# Patient Record
Sex: Male | Born: 1950 | Race: White | Hispanic: No | Smoking: Former smoker
Health system: Southern US, Community
[De-identification: ages and names within clinical notes are randomized; demographics above are authoritative.]

## PROBLEM LIST (undated history)

## (undated) DIAGNOSIS — E785 Hyperlipidemia, unspecified: Secondary | ICD-10-CM

## (undated) DIAGNOSIS — I10 Essential (primary) hypertension: Secondary | ICD-10-CM

## (undated) DIAGNOSIS — E669 Obesity, unspecified: Secondary | ICD-10-CM

## (undated) DIAGNOSIS — J9610 Chronic respiratory failure, unspecified whether with hypoxia or hypercapnia: Secondary | ICD-10-CM

## (undated) DIAGNOSIS — N183 Chronic kidney disease, stage 3 unspecified: Secondary | ICD-10-CM

## (undated) DIAGNOSIS — I272 Pulmonary hypertension, unspecified: Secondary | ICD-10-CM

## (undated) DIAGNOSIS — N289 Disorder of kidney and ureter, unspecified: Secondary | ICD-10-CM

## (undated) DIAGNOSIS — J984 Other disorders of lung: Secondary | ICD-10-CM

## (undated) DIAGNOSIS — M1990 Unspecified osteoarthritis, unspecified site: Secondary | ICD-10-CM

## (undated) DIAGNOSIS — I2510 Atherosclerotic heart disease of native coronary artery without angina pectoris: Secondary | ICD-10-CM

## (undated) DIAGNOSIS — F419 Anxiety disorder, unspecified: Secondary | ICD-10-CM

## (undated) DIAGNOSIS — I5022 Chronic systolic (congestive) heart failure: Secondary | ICD-10-CM

## (undated) DIAGNOSIS — Z86718 Personal history of other venous thrombosis and embolism: Secondary | ICD-10-CM

## (undated) DIAGNOSIS — E039 Hypothyroidism, unspecified: Secondary | ICD-10-CM

## (undated) DIAGNOSIS — I255 Ischemic cardiomyopathy: Secondary | ICD-10-CM

## (undated) DIAGNOSIS — E1140 Type 2 diabetes mellitus with diabetic neuropathy, unspecified: Secondary | ICD-10-CM

## (undated) DIAGNOSIS — D649 Anemia, unspecified: Secondary | ICD-10-CM

## (undated) DIAGNOSIS — J449 Chronic obstructive pulmonary disease, unspecified: Secondary | ICD-10-CM

## (undated) DIAGNOSIS — G4733 Obstructive sleep apnea (adult) (pediatric): Secondary | ICD-10-CM

## (undated) DIAGNOSIS — Z720 Tobacco use: Secondary | ICD-10-CM

## (undated) DIAGNOSIS — I4891 Unspecified atrial fibrillation: Secondary | ICD-10-CM

## (undated) HISTORY — DX: Personal history of other venous thrombosis and embolism: Z86.718

## (undated) HISTORY — DX: Unspecified atrial fibrillation: I48.91

## (undated) HISTORY — DX: Tobacco use: Z72.0

## (undated) HISTORY — DX: Anemia, unspecified: D64.9

## (undated) HISTORY — DX: Chronic obstructive pulmonary disease, unspecified: J44.9

## (undated) HISTORY — DX: Obesity, unspecified: E66.9

## (undated) HISTORY — DX: Ischemic cardiomyopathy: I25.5

## (undated) HISTORY — PX: SPINAL FUSION: SHX223

## (undated) HISTORY — DX: Anxiety disorder, unspecified: F41.9

## (undated) HISTORY — DX: Chronic kidney disease, stage 3 unspecified: N18.30

## (undated) HISTORY — DX: Hypothyroidism, unspecified: E03.9

## (undated) HISTORY — DX: Type 2 diabetes mellitus with diabetic neuropathy, unspecified: E11.40

## (undated) HISTORY — DX: Essential (primary) hypertension: I10

## (undated) HISTORY — DX: Pulmonary hypertension, unspecified: I27.20

## (undated) HISTORY — PX: CORONARY ARTERY BYPASS GRAFT: SHX141

## (undated) HISTORY — DX: Chronic systolic (congestive) heart failure: I50.22

## (undated) HISTORY — DX: Other disorders of lung: J98.4

## (undated) HISTORY — DX: Unspecified osteoarthritis, unspecified site: M19.90

## (undated) HISTORY — DX: Obstructive sleep apnea (adult) (pediatric): G47.33

## (undated) HISTORY — DX: Chronic kidney disease, stage 3 (moderate): N18.3

## (undated) HISTORY — DX: Chronic respiratory failure, unspecified whether with hypoxia or hypercapnia: J96.10

## (undated) HISTORY — DX: Hyperlipidemia, unspecified: E78.5

## (undated) HISTORY — DX: Atherosclerotic heart disease of native coronary artery without angina pectoris: I25.10

---
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
## 1999-03-25 ENCOUNTER — Inpatient Hospital Stay (HOSPITAL_COMMUNITY): Admission: AD | Admit: 1999-03-25 | Discharge: 1999-03-28 | Payer: Self-pay | Admitting: *Deleted

## 2000-03-11 ENCOUNTER — Encounter: Payer: Self-pay | Admitting: Internal Medicine

## 2000-03-11 ENCOUNTER — Ambulatory Visit (HOSPITAL_COMMUNITY): Admission: RE | Admit: 2000-03-11 | Discharge: 2000-03-11 | Payer: Self-pay | Admitting: Internal Medicine

## 2004-01-21 ENCOUNTER — Inpatient Hospital Stay (HOSPITAL_COMMUNITY): Admission: AD | Admit: 2004-01-21 | Discharge: 2004-01-28 | Payer: Self-pay | Admitting: *Deleted

## 2004-01-23 ENCOUNTER — Encounter: Payer: Self-pay | Admitting: Internal Medicine

## 2004-06-19 ENCOUNTER — Ambulatory Visit: Payer: Self-pay | Admitting: Cardiology

## 2004-09-27 HISTORY — PX: OTHER SURGICAL HISTORY: SHX169

## 2004-10-02 ENCOUNTER — Ambulatory Visit: Payer: Self-pay | Admitting: Internal Medicine

## 2004-10-02 ENCOUNTER — Inpatient Hospital Stay (HOSPITAL_COMMUNITY): Admission: AD | Admit: 2004-10-02 | Discharge: 2004-10-08 | Payer: Self-pay | Admitting: Surgery

## 2004-10-02 ENCOUNTER — Encounter (INDEPENDENT_AMBULATORY_CARE_PROVIDER_SITE_OTHER): Payer: Self-pay | Admitting: *Deleted

## 2005-07-01 ENCOUNTER — Ambulatory Visit: Payer: Self-pay | Admitting: Internal Medicine

## 2005-07-06 ENCOUNTER — Inpatient Hospital Stay (HOSPITAL_COMMUNITY): Admission: EM | Admit: 2005-07-06 | Discharge: 2005-07-20 | Payer: Self-pay | Admitting: Emergency Medicine

## 2005-07-06 ENCOUNTER — Ambulatory Visit: Payer: Self-pay | Admitting: Endocrinology

## 2005-07-07 ENCOUNTER — Ambulatory Visit: Payer: Self-pay | Admitting: Internal Medicine

## 2005-07-07 ENCOUNTER — Ambulatory Visit: Payer: Self-pay | Admitting: Cardiology

## 2005-07-07 ENCOUNTER — Encounter: Payer: Self-pay | Admitting: Cardiology

## 2005-07-27 ENCOUNTER — Ambulatory Visit: Payer: Self-pay | Admitting: Endocrinology

## 2005-08-10 ENCOUNTER — Ambulatory Visit: Payer: Self-pay | Admitting: Family Medicine

## 2005-08-12 ENCOUNTER — Ambulatory Visit: Payer: Self-pay | Admitting: Endocrinology

## 2005-08-12 ENCOUNTER — Ambulatory Visit: Payer: Self-pay | Admitting: Internal Medicine

## 2005-08-17 ENCOUNTER — Ambulatory Visit: Payer: Self-pay | Admitting: Endocrinology

## 2005-09-07 ENCOUNTER — Ambulatory Visit (HOSPITAL_COMMUNITY): Admission: RE | Admit: 2005-09-07 | Discharge: 2005-09-07 | Payer: Self-pay | Admitting: Family Medicine

## 2005-09-07 ENCOUNTER — Inpatient Hospital Stay (HOSPITAL_COMMUNITY): Admission: EM | Admit: 2005-09-07 | Discharge: 2005-09-08 | Payer: Self-pay | Admitting: Emergency Medicine

## 2005-09-07 ENCOUNTER — Ambulatory Visit: Payer: Self-pay | Admitting: Family Medicine

## 2005-09-07 ENCOUNTER — Ambulatory Visit: Payer: Self-pay | Admitting: Sports Medicine

## 2005-09-09 ENCOUNTER — Ambulatory Visit: Payer: Self-pay | Admitting: Cardiology

## 2005-09-16 ENCOUNTER — Ambulatory Visit: Payer: Self-pay | Admitting: Family Medicine

## 2005-09-22 ENCOUNTER — Ambulatory Visit: Payer: Self-pay | Admitting: Endocrinology

## 2005-09-27 DIAGNOSIS — I2510 Atherosclerotic heart disease of native coronary artery without angina pectoris: Secondary | ICD-10-CM

## 2005-09-27 HISTORY — DX: Atherosclerotic heart disease of native coronary artery without angina pectoris: I25.10

## 2005-12-23 ENCOUNTER — Ambulatory Visit: Payer: Self-pay | Admitting: Pulmonary Disease

## 2006-01-24 ENCOUNTER — Inpatient Hospital Stay (HOSPITAL_COMMUNITY): Admission: EM | Admit: 2006-01-24 | Discharge: 2006-02-02 | Payer: Self-pay | Admitting: *Deleted

## 2006-01-24 ENCOUNTER — Ambulatory Visit: Payer: Self-pay | Admitting: Family Medicine

## 2006-01-24 ENCOUNTER — Ambulatory Visit: Payer: Self-pay | Admitting: Cardiology

## 2006-01-28 ENCOUNTER — Encounter: Payer: Self-pay | Admitting: Cardiology

## 2006-02-08 ENCOUNTER — Ambulatory Visit: Payer: Self-pay | Admitting: Sports Medicine

## 2006-02-15 ENCOUNTER — Ambulatory Visit: Payer: Self-pay | Admitting: Cardiology

## 2006-02-22 ENCOUNTER — Ambulatory Visit: Payer: Self-pay | Admitting: Sports Medicine

## 2006-03-23 ENCOUNTER — Ambulatory Visit: Payer: Self-pay | Admitting: Family Medicine

## 2006-04-22 ENCOUNTER — Ambulatory Visit: Payer: Self-pay | Admitting: Pulmonary Disease

## 2006-05-02 ENCOUNTER — Ambulatory Visit: Payer: Self-pay | Admitting: Cardiology

## 2006-06-02 ENCOUNTER — Ambulatory Visit: Payer: Self-pay | Admitting: Cardiology

## 2006-09-07 ENCOUNTER — Ambulatory Visit: Payer: Self-pay | Admitting: Internal Medicine

## 2006-09-30 ENCOUNTER — Ambulatory Visit: Payer: Self-pay | Admitting: Internal Medicine

## 2006-10-08 ENCOUNTER — Ambulatory Visit: Payer: Self-pay | Admitting: Internal Medicine

## 2006-10-08 ENCOUNTER — Inpatient Hospital Stay (HOSPITAL_COMMUNITY): Admission: EM | Admit: 2006-10-08 | Discharge: 2006-10-13 | Payer: Self-pay | Admitting: Emergency Medicine

## 2006-10-10 ENCOUNTER — Encounter: Payer: Self-pay | Admitting: Internal Medicine

## 2006-11-24 DIAGNOSIS — I10 Essential (primary) hypertension: Secondary | ICD-10-CM

## 2006-11-24 DIAGNOSIS — E118 Type 2 diabetes mellitus with unspecified complications: Secondary | ICD-10-CM

## 2006-11-24 DIAGNOSIS — E1165 Type 2 diabetes mellitus with hyperglycemia: Secondary | ICD-10-CM

## 2006-11-24 DIAGNOSIS — E039 Hypothyroidism, unspecified: Secondary | ICD-10-CM | POA: Insufficient documentation

## 2007-02-10 ENCOUNTER — Ambulatory Visit: Payer: Self-pay | Admitting: Cardiology

## 2007-02-24 ENCOUNTER — Ambulatory Visit: Payer: Self-pay

## 2007-02-28 ENCOUNTER — Ambulatory Visit: Payer: Self-pay

## 2007-03-09 ENCOUNTER — Ambulatory Visit: Payer: Self-pay | Admitting: Cardiology

## 2007-04-15 ENCOUNTER — Emergency Department (HOSPITAL_COMMUNITY): Admission: EM | Admit: 2007-04-15 | Discharge: 2007-04-15 | Payer: Self-pay | Admitting: Emergency Medicine

## 2007-05-09 ENCOUNTER — Ambulatory Visit: Payer: Self-pay | Admitting: Cardiology

## 2007-05-09 LAB — CONVERTED CEMR LAB
BUN: 13 mg/dL
CO2: 31 meq/L
Calcium: 9 mg/dL
Chloride: 108 meq/L
Creatinine, Ser: 0.9 mg/dL
GFR calc Af Amer: 112 mL/min
GFR calc non Af Amer: 92 mL/min
Glucose, Bld: 169 mg/dL — ABNORMAL HIGH
Potassium: 4 meq/L
Sodium: 144 meq/L

## 2007-08-03 ENCOUNTER — Ambulatory Visit: Payer: Self-pay | Admitting: Cardiology

## 2007-08-10 ENCOUNTER — Ambulatory Visit: Payer: Self-pay | Admitting: Internal Medicine

## 2007-08-10 LAB — CONVERTED CEMR LAB
Basophils Relative: 2.2 % — ABNORMAL HIGH (ref 0.0–1.0)
CO2: 32 meq/L (ref 19–32)
Creatinine, Ser: 1 mg/dL (ref 0.4–1.5)
Glucose, Bld: 193 mg/dL — ABNORMAL HIGH (ref 70–99)
HCT: 38.7 % — ABNORMAL LOW (ref 39.0–52.0)
Hemoglobin: 13.6 g/dL (ref 13.0–17.0)
Monocytes Absolute: 0.4 10*3/uL (ref 0.2–0.7)
Neutrophils Relative %: 77.1 % — ABNORMAL HIGH (ref 43.0–77.0)
Potassium: 4.5 meq/L (ref 3.5–5.1)
RDW: 12.9 % (ref 11.5–14.6)
Sodium: 142 meq/L (ref 135–145)

## 2008-05-07 ENCOUNTER — Encounter: Payer: Self-pay | Admitting: *Deleted

## 2008-05-12 ENCOUNTER — Ambulatory Visit: Payer: Self-pay | Admitting: Cardiovascular Disease

## 2008-05-12 ENCOUNTER — Ambulatory Visit: Payer: Self-pay | Admitting: Family Medicine

## 2008-05-13 ENCOUNTER — Inpatient Hospital Stay (HOSPITAL_COMMUNITY): Admission: EM | Admit: 2008-05-13 | Discharge: 2008-05-15 | Payer: Self-pay | Admitting: Emergency Medicine

## 2008-05-14 ENCOUNTER — Telehealth (INDEPENDENT_AMBULATORY_CARE_PROVIDER_SITE_OTHER): Payer: Self-pay | Admitting: Family Medicine

## 2008-05-15 ENCOUNTER — Encounter: Payer: Self-pay | Admitting: Family Medicine

## 2008-05-16 ENCOUNTER — Telehealth: Payer: Self-pay | Admitting: *Deleted

## 2008-05-22 ENCOUNTER — Ambulatory Visit: Payer: Self-pay | Admitting: Family Medicine

## 2008-05-30 ENCOUNTER — Ambulatory Visit: Payer: Self-pay | Admitting: Cardiology

## 2008-06-07 ENCOUNTER — Encounter: Payer: Self-pay | Admitting: Internal Medicine

## 2008-06-19 ENCOUNTER — Encounter (INDEPENDENT_AMBULATORY_CARE_PROVIDER_SITE_OTHER): Payer: Self-pay | Admitting: Family Medicine

## 2008-06-19 ENCOUNTER — Ambulatory Visit: Payer: Self-pay | Admitting: Family Medicine

## 2008-06-19 DIAGNOSIS — E785 Hyperlipidemia, unspecified: Secondary | ICD-10-CM

## 2008-06-20 ENCOUNTER — Encounter (INDEPENDENT_AMBULATORY_CARE_PROVIDER_SITE_OTHER): Payer: Self-pay | Admitting: Family Medicine

## 2008-06-21 LAB — CONVERTED CEMR LAB
HDL: 26 mg/dL — ABNORMAL LOW (ref >39)
LDL Cholesterol: 61 mg/dL (ref 0–99)
Triglycerides: 108 mg/dL (ref <150)
VLDL: 22 mg/dL (ref 0–40)

## 2008-06-27 ENCOUNTER — Encounter (INDEPENDENT_AMBULATORY_CARE_PROVIDER_SITE_OTHER): Payer: Self-pay | Admitting: Family Medicine

## 2008-09-17 ENCOUNTER — Encounter (INDEPENDENT_AMBULATORY_CARE_PROVIDER_SITE_OTHER): Payer: Self-pay | Admitting: Family Medicine

## 2008-09-23 ENCOUNTER — Encounter (INDEPENDENT_AMBULATORY_CARE_PROVIDER_SITE_OTHER): Payer: Self-pay | Admitting: Family Medicine

## 2008-09-23 ENCOUNTER — Ambulatory Visit: Payer: Self-pay | Admitting: Family Medicine

## 2008-09-23 LAB — CONVERTED CEMR LAB
Albumin: 3.9 g/dL (ref 3.5–5.2)
Alkaline Phosphatase: 103 units/L (ref 39–117)
BUN: 29 mg/dL — ABNORMAL HIGH (ref 6–23)
Glucose, Bld: 502 mg/dL (ref 70–99)
Potassium: 5.2 meq/L (ref 3.5–5.3)
Total Bilirubin: 0.4 mg/dL (ref 0.3–1.2)

## 2008-10-01 ENCOUNTER — Encounter (INDEPENDENT_AMBULATORY_CARE_PROVIDER_SITE_OTHER): Payer: Self-pay | Admitting: Family Medicine

## 2008-10-17 ENCOUNTER — Ambulatory Visit: Payer: Self-pay | Admitting: Cardiology

## 2008-12-25 ENCOUNTER — Ambulatory Visit: Payer: Self-pay | Admitting: Internal Medicine

## 2008-12-25 ENCOUNTER — Inpatient Hospital Stay (HOSPITAL_COMMUNITY): Admission: EM | Admit: 2008-12-25 | Discharge: 2009-01-09 | Payer: Self-pay | Admitting: Emergency Medicine

## 2009-01-03 ENCOUNTER — Ambulatory Visit: Payer: Self-pay | Admitting: *Deleted

## 2009-01-03 ENCOUNTER — Encounter: Payer: Self-pay | Admitting: Critical Care Medicine

## 2009-01-10 ENCOUNTER — Ambulatory Visit: Payer: Self-pay | Admitting: Internal Medicine

## 2009-01-14 ENCOUNTER — Ambulatory Visit: Payer: Self-pay | Admitting: Cardiology

## 2009-01-14 LAB — CONVERTED CEMR LAB
GFR calc non Af Amer: 104.92 mL/min (ref >60)
Glucose, Bld: 141 mg/dL — ABNORMAL HIGH (ref 70–99)
Potassium: 4.3 meq/L (ref 3.5–5.1)
Sodium: 144 meq/L (ref 135–145)

## 2009-01-21 DIAGNOSIS — I2510 Atherosclerotic heart disease of native coronary artery without angina pectoris: Secondary | ICD-10-CM | POA: Insufficient documentation

## 2009-01-21 DIAGNOSIS — I2589 Other forms of chronic ischemic heart disease: Secondary | ICD-10-CM

## 2009-01-22 ENCOUNTER — Ambulatory Visit: Payer: Self-pay | Admitting: Cardiology

## 2009-01-22 ENCOUNTER — Encounter: Payer: Self-pay | Admitting: Physician Assistant

## 2009-01-22 DIAGNOSIS — R079 Chest pain, unspecified: Secondary | ICD-10-CM | POA: Insufficient documentation

## 2009-01-22 DIAGNOSIS — I82409 Acute embolism and thrombosis of unspecified deep veins of unspecified lower extremity: Secondary | ICD-10-CM | POA: Insufficient documentation

## 2009-01-22 LAB — CONVERTED CEMR LAB
CO2: 29 meq/L (ref 19–32)
Chloride: 110 meq/L (ref 96–112)
Creatinine, Ser: 0.9 mg/dL (ref 0.4–1.5)
Nitrite: NEGATIVE
Sodium: 146 meq/L — ABNORMAL HIGH (ref 135–145)
Specific Gravity, Urine: 1.02 (ref 1.000–1.030)
Total Protein, Urine: 100 mg/dL
pH: 5 (ref 5.0–8.0)

## 2009-01-23 ENCOUNTER — Telehealth: Payer: Self-pay | Admitting: Cardiology

## 2009-01-23 ENCOUNTER — Encounter: Payer: Self-pay | Admitting: Cardiology

## 2009-01-24 ENCOUNTER — Ambulatory Visit: Payer: Self-pay | Admitting: Cardiology

## 2009-01-27 ENCOUNTER — Ambulatory Visit: Payer: Self-pay | Admitting: Cardiology

## 2009-01-27 DIAGNOSIS — I5022 Chronic systolic (congestive) heart failure: Secondary | ICD-10-CM

## 2009-01-31 ENCOUNTER — Ambulatory Visit: Payer: Self-pay | Admitting: *Deleted

## 2009-01-31 ENCOUNTER — Ambulatory Visit (HOSPITAL_COMMUNITY): Admission: RE | Admit: 2009-01-31 | Discharge: 2009-01-31 | Payer: Self-pay | Admitting: Critical Care Medicine

## 2009-01-31 ENCOUNTER — Ambulatory Visit: Payer: Self-pay | Admitting: Cardiovascular Disease

## 2009-01-31 ENCOUNTER — Encounter: Payer: Self-pay | Admitting: Critical Care Medicine

## 2009-02-04 ENCOUNTER — Ambulatory Visit: Payer: Self-pay | Admitting: Cardiology

## 2009-02-05 LAB — CONVERTED CEMR LAB
CO2: 34 meq/L — ABNORMAL HIGH (ref 19–32)
Chloride: 101 meq/L (ref 96–112)
Sodium: 144 meq/L (ref 135–145)

## 2009-02-06 ENCOUNTER — Encounter (INDEPENDENT_AMBULATORY_CARE_PROVIDER_SITE_OTHER): Payer: Self-pay | Admitting: Family Medicine

## 2009-02-07 LAB — CONVERTED CEMR LAB
BUN: 17 mg/dL
CO2: 30 meq/L
Calcium: 9.2 mg/dL
Chloride: 103 meq/L
Creatinine, Ser: 0.9 mg/dL
GFR calc non Af Amer: 91.58 mL/min
Glucose, Bld: 221 mg/dL — ABNORMAL HIGH
Potassium: 4.3 meq/L
Sodium: 139 meq/L

## 2009-02-14 ENCOUNTER — Ambulatory Visit: Payer: Self-pay | Admitting: Internal Medicine

## 2009-02-25 ENCOUNTER — Encounter: Payer: Self-pay | Admitting: *Deleted

## 2009-03-04 ENCOUNTER — Ambulatory Visit: Payer: Self-pay | Admitting: Cardiology

## 2009-03-04 LAB — CONVERTED CEMR LAB: POC INR: 3

## 2009-03-25 ENCOUNTER — Ambulatory Visit: Payer: Self-pay | Admitting: Cardiology

## 2009-03-25 ENCOUNTER — Encounter: Payer: Self-pay | Admitting: Family Medicine

## 2009-03-25 ENCOUNTER — Encounter (INDEPENDENT_AMBULATORY_CARE_PROVIDER_SITE_OTHER): Payer: Self-pay | Admitting: Pharmacist

## 2009-03-25 LAB — CONVERTED CEMR LAB
Chloride: 103 meq/L (ref 96–112)
Creatinine, Ser: 1.1 mg/dL (ref 0.4–1.5)
GFR calc non Af Amer: 72.61 mL/min (ref >60)
POC INR: 1.3
Potassium: 4.6 meq/L (ref 3.5–5.1)
Prothrombin Time: 14.2 s

## 2009-04-02 ENCOUNTER — Encounter: Payer: Self-pay | Admitting: *Deleted

## 2009-05-07 ENCOUNTER — Ambulatory Visit: Payer: Self-pay | Admitting: Internal Medicine

## 2009-05-07 LAB — CONVERTED CEMR LAB: POC INR: 2.3

## 2009-06-04 ENCOUNTER — Ambulatory Visit: Payer: Self-pay | Admitting: Internal Medicine

## 2009-06-04 LAB — CONVERTED CEMR LAB: POC INR: 1.4

## 2009-06-25 ENCOUNTER — Encounter (INDEPENDENT_AMBULATORY_CARE_PROVIDER_SITE_OTHER): Payer: Self-pay | Admitting: *Deleted

## 2009-06-26 ENCOUNTER — Encounter (INDEPENDENT_AMBULATORY_CARE_PROVIDER_SITE_OTHER): Payer: Self-pay | Admitting: Pharmacist

## 2009-07-05 ENCOUNTER — Encounter (INDEPENDENT_AMBULATORY_CARE_PROVIDER_SITE_OTHER): Payer: Self-pay | Admitting: *Deleted

## 2009-07-16 ENCOUNTER — Encounter (INDEPENDENT_AMBULATORY_CARE_PROVIDER_SITE_OTHER): Payer: Self-pay | Admitting: Cardiology

## 2009-08-05 ENCOUNTER — Encounter: Payer: Self-pay | Admitting: Family Medicine

## 2009-08-05 LAB — CONVERTED CEMR LAB
Calcium: 9.2 mg/dL
Creatinine, Ser: 1.2 mg/dL
Glucose, Bld: 586 mg/dL
INR: 1.99
Sodium: 135 meq/L

## 2009-08-07 ENCOUNTER — Encounter: Payer: Self-pay | Admitting: *Deleted

## 2009-08-12 ENCOUNTER — Encounter (INDEPENDENT_AMBULATORY_CARE_PROVIDER_SITE_OTHER): Payer: Self-pay | Admitting: Pharmacist

## 2009-08-12 ENCOUNTER — Encounter: Payer: Self-pay | Admitting: Family Medicine

## 2009-08-25 ENCOUNTER — Ambulatory Visit: Payer: Self-pay | Admitting: Cardiology

## 2009-08-25 LAB — CONVERTED CEMR LAB: POC INR: 2.5

## 2009-09-10 ENCOUNTER — Telehealth (INDEPENDENT_AMBULATORY_CARE_PROVIDER_SITE_OTHER): Payer: Self-pay | Admitting: *Deleted

## 2009-09-15 ENCOUNTER — Ambulatory Visit: Payer: Self-pay | Admitting: Cardiology

## 2009-09-15 ENCOUNTER — Encounter (INDEPENDENT_AMBULATORY_CARE_PROVIDER_SITE_OTHER): Payer: Self-pay | Admitting: Cardiology

## 2009-09-15 ENCOUNTER — Encounter (HOSPITAL_COMMUNITY): Admission: RE | Admit: 2009-09-15 | Discharge: 2009-09-24 | Payer: Self-pay | Admitting: Cardiology

## 2009-09-15 ENCOUNTER — Ambulatory Visit: Payer: Self-pay

## 2009-09-16 ENCOUNTER — Encounter: Payer: Self-pay | Admitting: Cardiology

## 2009-09-18 ENCOUNTER — Encounter: Payer: Self-pay | Admitting: Cardiology

## 2009-09-23 ENCOUNTER — Ambulatory Visit: Payer: Self-pay | Admitting: Cardiology

## 2009-10-14 ENCOUNTER — Ambulatory Visit: Payer: Self-pay | Admitting: Cardiology

## 2009-10-14 LAB — CONVERTED CEMR LAB: POC INR: 1.2

## 2009-10-28 ENCOUNTER — Ambulatory Visit: Payer: Self-pay | Admitting: Internal Medicine

## 2009-11-12 ENCOUNTER — Ambulatory Visit: Payer: Self-pay | Admitting: Cardiology

## 2009-11-21 ENCOUNTER — Encounter: Payer: Self-pay | Admitting: *Deleted

## 2009-12-01 ENCOUNTER — Ambulatory Visit: Payer: Self-pay | Admitting: Internal Medicine

## 2009-12-01 LAB — CONVERTED CEMR LAB: POC INR: 1.6

## 2009-12-12 ENCOUNTER — Encounter: Payer: Self-pay | Admitting: *Deleted

## 2009-12-12 ENCOUNTER — Ambulatory Visit: Payer: Self-pay | Admitting: Family Medicine

## 2009-12-12 LAB — CONVERTED CEMR LAB: Hgb A1c MFr Bld: >14 %

## 2009-12-22 ENCOUNTER — Ambulatory Visit: Payer: Self-pay | Admitting: Internal Medicine

## 2010-01-12 ENCOUNTER — Ambulatory Visit: Payer: Self-pay | Admitting: Cardiology

## 2010-01-12 LAB — CONVERTED CEMR LAB: POC INR: 1.3

## 2010-01-14 ENCOUNTER — Ambulatory Visit: Payer: Self-pay | Admitting: Family Medicine

## 2010-02-10 ENCOUNTER — Ambulatory Visit: Payer: Self-pay | Admitting: Family Medicine

## 2010-03-13 ENCOUNTER — Ambulatory Visit: Payer: Self-pay | Admitting: Cardiology

## 2010-03-13 ENCOUNTER — Ambulatory Visit: Payer: Self-pay | Admitting: Family Medicine

## 2010-03-13 LAB — CONVERTED CEMR LAB
Hgb A1c MFr Bld: >14 %
POC INR: 1.1

## 2010-04-07 ENCOUNTER — Encounter: Payer: Self-pay | Admitting: Family Medicine

## 2010-04-07 ENCOUNTER — Ambulatory Visit: Payer: Self-pay | Admitting: Family Medicine

## 2010-04-08 ENCOUNTER — Ambulatory Visit: Payer: Self-pay | Admitting: Family Medicine

## 2010-04-10 ENCOUNTER — Encounter: Payer: Self-pay | Admitting: Cardiology

## 2010-04-14 ENCOUNTER — Ambulatory Visit: Payer: Self-pay | Admitting: Internal Medicine

## 2010-04-14 ENCOUNTER — Ambulatory Visit: Payer: Self-pay | Admitting: Cardiology

## 2010-04-24 ENCOUNTER — Ambulatory Visit: Payer: Self-pay | Admitting: Cardiology

## 2010-04-24 ENCOUNTER — Ambulatory Visit: Payer: Self-pay | Admitting: Family Medicine

## 2010-04-24 LAB — CONVERTED CEMR LAB: POC INR: 1.1

## 2010-04-27 ENCOUNTER — Encounter: Payer: Self-pay | Admitting: *Deleted

## 2010-04-28 ENCOUNTER — Encounter: Payer: Self-pay | Admitting: Cardiology

## 2010-04-28 LAB — CONVERTED CEMR LAB
ALT: 17 units/L (ref 0–53)
AST: 18 units/L (ref 0–37)
Albumin: 3.4 g/dL — ABNORMAL LOW (ref 3.5–5.2)
Alkaline Phosphatase: 86 units/L (ref 39–117)
Basophils Relative: 0.6 % (ref 0.0–3.0)
Calcium: 8.7 mg/dL (ref 8.4–10.5)
Cholesterol: 101 mg/dL (ref 0–200)
Creatinine, Ser: 0.9 mg/dL (ref 0.4–1.5)
Eosinophils Absolute: 0.2 10*3/uL (ref 0.0–0.7)
Eosinophils Relative: 3.1 % (ref 0.0–5.0)
LDL Cholesterol: 53 mg/dL (ref 0–99)
Lymphocytes Relative: 23.8 % (ref 12.0–46.0)
MCHC: 34.8 g/dL (ref 30.0–36.0)
Neutrophils Relative %: 66 % (ref 43.0–77.0)
PSA: 0.5 ng/mL (ref 0.10–4.00)
RBC: 3.99 M/uL — ABNORMAL LOW (ref 4.22–5.81)
Total CHOL/HDL Ratio: 4
Total Protein: 5.8 g/dL — ABNORMAL LOW (ref 6.0–8.3)
WBC: 7.6 10*3/uL (ref 4.5–10.5)

## 2010-05-08 ENCOUNTER — Ambulatory Visit: Payer: Self-pay | Admitting: Cardiology

## 2010-05-08 LAB — CONVERTED CEMR LAB: POC INR: 1.9

## 2010-05-20 ENCOUNTER — Inpatient Hospital Stay (HOSPITAL_COMMUNITY): Admission: EM | Admit: 2010-05-20 | Discharge: 2010-05-25 | Payer: Self-pay | Admitting: Emergency Medicine

## 2010-05-20 ENCOUNTER — Ambulatory Visit: Payer: Self-pay | Admitting: Pulmonary Disease

## 2010-05-20 ENCOUNTER — Ambulatory Visit: Payer: Self-pay | Admitting: Cardiology

## 2010-05-28 ENCOUNTER — Ambulatory Visit: Payer: Self-pay | Admitting: Internal Medicine

## 2010-05-28 DIAGNOSIS — G4733 Obstructive sleep apnea (adult) (pediatric): Secondary | ICD-10-CM

## 2010-05-28 DIAGNOSIS — Z720 Tobacco use: Secondary | ICD-10-CM

## 2010-05-28 HISTORY — DX: Tobacco use: Z72.0

## 2010-05-28 HISTORY — DX: Obstructive sleep apnea (adult) (pediatric): G47.33

## 2010-06-05 ENCOUNTER — Encounter: Payer: Self-pay | Admitting: Cardiology

## 2010-06-17 ENCOUNTER — Ambulatory Visit: Payer: Self-pay | Admitting: Internal Medicine

## 2010-06-19 ENCOUNTER — Encounter (INDEPENDENT_AMBULATORY_CARE_PROVIDER_SITE_OTHER): Payer: Self-pay | Admitting: *Deleted

## 2010-06-23 ENCOUNTER — Encounter: Payer: Self-pay | Admitting: Internal Medicine

## 2010-06-23 ENCOUNTER — Encounter: Payer: Self-pay | Admitting: Pulmonary Disease

## 2010-06-23 ENCOUNTER — Ambulatory Visit (HOSPITAL_BASED_OUTPATIENT_CLINIC_OR_DEPARTMENT_OTHER): Admission: RE | Admit: 2010-06-23 | Discharge: 2010-06-23 | Payer: Self-pay | Admitting: Internal Medicine

## 2010-06-26 ENCOUNTER — Ambulatory Visit: Payer: Self-pay | Admitting: Pulmonary Disease

## 2010-07-01 ENCOUNTER — Encounter: Payer: Self-pay | Admitting: Internal Medicine

## 2010-07-01 DIAGNOSIS — G4733 Obstructive sleep apnea (adult) (pediatric): Secondary | ICD-10-CM | POA: Insufficient documentation

## 2010-07-06 ENCOUNTER — Encounter: Payer: Self-pay | Admitting: Internal Medicine

## 2010-07-16 ENCOUNTER — Telehealth: Payer: Self-pay | Admitting: Pulmonary Disease

## 2010-07-17 ENCOUNTER — Telehealth: Payer: Self-pay | Admitting: Family Medicine

## 2010-07-19 ENCOUNTER — Encounter: Payer: Self-pay | Admitting: Family Medicine

## 2010-07-22 ENCOUNTER — Telehealth: Payer: Self-pay | Admitting: Internal Medicine

## 2010-07-30 ENCOUNTER — Ambulatory Visit (HOSPITAL_BASED_OUTPATIENT_CLINIC_OR_DEPARTMENT_OTHER)
Admission: RE | Admit: 2010-07-30 | Discharge: 2010-07-30 | Payer: Self-pay | Source: Home / Self Care | Admitting: Internal Medicine

## 2010-08-06 ENCOUNTER — Encounter: Payer: Self-pay | Admitting: Family Medicine

## 2010-08-13 ENCOUNTER — Ambulatory Visit: Payer: Self-pay | Admitting: Internal Medicine

## 2010-08-26 ENCOUNTER — Inpatient Hospital Stay (HOSPITAL_COMMUNITY): Admission: AD | Admit: 2010-08-26 | Discharge: 2010-09-01 | Payer: Self-pay | Admitting: Family Medicine

## 2010-08-26 ENCOUNTER — Ambulatory Visit: Payer: Self-pay | Admitting: Internal Medicine

## 2010-08-26 ENCOUNTER — Ambulatory Visit: Payer: Self-pay | Admitting: Family Medicine

## 2010-08-26 ENCOUNTER — Encounter: Payer: Self-pay | Admitting: Family Medicine

## 2010-08-26 ENCOUNTER — Ambulatory Visit (HOSPITAL_COMMUNITY)
Admission: RE | Admit: 2010-08-26 | Discharge: 2010-08-26 | Payer: Self-pay | Source: Home / Self Care | Admitting: Family Medicine

## 2010-08-26 DIAGNOSIS — R05 Cough: Secondary | ICD-10-CM

## 2010-08-26 LAB — CONVERTED CEMR LAB: INR: 1

## 2010-08-27 ENCOUNTER — Encounter: Payer: Self-pay | Admitting: Family Medicine

## 2010-09-03 ENCOUNTER — Ambulatory Visit: Payer: Self-pay | Admitting: Family Medicine

## 2010-09-03 ENCOUNTER — Encounter: Payer: Self-pay | Admitting: Family Medicine

## 2010-09-04 ENCOUNTER — Ambulatory Visit: Payer: Self-pay | Admitting: Cardiology

## 2010-09-04 ENCOUNTER — Encounter: Payer: Self-pay | Admitting: Cardiology

## 2010-09-05 LAB — CONVERTED CEMR LAB
BUN: 37 mg/dL — ABNORMAL HIGH (ref 6–23)
CO2: 31 meq/L (ref 19–32)
Calcium: 9.1 mg/dL (ref 8.4–10.5)
Chloride: 101 meq/L (ref 96–112)
Creatinine, Ser: 1.1 mg/dL (ref 0.4–1.5)
Pro B Natriuretic peptide (BNP): 437 pg/mL — ABNORMAL HIGH (ref 0.0–100.0)

## 2010-09-08 ENCOUNTER — Telehealth: Payer: Self-pay | Admitting: Family Medicine

## 2010-09-09 ENCOUNTER — Telehealth (INDEPENDENT_AMBULATORY_CARE_PROVIDER_SITE_OTHER): Payer: Self-pay | Admitting: Radiology

## 2010-09-10 ENCOUNTER — Ambulatory Visit: Payer: Self-pay

## 2010-09-10 ENCOUNTER — Encounter: Payer: Self-pay | Admitting: Cardiovascular Disease

## 2010-09-10 ENCOUNTER — Encounter (HOSPITAL_COMMUNITY)
Admission: RE | Admit: 2010-09-10 | Discharge: 2010-10-27 | Payer: Self-pay | Source: Home / Self Care | Attending: Cardiology | Admitting: Cardiology

## 2010-09-11 ENCOUNTER — Encounter: Payer: Self-pay | Admitting: Family Medicine

## 2010-09-11 ENCOUNTER — Inpatient Hospital Stay (HOSPITAL_COMMUNITY)
Admission: EM | Admit: 2010-09-11 | Discharge: 2010-09-15 | Payer: Self-pay | Source: Home / Self Care | Attending: Family Medicine | Admitting: Family Medicine

## 2010-09-11 DIAGNOSIS — N179 Acute kidney failure, unspecified: Secondary | ICD-10-CM

## 2010-09-17 ENCOUNTER — Encounter: Payer: Self-pay | Admitting: Family Medicine

## 2010-09-17 ENCOUNTER — Ambulatory Visit: Payer: Self-pay | Admitting: Family Medicine

## 2010-09-17 LAB — CONVERTED CEMR LAB
BUN: 45 mg/dL — ABNORMAL HIGH (ref 6–23)
CO2: 24 meq/L (ref 19–32)
Chloride: 106 meq/L (ref 96–112)
Creatinine, Ser: 1.58 mg/dL — ABNORMAL HIGH (ref 0.40–1.50)
Potassium: 4.6 meq/L (ref 3.5–5.3)

## 2010-09-18 ENCOUNTER — Ambulatory Visit: Payer: Self-pay | Admitting: Internal Medicine

## 2010-09-22 ENCOUNTER — Encounter: Payer: Self-pay | Admitting: Internal Medicine

## 2010-09-22 ENCOUNTER — Telehealth: Payer: Self-pay | Admitting: Family Medicine

## 2010-09-23 ENCOUNTER — Telehealth: Payer: Self-pay | Admitting: Cardiology

## 2010-09-23 ENCOUNTER — Telehealth: Payer: Self-pay | Admitting: Family Medicine

## 2010-09-24 ENCOUNTER — Ambulatory Visit (HOSPITAL_BASED_OUTPATIENT_CLINIC_OR_DEPARTMENT_OTHER): Admission: RE | Admit: 2010-09-24 | Payer: Self-pay | Source: Home / Self Care | Admitting: Pulmonary Disease

## 2010-09-24 ENCOUNTER — Ambulatory Visit
Admission: RE | Admit: 2010-09-24 | Discharge: 2010-09-24 | Payer: Self-pay | Source: Home / Self Care | Attending: Family Medicine | Admitting: Family Medicine

## 2010-09-26 ENCOUNTER — Telehealth: Payer: Self-pay | Admitting: Nurse Practitioner

## 2010-09-29 ENCOUNTER — Telehealth: Payer: Self-pay | Admitting: Cardiology

## 2010-09-29 ENCOUNTER — Telehealth: Payer: Self-pay | Admitting: Family Medicine

## 2010-09-29 ENCOUNTER — Encounter (INDEPENDENT_AMBULATORY_CARE_PROVIDER_SITE_OTHER): Payer: Self-pay | Admitting: *Deleted

## 2010-10-02 ENCOUNTER — Telehealth: Payer: Self-pay | Admitting: Family Medicine

## 2010-10-02 ENCOUNTER — Ambulatory Visit: Admit: 2010-10-02 | Payer: Self-pay

## 2010-10-05 ENCOUNTER — Encounter: Payer: Self-pay | Admitting: Family Medicine

## 2010-10-05 ENCOUNTER — Telehealth: Payer: Self-pay | Admitting: Cardiology

## 2010-10-07 ENCOUNTER — Ambulatory Visit: Admission: RE | Admit: 2010-10-07 | Discharge: 2010-10-07 | Payer: Self-pay | Source: Home / Self Care

## 2010-10-07 ENCOUNTER — Encounter: Payer: Self-pay | Admitting: Family Medicine

## 2010-10-07 ENCOUNTER — Encounter: Payer: Self-pay | Admitting: Cardiology

## 2010-10-07 ENCOUNTER — Inpatient Hospital Stay (HOSPITAL_COMMUNITY)
Admission: AD | Admit: 2010-10-07 | Discharge: 2010-10-10 | Payer: Self-pay | Source: Home / Self Care | Attending: Family Medicine | Admitting: Family Medicine

## 2010-10-07 DIAGNOSIS — R339 Retention of urine, unspecified: Secondary | ICD-10-CM | POA: Insufficient documentation

## 2010-10-10 ENCOUNTER — Encounter: Payer: Self-pay | Admitting: Family Medicine

## 2010-10-12 LAB — CARDIAC PANEL(CRET KIN+CKTOT+MB+TROPI)
CK, MB: 7.5 ng/mL (ref 0.3–4.0)
CK, MB: 5.3 ng/mL — ABNORMAL HIGH (ref 0.3–4.0)
CK, MB: 3.5 ng/mL (ref 0.3–4.0)
Relative Index: 5.1 — ABNORMAL HIGH (ref 0.0–2.5)
Relative Index: 4.7 — ABNORMAL HIGH (ref 0.0–2.5)
Relative Index: INVALID (ref 0.0–2.5)
Total CK: 147 U/L (ref 7–232)
Total CK: 112 U/L (ref 7–232)
Total CK: 90 U/L (ref 7–232)
Troponin I: 0.63 ng/mL (ref 0.00–0.06)
Troponin I: 0.47 ng/mL — ABNORMAL HIGH (ref 0.00–0.06)
Troponin I: 0.4 ng/mL — ABNORMAL HIGH (ref 0.00–0.06)

## 2010-10-12 LAB — FOLATE: Folate: 18.4 ng/mL

## 2010-10-12 LAB — GLUCOSE, CAPILLARY
Glucose-Capillary: 170 mg/dL — ABNORMAL HIGH (ref 70–99)
Glucose-Capillary: 136 mg/dL — ABNORMAL HIGH (ref 70–99)
Glucose-Capillary: 204 mg/dL — ABNORMAL HIGH (ref 70–99)
Glucose-Capillary: 89 mg/dL (ref 70–99)
Glucose-Capillary: 135 mg/dL — ABNORMAL HIGH (ref 70–99)
Glucose-Capillary: 109 mg/dL — ABNORMAL HIGH (ref 70–99)
Glucose-Capillary: 176 mg/dL — ABNORMAL HIGH (ref 70–99)
Glucose-Capillary: 89 mg/dL (ref 70–99)
Glucose-Capillary: 130 mg/dL — ABNORMAL HIGH (ref 70–99)
Glucose-Capillary: 108 mg/dL — ABNORMAL HIGH (ref 70–99)
Glucose-Capillary: 99 mg/dL (ref 70–99)
Glucose-Capillary: 72 mg/dL (ref 70–99)
Glucose-Capillary: 103 mg/dL — ABNORMAL HIGH (ref 70–99)

## 2010-10-12 LAB — PROTIME-INR
INR: 1.95 — ABNORMAL HIGH (ref 0.00–1.49)
INR: 2.08 — ABNORMAL HIGH (ref 0.00–1.49)
INR: 1.79 — ABNORMAL HIGH (ref 0.00–1.49)
INR: 1.44 (ref 0.00–1.49)
Prothrombin Time: 22.4 seconds — ABNORMAL HIGH (ref 11.6–15.2)
Prothrombin Time: 23.5 seconds — ABNORMAL HIGH (ref 11.6–15.2)
Prothrombin Time: 21 seconds — ABNORMAL HIGH (ref 11.6–15.2)
Prothrombin Time: 17.7 seconds — ABNORMAL HIGH (ref 11.6–15.2)

## 2010-10-12 LAB — BASIC METABOLIC PANEL
BUN: 19 mg/dL (ref 6–23)
BUN: 20 mg/dL (ref 6–23)
BUN: 21 mg/dL (ref 6–23)
BUN: 16 mg/dL (ref 6–23)
CO2: 24 mEq/L (ref 19–32)
CO2: 23 mEq/L (ref 19–32)
CO2: 30 mEq/L (ref 19–32)
CO2: 30 mEq/L (ref 19–32)
Calcium: 8.6 mg/dL (ref 8.4–10.5)
Calcium: 8.3 mg/dL — ABNORMAL LOW (ref 8.4–10.5)
Calcium: 9 mg/dL (ref 8.4–10.5)
Calcium: 8.6 mg/dL (ref 8.4–10.5)
Chloride: 109 mEq/L (ref 96–112)
Chloride: 108 mEq/L (ref 96–112)
Chloride: 105 mEq/L (ref 96–112)
Chloride: 103 mEq/L (ref 96–112)
Creatinine, Ser: 0.95 mg/dL (ref 0.4–1.5)
Creatinine, Ser: 1 mg/dL (ref 0.4–1.5)
Creatinine, Ser: 1.07 mg/dL (ref 0.4–1.5)
Creatinine, Ser: 0.99 mg/dL (ref 0.4–1.5)
GFR calc Af Amer: >60 mL/min (ref >60)
GFR calc Af Amer: >60 mL/min (ref >60)
GFR calc Af Amer: >60 mL/min (ref >60)
GFR calc Af Amer: >60 mL/min (ref >60)
GFR calc non Af Amer: >60 mL/min (ref >60)
GFR calc non Af Amer: >60 mL/min (ref >60)
GFR calc non Af Amer: >60 mL/min (ref >60)
GFR calc non Af Amer: >60 mL/min (ref >60)
Glucose, Bld: 159 mg/dL — ABNORMAL HIGH (ref 70–99)
Glucose, Bld: 100 mg/dL — ABNORMAL HIGH (ref 70–99)
Glucose, Bld: 85 mg/dL (ref 70–99)
Glucose, Bld: 73 mg/dL (ref 70–99)
Potassium: 4.5 mEq/L (ref 3.5–5.1)
Potassium: 3.6 mEq/L (ref 3.5–5.1)
Potassium: 4.3 mEq/L (ref 3.5–5.1)
Potassium: 4.2 mEq/L (ref 3.5–5.1)
Sodium: 138 mEq/L (ref 135–145)
Sodium: 139 mEq/L (ref 135–145)
Sodium: 143 mEq/L (ref 135–145)
Sodium: 140 mEq/L (ref 135–145)

## 2010-10-12 LAB — CBC
HCT: 28.6 % — ABNORMAL LOW (ref 39.0–52.0)
HCT: 26.9 % — ABNORMAL LOW (ref 39.0–52.0)
HCT: 29.4 % — ABNORMAL LOW (ref 39.0–52.0)
HCT: 32 % — ABNORMAL LOW (ref 39.0–52.0)
HCT: 30.6 % — ABNORMAL LOW (ref 39.0–52.0)
Hemoglobin: 9.5 g/dL — ABNORMAL LOW (ref 13.0–17.0)
Hemoglobin: 8.8 g/dL — ABNORMAL LOW (ref 13.0–17.0)
Hemoglobin: 9.7 g/dL — ABNORMAL LOW (ref 13.0–17.0)
Hemoglobin: 10.4 g/dL — ABNORMAL LOW (ref 13.0–17.0)
Hemoglobin: 9.9 g/dL — ABNORMAL LOW (ref 13.0–17.0)
MCH: 28.8 pg (ref 26.0–34.0)
MCH: 28.7 pg (ref 26.0–34.0)
MCH: 28.4 pg (ref 26.0–34.0)
MCH: 28.4 pg (ref 26.0–34.0)
MCH: 28.1 pg (ref 26.0–34.0)
MCHC: 33.2 g/dL (ref 30.0–36.0)
MCHC: 32.7 g/dL (ref 30.0–36.0)
MCHC: 33 g/dL (ref 30.0–36.0)
MCHC: 32.5 g/dL (ref 30.0–36.0)
MCHC: 32.4 g/dL (ref 30.0–36.0)
MCV: 86.7 fL (ref 78.0–100.0)
MCV: 87.6 fL (ref 78.0–100.0)
MCV: 86 fL (ref 78.0–100.0)
MCV: 87.4 fL (ref 78.0–100.0)
MCV: 86.9 fL (ref 78.0–100.0)
Platelets: 284 10*3/uL (ref 150–400)
Platelets: 266 10*3/uL (ref 150–400)
Platelets: 265 10*3/uL (ref 150–400)
Platelets: 304 10*3/uL (ref 150–400)
Platelets: 262 10*3/uL (ref 150–400)
RBC: 3.3 MIL/uL — ABNORMAL LOW (ref 4.22–5.81)
RBC: 3.07 MIL/uL — ABNORMAL LOW (ref 4.22–5.81)
RBC: 3.42 MIL/uL — ABNORMAL LOW (ref 4.22–5.81)
RBC: 3.66 MIL/uL — ABNORMAL LOW (ref 4.22–5.81)
RBC: 3.52 MIL/uL — ABNORMAL LOW (ref 4.22–5.81)
RDW: 13.9 % (ref 11.5–15.5)
RDW: 14.1 % (ref 11.5–15.5)
RDW: 14.1 % (ref 11.5–15.5)
RDW: 14.3 % (ref 11.5–15.5)
RDW: 14 % (ref 11.5–15.5)
WBC: 9.5 10*3/uL (ref 4.0–10.5)
WBC: 8.8 10*3/uL (ref 4.0–10.5)
WBC: 8.5 10*3/uL (ref 4.0–10.5)
WBC: 9.2 10*3/uL (ref 4.0–10.5)
WBC: 9.4 10*3/uL (ref 4.0–10.5)

## 2010-10-12 LAB — IRON AND TIBC
Iron: 37 ug/dL — ABNORMAL LOW (ref 42–135)
Saturation Ratios: 14 % — ABNORMAL LOW (ref 20–55)
TIBC: 257 ug/dL (ref 215–435)
UIBC: 220 ug/dL

## 2010-10-12 LAB — URINE MICROSCOPIC-ADD ON

## 2010-10-12 LAB — URINALYSIS, ROUTINE W REFLEX MICROSCOPIC
Bilirubin Urine: NEGATIVE
Ketones, ur: NEGATIVE mg/dL
Leukocytes, UA: NEGATIVE
Nitrite: NEGATIVE
Protein, ur: 30 mg/dL — AB
Specific Gravity, Urine: 1.013 (ref 1.005–1.030)
Urine Glucose, Fasting: NEGATIVE mg/dL
Urobilinogen, UA: 0.2 mg/dL (ref 0.0–1.0)
pH: 5 (ref 5.0–8.0)

## 2010-10-12 LAB — RETICULOCYTES
RBC.: 3.07 MIL/uL — ABNORMAL LOW (ref 4.22–5.81)
Retic Count, Absolute: 92.1 10*3/uL (ref 19.0–186.0)
Retic Ct Pct: 3 % (ref 0.4–3.1)

## 2010-10-12 LAB — TYPE AND SCREEN
ABO/RH(D): A POS
Antibody Screen: NEGATIVE
Unit division: 0

## 2010-10-12 LAB — HEMOCCULT GUIAC POC 1CARD (OFFICE)
Fecal Occult Bld: NEGATIVE
Fecal Occult Bld: NEGATIVE

## 2010-10-12 LAB — PREPARE RBC (CROSSMATCH)

## 2010-10-12 LAB — VITAMIN B12: Vitamin B-12: 190 pg/mL — ABNORMAL LOW (ref 211–911)

## 2010-10-12 LAB — FERRITIN: Ferritin: 242 ng/mL (ref 22–322)

## 2010-10-12 LAB — BRAIN NATRIURETIC PEPTIDE: Pro B Natriuretic peptide (BNP): 1066 pg/mL — ABNORMAL HIGH (ref 0.0–100.0)

## 2010-10-12 LAB — ABO/RH: ABO/RH(D): A POS

## 2010-10-12 LAB — DIGOXIN LEVEL: Digoxin Level: 0.6 ng/mL — ABNORMAL LOW (ref 0.8–2.0)

## 2010-10-14 ENCOUNTER — Encounter: Payer: Self-pay | Admitting: Family Medicine

## 2010-10-15 ENCOUNTER — Telehealth: Payer: Self-pay | Admitting: Cardiology

## 2010-10-16 NOTE — Discharge Summary (Signed)
[name], [name]                 ACCOUNT NO.:  [id number]  MEDICAL RECORD NO.:  [id number]          PATIENT TYPE:  INP  LOCATION:  3740                         FACILITY:  [facility]  PHYSICIAN:  Pearlean Brownie, M.D.DATE OF BIRTH:  01/03/1951  DATE OF ADMISSION:  10/07/2010 DATE OF DISCHARGE:  10/10/2010                              DISCHARGE SUMMARY   REASON FOR HOSPITALIZATION: 1. Dyspnea. 2. Bilateral lower extremity edema. 3. Recent 10 pound weight gain.  DISCHARGE DIAGNOSES: 1. Congestive heart failure exacerbation. 2. Anemia.  CONSULTS:  [facility], [facility] .  PROCEDURES:  Esophagogastroduodenoscopy, colonoscopy.  IMAGES:  Chest x-ray with increased cardiomegaly with new pulmonary vascular congestion as well as bilateral pleural effusion with slight haziness in both lungs consistent with mild edema.  Consistent with congestive heart failure with small effusions and mild pulmonary edema.  PERTINENT LABS:  Creatinine on admission 0.95, creatinine on the day of discharge 0.99.  BNP 1066.  Cardiac enzymes 0.63, 0.47, 0.40.  INR on admission 2.3.  Digoxin on admission 0.6.  Urinalysis positive for moderate blood, 30 protein, hyaline casts.  Anemia panel significant for low iron 37.  Low percent saturation 14, vitamin B12 of 190.  CBC on admission 9.5, then 8.8, then 9.7 after transfusion, then 10.4 the next day, then 9.9 at the time of discharge.  BRIEF HOSPITAL COURSE:  This is a 60 year old male with multiple medical conditions including history of systolic congestive heart failure with an ejection fraction of 20-25%, coronary artery disease, hypertension, and diabetes mellitus who is admitted for CHF exacerbation. 1. CHF exacerbation.  The patient's estimated dry weight is 102 kg.     His weight on admission was 116 kg.  The patient was given     torsemide 40 mg IV b.i.d.  His BMP on admission was 1066.  On     hospital day #2, the patient diuresed nicely  with a negative of 5.5     L.  His creatinine did well despite the heavy diuresis and was 1.00     on hospital day #2.  The patient was kept on torsemide, but at a     lower dose of 40 mg p.o. daily which is twice his home dose.  His     weight on hospital day #2 was 111 kg.  On the hospital day #3, the     patient continued to diurese well putting out a negative of 2.5 L.     At this time, the patient was satting in the mid 90s on 2 L which     is his home oxygen requirement. 2. Anemia.  The patient's hemoglobin on admission was found to be 9.5.     His hemoglobin a couple of months previously was 12.  On hospital     day #2, his hemoglobin had dropped to 8.8.  Due to concern for     active bleeding and due to the recent significant decline in     hemoglobin, the patient was transfused with 1 unit of blood     especially because the patient was found to be pale and  in light of     his cardiac disease and other comorbidities.  The patient's     hemoglobin went up appropriately to 9.7 after the transfusion.  The     patient's hemoglobin at the time of discharge was 9.9.  The     patient's fecal Hemoccult was negative x2; however, due to the high     likelihood of a GI bleed, [facility] was asked to consult on the     patient.  They performed an esophagogastroduodenoscopy and     colonoscopy on hospital day #4.  The patient will follow up with     them as an outpatient. 3. 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  The patient's INR was found to be     therapeutic at 2.3 on admission.  The patient was kept on warfarin     per pharmacy during his hospitalization.  DISCHARGE MEDICATIONS: 1. Lantus 35 units subcutaneously nightly. 2. Lipitor 80 mg p.o. nightly. 3. Coreg 12.5 mg p.o. b.i.d. 4. Synthroid 100 mcg p.o. daily. 5. Digoxin 0.25 mg p.o. daily. 6. Imdur 60 mg XR p.o. daily. 7. Aspirin 81 mg p.o. daily. 8. Diovan 160 mg p.o. daily. 9. Coumadin 5 mg to take as directed. 10.Albuterol inhaler  1-2 puffs q.4 h. p.r.n. shortness of breath or     wheezing. 11.Nitrostat 0.4 mg p.r.n. chest pain. 12.Torsemide 40 mg p.o. daily. 13.Acetaminophen 325 mg p.o. p.r.n. 14.Colace 100 mg p.o. daily. 15.Cardura 0.5 mg p.o. daily. 16.MiraLax p.r.n. constipation. 17.Flomax 0.4 mg p.o. daily. 18.Oxygen 2 liters daily.  FOLLOWUP APPOINTMENTS:  The patient was asked to follow up with his primary care provider, Dr. [name] in [facility] in 1-2 weeks.  The patient was asked to follow up with the [facility] after discharge.  The patient was asked to keep his followup appointment with his urologist, Dr. Sherene Sires, on October 14, 2010.  FOLLOWUP ISSUES: 1. Discuss results of EGD and colonoscopy with the patient. 2. Status of removal of Foley catheter via Dr. Sherene Sires.    ______________________________ Priscella Mann, MD   ______________________________ Pearlean Brownie, M.D.    AO/MEDQ  D:  10/11/2010  T:  10/12/2010  Job:  [id number]  cc:   Cat Ta Dr. Sherene Sires Dr. __________  Electronically Signed by Priscella Mann MD on 10/13/2010 01:48:26 PM Electronically Signed by Pearlean Brownie M.D. on 10/16/2010 32:44:01 PM

## 2010-10-17 ENCOUNTER — Encounter: Payer: Self-pay | Admitting: Obstetrics and Gynecology

## 2010-10-19 NOTE — Consult Note (Addendum)
[name] NO.:  [id number]  MEDICAL RECORD NO.:  [id number]          PATIENT TYPE:  INP  LOCATION:  3740                         FACILITY:  [facility]  PHYSICIAN:  Vesta Mixer, M.D. DATE OF BIRTH:  05-02-1951  DATE OF CONSULTATION:  10/07/2010 DATE OF DISCHARGE:                                CONSULTATION   PRIMARY CARDIOLOGIST:  [name], MD, [facility]  PRIMARY MEDICAL DOCTOR:  Cat Ta, MD  CHIEF COMPLAINT:  Shortness of breath and lower extremity edema.  HISTORY OF PRESENT ILLNESS:  [name] is a 60 year old gentleman with a complicated medical history including coronary artery disease, chronic systolic/diastolic heart failure, COPD on home O2, diabetes, and history of DVT on Coumadin.  He was recently discharged from hospital September 15, 2010 for acute renal failure and UTI felt secondary to over diuresis and BPH with urinary retention.  At that time, he was discharged, his weight was 100.6 kg.  Over the last few weeks, he has felt progressively short of breath with increasing lower extremity edema.  He endorses decreased energy and dizziness when doing positional changes.  He is short of breath with any sort of activity.  His abdomen feels more swollen and tight.  He had an episode of chest pain over the weekend on Sunday at rest, at which time he took a sublingual nitro with no relief. Second sublingual nitro eased the pain and it slowly went away after about 45 minutes.  He tried sitting up and walking around and that made no difference.  He had significant orthopnea on that day as well.  On admission to the hospital, his stated weight is about 250 which equals 113.8 kg which is significantly increased from his prior discharge weight.  His BMP is elevated at 1066 and cardiac enzymes showed a negative CK with an MB of 7.5 and troponin of 0.63.  He is currently chest pain free but still has difficulty feeling comfortable with  his breathing and significant lower extremity edema.  He is on Demadex 20 mg p.o. at home and his primary service has initiated Demadex 40 mg IV b.i.d. as well as Zaroxolyn.  Of note he is also newly anemic with a hemoglobin of 9.5 and hematocrit of 28.6.  At discharge, September 15, 2010, it was 12.7 and 37.8.  His INR is subtherapeutic at 1.95.  He denies any obvious sources of bleeding.  PAST MEDICAL HISTORY: 1. Coronary artery disease.     a.     History of PCI to the RCA in 2007.     b.     NSTEMI status post PCI to the LAD and diagonal in 2002.     c.     Severe distal coronary artery disease with subtotally      occluded distal LAD and AV groove circumflex without change from      prior.  Moderate RCA disease and a tight OM, with a tight OM      possibly being amendable to PCI.  Please note in the EMR, this      catheterization does not show up  in the "Transcriptions" section, but instead      you must click Results / Cardiac Cath. 2. Chronic systolic heart failure with EF of 20-25% by echo August 27, 2010 with grade 1 diastolic dysfunction.  He had a MUGA showing     an EF prior of 42% December 2012. 3. Chronic obstructive pulmonary disease on home O2. 4. Hypertension. 5. Hyperlipidemia. 6. Hypothyroidism. 7. Obstructive sleep apnea. 8. DVT on Coumadin. 9. Obesity. 10.Anxiety. 11.Diabetes mellitus with history of retinopathy. 12.Fournier gangrene. 13.Osteoarthritis. 14.Intermittent left bundle-branch block. 15.Urinary retention and acute renal failure on last admission. 16.Chronic respiratory failure.  PAST SURGICAL HISTORY:  Spinal fusion in 1987 and tonsillectomy.  INPATIENT MEDICATIONS:  Aspirin 81 mg daily, Coreg 3.125 mg daily, digoxin 0.125 mg daily, Colace 100 mg daily, doxazosin 0.5 mg daily, Lantus 75 units at bedtime, Imdur 60 mg daily, levothyroxine 100 mcg daily, Zaroxolyn 5 mg daily, Demadex 40 mg IV b.i.d., Coumadin, Benicar 20 mg daily, Crestor 40  mg daily, and Flomax 0.4 mg daily.  Please note his home medication diuretic regimen is notable for Demadex 20 mg daily.  ALLERGIES:  ACTOS and ACE INHIBITORS.  SOCIAL HISTORY:  [name] is divorced and lives alone.  He lives in a 12 x 60 trailer and states that since he has gotten more short of breath, he has trouble even ambulating around this space.  He is a disabled [employer].  He is in the process of quitting cigarettes he states and has not smoked for a few weeks.  He drinks 1 beer a week to help him sleep.  He denies any drug use.  FAMILY HISTORY:  Negative for premature coronary artery disease.  His sister had breast cancer.  REVIEW OF SYSTEMS:  No fevers, chills.  He endorses chest pain, shortness of breath, dyspnea on exertion, orthopnea, edema, and presyncope/dizziness as well as decreased energy and decreased appetite. See HPI for other information.  All of his systems are reviewed and otherwise negative.  LABORATORY DATA:  WBC 9.5, hemoglobin 9.5 which is down from 12.7 on September 15, 2010, hematocrit 28.6, platelelet count 284,000.  Sodium 138, potassium 4.5, chloride 104, CO2 24, glucose 159, BUN 19, creatinine 0.95.  BNP 1066.  CK 147, MB 7.5, troponin 0.63.  PT is 22.4 and INR is 1.95.  CHEST X-RAY: 1. October 07, 2010 showed CHF with small effusions and mild pulmonary     edema.  PHYSICAL EXAMINATION:  VITAL SIGNS:  Temperature 98.7, pulse 76, respirations 18, blood pressure 132/77, pulse ox 94% on 3 liters. GENERAL:  This is a pleasant, pale white male in no acute distress  DICTATION ENDS AT THIS POINT     Dayna Dunn, P.A.C.   ______________________________ Vesta Mixer, M.D.    DD/MEDQ  D:  10/07/2010  T:  10/08/2010  Job:  [id number]  cc:   [name], MD, [facility], MD  Electronically Signed by Ronie Spies  on 10/19/2010 10:34:32 AM Electronically Signed by Kristeen Miss M.D. on 10/27/2010 12:14:23 PM

## 2010-10-19 NOTE — Consult Note (Addendum)
[name], [name]                 ACCOUNT NO.:  [id number]  MEDICAL RECORD NO.:  [id number]          PATIENT TYPE:  INP  LOCATION:  3740                         FACILITY:  [facility]  PHYSICIAN:  Vesta Mixer, M.D. DATE OF BIRTH:  1951-03-04  DATE OF CONSULTATION:  10/07/2010 DATE OF DISCHARGE:                                CONSULTATION   ADDENDUM  PHYSICAL EXAMINATION:  VITAL SIGNS:  Temperature 98.7, pulse 76, respirations 18, blood pressure 132/77, discharge weight on December 18, was 100.6 kg, stated weight is 251 pounds and translate to 113.8 kg, pulse ox 94% on 3 L. GENERAL:  This is a pleasant white male who appears older than stated age, in no acute distress.  He was quite pale. HEENT:  Normocephalic, atraumatic.  Extraocular movements intact and clear sclerae.  Nares are without discharge. NECK:  Supple without carotid bruits.  He has very mild JVD. LUNGS:  Auscultation of the heart reveals regular rate and rhythm with S1 and S2 without murmurs, rubs, or gallops.  Lungs have only faint bibasilar rales bilaterally. ABDOMEN:  Soft, nontender, nondistended with positive bowel sounds.  It is very round. EXTREMITIES:  Warm, dry with 2+ pale lower extremity edema up to his thighs.  It is not pitting up to about the level of his knees. NEUROLOGIC:  He is alert and oriented x3, responds to questions appropriately with a normal affect.  ASSESSMENT/PLAN:  The patient was seen and examined by Dr. Elease Hashimoto and myself.  This is a 60 year old gentleman with the prior history of known coronary artery disease, chronic systolic/diastolic heart failure, chronic obstructive pulmonary disease, prior deep venous thrombosis, and diabetes who presents with volume overload in the setting of new onset of anemia.  He complains of recent development of acute on chronic dyspnea with fatigue and leg heaviness.  He has had 1 episode of angina several days ago.  Initial set of cardiac markers did  show an elevation in troponins to 0.63.  At this point in time, the patient's primary compliant is fatigue.  We wonder that his acute on chronic CHF is secondary to his new onset anemia.  His cardiac markers/angina may be secondary to CHF and anemia is exacerbating factor.  He may need a cardiac catheterization once he is more stable.  However, first we will need to divert our attention to the anemia workup.  Dr. Elease Hashimoto has seen colon cancer with metastasis present in patients who have new-onset anemia and leg edema secondary to decreased venous return secondary to vascular obstruction.  He may benefit from considering a CT scan of the abdomen/colonoscopy for workup.  We would like to see how he does on Demadex by itself first and therefore discontinue his Zaroxolyn as well as discontinue his nitroglycerin drip.  He is not actively having chest pain.  We also check UA given his complaint of rust-colored urine as well as Hemoccult stool, and check an amemia panel.  We will also ask nursing to help elevate his legs while in bed.  We will follow Is and Os and daily weights and further management will depend on how  he does over the next couple of days.  We will continue to follow with you.  Thank you for the opportunity to participate in the care of this patient.     Ronie Spies, P.A.C.   ______________________________ Vesta Mixer, M.D.    DD/MEDQ  D:  10/07/2010  T:  10/08/2010  Job:  [id number]  cc:   [name], MD, [facility], MD  Electronically Signed by Ronie Spies  on 10/19/2010 10:35:01 AM Electronically Signed by Kristeen Miss M.D. on 10/27/2010 12:14:27 PM

## 2010-10-20 ENCOUNTER — Telehealth: Payer: Self-pay | Admitting: Family Medicine

## 2010-10-21 ENCOUNTER — Encounter: Payer: Self-pay | Admitting: Family Medicine

## 2010-10-21 ENCOUNTER — Ambulatory Visit: Admission: RE | Admit: 2010-10-21 | Discharge: 2010-10-21 | Payer: Self-pay | Source: Home / Self Care

## 2010-10-21 LAB — CONVERTED CEMR LAB
BUN: 20 mg/dL (ref 6–23)
CO2: 26 meq/L (ref 19–32)
Chloride: 104 meq/L (ref 96–112)
Creatinine, Ser: 1.03 mg/dL (ref 0.40–1.50)
Hgb A1c MFr Bld: 10.1 %

## 2010-10-22 ENCOUNTER — Telehealth: Payer: Self-pay | Admitting: Family Medicine

## 2010-10-22 ENCOUNTER — Encounter: Payer: Self-pay | Admitting: Family Medicine

## 2010-10-27 NOTE — Assessment & Plan Note (Signed)
Summary: NP follow up - post hosp   Primary Provider/Referring Provider:  Cat Ta MD  CC:  post hosp follow up - states breathing has improved.  History of Present Illness: 60 yo hx of CAD, Ischemic cardiomyopathy, smoker, COPD, HTN, OSA, DVT on chronic coumadin.    May 28, 2010--Presents for post hospitalization. Admitted w/  progressive dyspnea. 05/20/2010-- 05/25/2010 for an acute exacerbation of O2-dependent chronic obstructive pulmonary disease. 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  Found to have bialteral  pleural effusions and volume overload.  He had some mild elevation in his cardiac enzymes, but this is felt secondary to CHF.  Pt had agressive diuresis.  Has a hx of DVT w/ INR 1.1 , CT chest was done which was neg for PE. CT showed  bilateral pleural effusions and mild edema as well as some pleural nodules and lymphadenopathy that were essentially unchanged from a CT of 2006. Pulmoanry consulted for COPD flare and effusion.    He was started on azithromycin and while in the hospital, placed on Auto CPAP.  Will need outpatinet sleep study. Since discharge he is feeling better. He has few days left of zithromax. Denies chest pain,  orthopnea, hemoptysis, fever, n/v/d, edema, headache    Medications Prior to Update: 1)  Lantus 100 Unit/ml Soln (Insulin Glargine) .... Inject 30 Units Once Daily Disp: Qs 1 Month 2)  Lipitor 80 Mg Tabs (Atorvastatin Calcium) .Marland Kitchen.. 1 Tab By Mouth Daily 3)  Carvedilol 12.5 Mg Tabs (Carvedilol) .... Take One Tablet Twice Daily 4)  Furosemide 40 Mg Tabs (Furosemide) .... Take 3 Tabs in Am, 2 Tabs in Pm 5)  Synthroid 100 Mcg Tabs (Levothyroxine Sodium) .Marland Kitchen.. 1 Tablet By Mouth Daily 6)  Norvasc 5 Mg Tabs (Amlodipine Besylate) .Marland Kitchen.. 1 Tab By Mouth Daily 7)  Digoxin 0.25 Mg Tabs (Digoxin) .Marland Kitchen.. 1 Tab By Mouth Daily 8)  Imdur 60 Mg Xr24h-Tab (Isosorbide Mononitrate) .Marland Kitchen.. 1 Tablet By Mouth Daily 9)  Bayer Aspirin Ec Low  Dose 81 Mg Tbec (Aspirin) .Marland Kitchen.. 1 Tab By Mouth Daily 10)  Diovan 160 Mg Tabs (Valsartan) .... Take One Daily 11)  Warfarin Sodium 5 Mg Tabs (Warfarin Sodium) .... Take As Directed 12)  Potassium Chloride Crys Cr 20 Meq Cr-Tabs (Potassium Chloride Crys Cr) .... Take One Tablet By Mouth Daily As Directed 13)  Ventolin Hfa 108 (90 Base) Mcg/act Aers (Albuterol Sulfate) .Marland Kitchen.. 1-2 Puffs Inhaled Every 4 Hours As Needed For Shortness of Breath 14)  Nitrostat 0.4 Mg Subl (Nitroglycerin) .Marland Kitchen.. 1 Tab Under Tongue As Needed For Chest Pain  Current Medications (verified): 1)  Lantus 100 Unit/ml Soln (Insulin Glargine) .... Inject 30 Units Once Daily Disp: Qs 1 Month 2)  Ventolin Hfa 108 (90 Base) Mcg/act Aers (Albuterol Sulfate) .Marland Kitchen.. 1-2 Puffs Inhaled Every 4 Hours As Needed For Shortness of Breath 3)  Lipitor 80 Mg Tabs (Atorvastatin Calcium) .Marland Kitchen.. 1 Tab By Mouth Daily 4)  Carvedilol 12.5 Mg Tabs (Carvedilol) .... Take One Tablet Twice Daily 5)  Furosemide 40 Mg Tabs (Furosemide) .... Take 3 Tabs in Am, 2 Tabs in Pm 6)  Synthroid 100 Mcg Tabs (Levothyroxine Sodium) .Marland Kitchen.. 1 Tablet By Mouth Daily 7)  Nitrostat 0.4 Mg Subl (Nitroglycerin) .Marland Kitchen.. 1 Tab Under Tongue As Needed For Chest Pain 8)  Norvasc 5 Mg Tabs (Amlodipine Besylate) .Marland Kitchen.. 1 Tab By Mouth Daily 9)  Digoxin 0.25 Mg Tabs (Digoxin) .Marland Kitchen.. 1 Tab By Mouth Daily 10)  Imdur 60 Mg  Xr24h-Tab (Isosorbide Mononitrate) .Marland Kitchen.. 1 Tablet By Mouth Daily 11)  Bayer Aspirin Ec Low Dose 81 Mg Tbec (Aspirin) .Marland Kitchen.. 1 Tab By Mouth Daily 12)  Diovan 160 Mg Tabs (Valsartan) .... Take One Daily 13)  Warfarin Sodium 5 Mg Tabs (Warfarin Sodium) .... Take As Directed 14)  Potassium Chloride Crys Cr 20 Meq Cr-Tabs (Potassium Chloride Crys Cr) .... Take One Tablet By Mouth Daily As Directed 15)  Benzonatate 100 Mg Caps (Benzonatate) .... Take 1 Capsule By Mouth Three Times A Day As Needed 16)  Oxygen .... Wear 2l/min Continuously  Allergies (verified): 1)  Ace Inhibitors 2)  *  Actos  Past History:  Past Surgical History: Last updated: 03/25/2009 Lumbar spinal fusion - about 1987 - 09/27/1985,  Tonsillectomy - as a child - 09/27/1954  Family History: Last updated: 01/21/2009  Mother; he does not know her health problems.  Father   is unknown to the patient.  A sister had breast cancer.      Social History: Last updated: 05/28/2010 The patient is divorced.   He lives alone. 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   .  No significant alcohol.  current smoker, began age 55, 2ppd  Risk Factors: Smoking Status: current (04/24/2010) Packs/Day: 0.25 (04/07/2010)  Past Medical History:  CARDIOMYOPATHY, ISCHEMIC (ICD-414.8) (EF 30%) CAD (ICD-414.00). Stent placement Right coronary artery 2007. Stenting LAD and cutting balloon of the diagonal (history of an inferior myocardial infarction.  Stent placement to the right coronary artery in May 2007.  He also has stenting to his LAD and a cutting balloon of the diagonal.  The most recent catheterization in June 2008 demonstrated distal obstructive LAD disease, but otherwise no acute disease.)  1. History of coronary artery disease with last catheterization in     January 2008 showing an ejection fraction of 35%, diagonal 40%,     distal left anterior descending artery 95% (2-mm vessel),     circumflex 70% and no in-stent restenosis in the right coronary     artery, medical therapy was recommended. 2. Ischemic cardiomyopathy. 3. Ongoing tobacco abuse. 4. Hypertension. 5. Hyperlipidemia. 6. Hypothyroidism. 7. Obstructive sleep apnea. 8. History of deep venous thrombosis, on chronic Coumadin. 9. Allergy or intolerance to ACE INHIBITORS with cough and     PIOGLITAZONE. 10.History of stent to the right coronary artery and staged stent to     the left anterior descending artery with percutaneous coronary     intervention of the first diagonal branch in May 2002 secondary to     non-ST segment elevation myocardial  infarction. 11.Obesity. 12.History of spinal fusion. 13.Anxiety. 14.History of Fournier gangrene, status post surgical debridement x2     in 2006. 15.History of diabetic nephropathy. 16.Osteoarthritis.  Social History: The patient is divorced.   He lives alone. 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   .  No significant alcohol.  current smoker, began age 25, 2ppd  Review of Systems      See HPI  Vital Signs:  Patient profile:   60 year old male Height:      69 inches Weight:      237.50 pounds BMI:     35.20 O2 Sat:      92 % on Room air Temp:     97.0 degrees F oral Pulse rate:   82 / minute BP sitting:   126 / 78  (left arm) Cuff size:   large  Vitals Entered By: Boone Master CNA/MA (May 28, 2010 11:54 AM)  O2 Flow:  Room air CC: post hosp follow up - states breathing has improved Is Patient Diabetic? Yes Comments Medications reviewed with patient Daytime contact number verified with patient. Boone Master CNA/MA  May 28, 2010 11:55 AM    Physical Exam  Additional Exam:  GEN: A/Ox3; obese male walks w/ cane   wt 237 May 28, 2010>> HEENT:  [place]/AT, , EACs-clear, TMs-wnl, NOSE-clear, THROAT-clear NECK:  Supple w/ fair ROM; no JVD; normal carotid impulses w/o bruits; no thyromegaly or nodules palpated; no lymphadenopathy. RESP  Clear to P & A; w/o, wheezes/ rales/ or rhonchi. CARD:  RRR, no m/r/g   GI:   Soft & nt; nml bowel sounds; no organomegaly or masses detected. [place]co: Warm bil,  no calf tenderness  clubbing, pulses intact, tr edema  Neuro:  intact w/ no focal deficits noted.    Impression & Recommendations:  Problem # 1:  COPD (ICD-496) Recent flare w/ associated volume overload. and suspected underlying OSA>  Plan to return for PFT to establish baseline FEV1 finish abx.  flu shot today  advised on smoking cesstation.   Problem # 2:  APNEA, SLEEP (ICD-780.57) suspected OSA w/ morbid obesity.  nocturnal desats, fatigue.   set up for  split night sleep test.   Orders: Sleep Disorder Referral (Sleep Disorder) Est. Patient Level IV (82956)  Medications Added to Medication List This Visit: 1)  Potassium Chloride Crys Cr 20 Meq Cr-tabs (Potassium chloride crys cr) .... Take one tablet by mouth daily 2)  Oxygen  .... Wear 2l/min continuously 3)  Benzonatate 100 Mg Caps (Benzonatate) .... Take 1 capsule by mouth three times a day as needed  Complete Medication List: 1)  Lantus 100 Unit/ml Soln (Insulin glargine) .... Inject 30 units once daily disp: qs 1 month 2)  Lipitor 80 Mg Tabs (Atorvastatin calcium) .Marland Kitchen.. 1 tab by mouth daily 3)  Carvedilol 12.5 Mg Tabs (Carvedilol) .... Take one tablet twice daily 4)  Furosemide 40 Mg Tabs (Furosemide) .... Take 3 tabs in am, 2 tabs in pm 5)  Synthroid 100 Mcg Tabs (Levothyroxine sodium) .Marland Kitchen.. 1 tablet by mouth daily 6)  Norvasc 5 Mg Tabs (Amlodipine besylate) .Marland Kitchen.. 1 tab by mouth daily 7)  Digoxin 0.25 Mg Tabs (Digoxin) .Marland Kitchen.. 1 tab by mouth daily 8)  Imdur 60 Mg Xr24h-tab (Isosorbide mononitrate) .Marland Kitchen.. 1 tablet by mouth daily 9)  Bayer Aspirin Ec Low Dose 81 Mg Tbec (Aspirin) .Marland Kitchen.. 1 tab by mouth daily 10)  Diovan 160 Mg Tabs (Valsartan) .... Take one daily 11)  Warfarin Sodium 5 Mg Tabs (Warfarin sodium) .... Take as directed 12)  Potassium Chloride Crys Cr 20 Meq Cr-tabs (Potassium chloride crys cr) .... Take one tablet by mouth daily 13)  Oxygen  .... Wear 2l/min continuously 14)  Ventolin Hfa 108 (90 Base) Mcg/act Aers (Albuterol sulfate) .Marland Kitchen.. 1-2 puffs inhaled every 4 hours as needed for shortness of breath 15)  Nitrostat 0.4 Mg Subl (Nitroglycerin) .Marland Kitchen.. 1 tab under tongue as needed for chest pain 16)  Benzonatate 100 Mg Caps (Benzonatate) .... Take 1 capsule by mouth three times a day as needed  Other Orders: Flu Vaccine 68yrs + (21308) Administration Flu vaccine - MCR ([id number])  Patient Instructions: 1)  follow up Dr. Sherene Sires with PFT in 3 weeks  2)  Set up for sleep study.  3)   Finish Zithromax.  4)  Please contact office for sooner follow up if symptoms do not improve or  worsen  5)              Flu Vaccine Consent Questions     Do you have a history of severe allergic reactions to this vaccine? no    Any prior history of allergic reactions to egg and/or gelatin? no    Do you have a sensitivity to the preservative Thimersol? no    Do you have a past history of Guillan-Barre Syndrome? no    Do you currently have an acute febrile illness? no    Have you ever had a severe reaction to latex? no    Vaccine information given and explained to patient? yes    Are you currently pregnant? no    Lot Number:[id number]   Exp Date:03/27/2011   Site Given  Left Deltoid IMflu   Mindy Silva  May 28, 2010 12:12 PM

## 2010-10-27 NOTE — Letter (Signed)
SummaryScience writer Pulmonary Care Appointment Letter  [facility]  [address]   [place], [place] [address]   Phone: [phone]  Fax: [phone]    07/06/2010 MRN: [id number]  [name] [address] [place], [place]  [address]  Dear [name],   Our office is attempting to contact you about an appointment.  Please call our office at [phone] to schedule this appointment with Dr.________________.  Our registration staff is prepared to assist you with any questions you may have.    Thank you,   Nature conservation officer Pulmonary Division

## 2010-10-27 NOTE — Assessment & Plan Note (Signed)
Summary: F/U PER Tyreon Frigon/KH   Vital Signs:  Patient profile:   60 year old male Height:      69 inches Weight:      240.5 pounds BMI:     35.64 Temp:     97.8 degrees F oral Pulse rate:   68 / minute BP sitting:   112 / 71  (left arm) Cuff size:   regular  Vitals Entered By: Garen Grams LPN (April 08, 2010 8:37 AM) CC: f/u infected finger Is Patient Diabetic? Yes Did you bring your meter with you today? No Pain Assessment Patient in pain? no        Primary Care Provider:  Kemi Gell MD  CC:  f/u infected finger.  History of Present Illness: 60 y/o M was seen yesterday for infected finger and is here today for f/u.  Pt denies any pain.  Swelling seems to have improved.  Redness better.  He unfortunately the pharmacy had a mixed-up with medications so he has not taken any Augmentin.  I called the pharmacy today and made sure that they had the order.  Pt has not any fevers, chills, nausea, vomiting.    Habits & Providers  Alcohol-Tobacco-Diet     Tobacco Status: current     Tobacco Counseling: to quit use of tobacco products  Current Medications (verified): 1)  Lantus 100 Unit/ml Soln (Insulin Glargine) .... Inject 30 Units Once Daily Disp: Qs 1 Month 2)  Ventolin Hfa 108 (90 Base) Mcg/act Aers (Albuterol Sulfate) .Marland Kitchen.. 1-2 Puffs Inhaled Every 4 Hours As Needed For Shortness of Breath 3)  Lipitor 80 Mg Tabs (Atorvastatin Calcium) .Marland Kitchen.. 1 Tab By Mouth Daily 4)  Carvedilol 12.5 Mg Tabs (Carvedilol) .... Take One Tablet Twice Daily 5)  Furosemide 40 Mg Tabs (Furosemide) .... Take 3 Tabs in Am, 2 Tabs in Pm 6)  Synthroid 100 Mcg Tabs (Levothyroxine Sodium) .Marland Kitchen.. 1 Tablet By Mouth Daily 7)  Nitrostat 0.4 Mg Subl (Nitroglycerin) .Marland Kitchen.. 1 Tab Under Tongue As Needed For Chest Pain 8)  Norvasc 5 Mg Tabs (Amlodipine Besylate) .Marland Kitchen.. 1 Tab By Mouth Daily 9)  Digoxin 0.25 Mg Tabs (Digoxin) .Marland Kitchen.. 1 Tab By Mouth Daily 10)  Imdur 60 Mg Xr24h-Tab (Isosorbide Mononitrate) .Marland Kitchen.. 1 Tablet By Mouth Daily 11)   Bayer Aspirin Ec Low Dose 81 Mg Tbec (Aspirin) .Marland Kitchen.. 1 Tab By Mouth Daily 12)  Diovan 160 Mg Tabs (Valsartan) .... Take One Daily 13)  Warfarin Sodium 5 Mg Tabs (Warfarin Sodium) .... Take As Directed 14)  Potassium Chloride Crys Cr 20 Meq Cr-Tabs (Potassium Chloride Crys Cr) .... Take One Tablet By Mouth Daily As Directed 15)  Augmentin 875-125 Mg Tabs (Amoxicillin-Pot Clavulanate) .Marland Kitchen.. 1 Tab By Mouth Two Times A Day X 10 Days  Allergies (verified): 1)  Ace Inhibitors 2)  * Actos  Past History:  Past Medical History: Last updated: 03/25/2009 ANXIETY (ICD-300.00) CARDIOMYOPATHY, ISCHEMIC (ICD-414.8) (EF 30%) CAD (ICD-414.00). Stent placement Right coronary artery 2007. Stenting LAD and cutting balloon of the diagonal (history of an inferior myocardial infarction.  Stent placement to the right coronary artery in May 2007.  He also has stenting to his LAD and a cutting balloon of the diagonal.  The most recent catheterization in June 2008 demonstrated distal obstructive LAD disease, but otherwise no acute disease.) HYPERLIPIDEMIA (ICD-272.4) HYPERTENSION, BENIGN SYSTEMIC (ICD-401.1) COPD (ICD-496) OBESITY, NOS (ICD-278.00) DIABETES MELLITUS II, UNCOMPLICATED (ICD-250.00) HYPOTHYROIDISM, UNSPECIFIED (ICD-244.9) SPECIAL SCREENING FOR MALIGNANT NEOPLASMS COLON (ICD-V76.51) SPECIAL SCREENING MALIGNANT NEOPLASM OF PROSTATE (ICD-V76.44) APNEA,  SLEEP (ICD-780.57) Superficial Vein Thromosis  Past Surgical History: Last updated: 03/25/2009 Lumbar spinal fusion - about 1987 - 09/27/1985,  Tonsillectomy - as a child - 09/27/1954  Family History: Last updated: 01/21/2009  Mother; he does not know her health problems.  Father   is unknown to the patient.  A sister had breast cancer.      Social History: Last updated: 01/21/2009 The patient is divorced.  He lives alone.  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  He has a history of tobacco use, but quit over   10 years ago.  No significant alcohol.    Risk Factors: Smoking Status: current (04/08/2010) Packs/Day: 0.25 (04/07/2010)  Review of Systems       per hpi  Physical Exam  General:  Well-developed,well-nourished,in no acute distress; alert,appropriate and cooperative throughout examination. vitals reviewed.   Extremities:  Left 3rd finger: distal phal;  redness dec, +swelling but not more than yesterday, no streaking, no seepage, flexion of finger as well as yesterday, still 0.3cm area of open skin that red   Impression & Recommendations:  Problem # 1:  FINGER, BLISTER, INFECTED (ICD-915.3) Assessment Unchanged Finger does not show worse than yesterday.  No streaks, swelling a [place] better, flexion ok.  Pt to start augmentin today x 10days.  F/u with me in next 2 wks.  Red flags given.  Orders: [facility]- Est Level  3 (72536)  Complete Medication List: 1)  Lantus 100 Unit/ml Soln (Insulin glargine) .... Inject 30 units once daily disp: qs 1 month 2)  Ventolin Hfa 108 (90 Base) Mcg/act Aers (Albuterol sulfate) .Marland Kitchen.. 1-2 puffs inhaled every 4 hours as needed for shortness of breath 3)  Lipitor 80 Mg Tabs (Atorvastatin calcium) .Marland Kitchen.. 1 tab by mouth daily 4)  Carvedilol 12.5 Mg Tabs (Carvedilol) .... Take one tablet twice daily 5)  Furosemide 40 Mg Tabs (Furosemide) .... Take 3 tabs in am, 2 tabs in pm 6)  Synthroid 100 Mcg Tabs (Levothyroxine sodium) .Marland Kitchen.. 1 tablet by mouth daily 7)  Nitrostat 0.4 Mg Subl (Nitroglycerin) .Marland Kitchen.. 1 tab under tongue as needed for chest pain 8)  Norvasc 5 Mg Tabs (Amlodipine besylate) .Marland Kitchen.. 1 tab by mouth daily 9)  Digoxin 0.25 Mg Tabs (Digoxin) .Marland Kitchen.. 1 tab by mouth daily 10)  Imdur 60 Mg Xr24h-tab (Isosorbide mononitrate) .Marland Kitchen.. 1 tablet by mouth daily 11)  Bayer Aspirin Ec Low Dose 81 Mg Tbec (Aspirin) .Marland Kitchen.. 1 tab by mouth daily 12)  Diovan 160 Mg Tabs (Valsartan) .... Take one daily 13)  Warfarin Sodium 5 Mg Tabs (Warfarin sodium) .... Take as directed 14)  Potassium Chloride Crys Cr 20 Meq Cr-tabs  (Potassium chloride crys cr) .... Take one tablet by mouth daily as directed 15)  Augmentin 875-125 Mg Tabs (Amoxicillin-pot clavulanate) .Marland Kitchen.. 1 tab by mouth two times a day x 10 days  Patient Instructions: 1)  Please cancel 7/27 appt with Dr Fara Boros and schedule one in the same week with Dr Janalyn Harder 2)  Take Augmentin two times a day for 10 days. 3)  If your finger gets worse: more painful, red streaks up the hand, more swelling, more pus, more ulcer call the [facility] right away.

## 2010-10-27 NOTE — Medication Information (Signed)
Summary: rov/ewj  Anticoagulant Therapy  Managed by: Reina Fuse, PharmD PCP: [name] MD Supervising MD: Jens Som MD, Arlys John Indication 1: Deep Vein Thrombosis - Leg (ICD-451.1) Lab Used: [facility] Site: [facility] INR POC 1.9 INR RANGE 2 - 3  Dietary changes: yes       Details: Has eaten less greens than usual. Not as hungry.   Health status changes: no    Bleeding/hemorrhagic complications: no    Recent/future hospitalizations: no    Any changes in medication regimen? no    Recent/future dental: no  Any missed doses?: no       Is patient compliant with meds? yes      Comments: Reports having some stomach upset this morning.  Pt reports he has been taking Coumadin 1.5 tabs all days except Coumadin 1 tab on Sundays.  Current Medications (verified): 1)  Lantus 100 Unit/ml Soln (Insulin Glargine) .... Inject 30 Units Once Daily Disp: Qs 1 Month 2)  Ventolin Hfa 108 (90 Base) Mcg/act Aers (Albuterol Sulfate) .Marland Kitchen.. 1-2 Puffs Inhaled Every 4 Hours As Needed For Shortness of Breath 3)  Lipitor 80 Mg Tabs (Atorvastatin Calcium) .Marland Kitchen.. 1 Tab By Mouth Daily 4)  Carvedilol 12.5 Mg Tabs (Carvedilol) .... Take One Tablet Twice Daily 5)  Furosemide 40 Mg Tabs (Furosemide) .... Take 3 Tabs in Am, 2 Tabs in Pm 6)  Synthroid 100 Mcg Tabs (Levothyroxine Sodium) .Marland Kitchen.. 1 Tablet By Mouth Daily 7)  Nitrostat 0.4 Mg Subl (Nitroglycerin) .Marland Kitchen.. 1 Tab Under Tongue As Needed For Chest Pain 8)  Norvasc 5 Mg Tabs (Amlodipine Besylate) .Marland Kitchen.. 1 Tab By Mouth Daily 9)  Digoxin 0.25 Mg Tabs (Digoxin) .Marland Kitchen.. 1 Tab By Mouth Daily 10)  Imdur 60 Mg Xr24h-Tab (Isosorbide Mononitrate) .Marland Kitchen.. 1 Tablet By Mouth Daily 11)  Bayer Aspirin Ec Low Dose 81 Mg Tbec (Aspirin) .Marland Kitchen.. 1 Tab By Mouth Daily 12)  Diovan 160 Mg Tabs (Valsartan) .... Take One Daily 13)  Warfarin Sodium 5 Mg Tabs (Warfarin Sodium) .... Take As Directed 14)  Potassium Chloride Crys Cr 20 Meq Cr-Tabs (Potassium Chloride Crys Cr) .... Take One Tablet By  Mouth Daily As Directed  Allergies (verified): 1)  Ace Inhibitors 2)  * Actos  Anticoagulation Management History:      The patient is taking warfarin and comes in today for a routine follow up visit.  Positive risk factors for bleeding include presence of serious comorbidities.  Negative risk factors for bleeding include an age less than 84 years old.  The bleeding index is 'intermediate risk'.  Positive CHADS2 values include History of CHF, History of HTN, and History of Diabetes.  Negative CHADS2 values include Age > 56 years old.  The start date was 12/25/2008.  His last INR was 1.99.  Anticoagulation responsible provider: Jens Som MD, Arlys John.  INR POC: 1.9.  Cuvette Lot#: [id number].  Exp: 06/2011.    Anticoagulation Management Assessment/Plan:      The patient's current anticoagulation dose is Warfarin sodium 5 mg tabs: take as directed.  The target INR is 2 - 3.  The next INR is due 05/22/2010.  Anticoagulation instructions were given to patient.  Results were reviewed/authorized by Reina Fuse, PharmD.  He was notified by Reina Fuse PharmD.         Prior Anticoagulation Instructions: INR 1.1  Take 2 tablets today and tomorrow, then start taking 1.5 tablets daily.  Recheck in 1-2 weeks.    Current Anticoagulation Instructions: INR 1.9  Take Coumadin 2 tabs (10  mg) tomorrow, Saturday, August 13th.  Take Coumadin 1.5 tabs (7.5 mg) every day.  Return in 2 weeks.

## 2010-10-27 NOTE — Letter (Signed)
Summary: Generic Letter  [name]  [address]   [place], [place] [address]   Phone: [phone]  Fax: [phone]    09/03/2010  [name] [address]  [address] [place], [place]  [address]  To whom it may concern:  This letter is regarding Mr. [name].  He was hospitalized from August 26, 2010 to the evening of September 01, 2010. Please take this into consideration regarding his late payment for his rent.      Sincerely,   Nataline Basara MD

## 2010-10-27 NOTE — Letter (Signed)
Summary: Generic Letter  [name]  [address]   [place], [place] [address]   Phone: [phone]  Fax: [phone]    07/19/2010  Lovel Reckart [address]  [address] [place], [place]  [address]  Dear Mr. Blossom,  We have been trying to reach you but have not been able to speak with you.  Please call our office to schedule an appointment.  We would like to discuss with you your Pulmonary Function Test, Colonoscopy, and follow-exam for your chronic medical conditions.    Sincerely,   Cat Ta MD

## 2010-10-27 NOTE — Miscellaneous (Signed)
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 MPI done 6/08 showed large scar with mild peri-infarct ischemia. His EF was 24%. 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 This study is to reassess LVEF and function. IV access by Milana Na, EMT-Paramedic in the (L) Wrist- 22G catheter.  Muga Information: The patient's red blood cells were labeled using the Ultra Tag method with 33 mci of Technitium-78m Pertechnetate.  The images were reconstructed in the Anterior, Lateral and Left Anterior Oblique Views.  Impression: The gated ejection fraction was 42%. There was akinesis of the inferior wall and apex.  (09/15/2009 9:33)      Nuclear Study  Procedure date:  09/15/2009  Findings:      Indication: The patient has a history of IWMI 5/07 treated with Stents- RCA and LAD. MPI done 6/08 showed large scar with mild peri-infarct ischemia. His EF was 24%. 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 This study is to reassess LVEF and function. IV access by Milana Na, EMT-Paramedic in the (L) Wrist- 22G catheter.  Muga Information: The patient's red blood cells were labeled using the Ultra Tag method with 33 mci of Technitium-70m Pertechnetate.  The images were reconstructed in the Anterior, Lateral and Left Anterior Oblique Views.  Impression: The gated ejection fraction was 42%. There was akinesis of the inferior wall and apex.

## 2010-10-27 NOTE — Progress Notes (Signed)
----   Converted from flag ---- ---- 07/17/2010 10:13 AM, Arlyss Repress CMA, wrote: called pt again several times. number is disconnected...thekla ------------------------------  Called again.  letter sent. Marland KitchenAngeline Slim MD

## 2010-10-27 NOTE — Assessment & Plan Note (Signed)
Summary: fu/kh   Vital Signs:  Patient profile:   60 year old male Height:      69 inches Weight:      228 pounds BMI:     33.79 Pulse rate:   66 / minute BP sitting:   123 / 70  (left arm) Cuff size:   regular  Vitals Entered By: Tessie Fass CMA (January 14, 2010 9:18 AM) CC: F/U diabetes Is Patient Diabetic? Yes Pain Assessment Patient in pain? yes     Location: head Intensity: 6   Primary Care Provider:  Ardeen Garland  MD  CC:  F/U diabetes.  History of Present Illness: [name] comes in today for follow-up of diabetes.  He returned to our practice last month after a long absence and his diabetes has been very poorly controlled.  At that visit he was asked to increase his lantus to 25 units for 3 days and then up to 30.  He went up to 27 units.  He couldn'nt remember what I said and he couldn't read the small print on the instructions.  He was able to purchase a new meter and strips earlier this month and has been checking his sugars AM and PM since 4/10 and he brings the log with him today.  His A1C last month was >14.  His first few sugars were in the 200s and 300s but have been trending down nicely.  AMs range 148-365 with most 148-179.  PMs range 153 - 373 with most 153-190.  No hypoglycemic episodes.  He has also been trying to decrease his sweets and carbs.   He also complains of dizziness.  Usually upon standing.  Feels unsteady.  Lasts about a minute and then goes away.  Can happen several times a day sometimes.  Follows with cardiology for CHF, HTN, and HLD.  States he told them about it but they said not to worry about it.  Also feels generally unsteady in his gait sometimes.  Uses a cane.  Went to PT years ago and they gave him exercises to do and they helped then but he hasn't done them in a long time and he's not sure where they are.   Habits & Providers  Alcohol-Tobacco-Diet     Tobacco Status: current     Tobacco Counseling: to quit use of tobacco products  Cigarette Packs/Day: 0.25  Allergies: 1)  Ace Inhibitors 2)  * Actos  Social History: Packs/Day:  0.25  Physical Exam  General:  Obese, NAD.  Alert, oritented, makes more sense today. vitals reviewed Neurologic:  strength normal in all extremities.  Gait unsteady with assistive device   Impression & Recommendations:  Problem # 1:  DIABETES MELLITUS, TYPE II, UNCONTROLLED, WITH COMPLICATIONS (ICD-250.92) Assessment Improved  Improving with increased Lantus and more dietary discretion.  Will further increase Lantus to 30 units and follow-up in 1 month.  If sugars continue to improve as they have with just an increase of 5 units, hopeful that he may not require mealtime insulin, especially if he continues to watch his diet.   His updated medication list for this problem includes:    Lantus 100 Unit/ml Soln (Insulin glargine) ..... Inject 30 units once daily disp: qs 1 month    Bayer Aspirin Ec Low Dose 81 Mg Tbec (Aspirin) .Marland Kitchen... 1 tab by mouth daily    Diovan 160 Mg Tabs (Valsartan) .Marland Kitchen... Take one daily  Orders: [facility]- Est  Level 4 (34742)  Problem # 2:  ABNORMALITY  OF GAIT (ICD-781.2) Assessment: New  Declines PT referral at this time as he has transportation issues.  Is going to try to find the exercises.  Discussed he may benefit from just one or two visits to get a new strengthening and balance routine.  He will "think about it".   Orders: [facility]- Est  Level 4 (13086)  Problem # 3:  ORTHOSTATIC DIZZINESS (ICD-780.4) Assessment: New  Does not want to decrease his meds at this time.  Feeels like he's been doing well on them.  BP at goal but honestly not a lot of room to decrease med without then being above goal.  However, may have to accept slightly higher BP if dizziness with standing continues despite increasing fluids and being careful to rise slowly.  Will discuss again at next visit.   Orders: [facility]- Est  Level 4 (99214)  Complete Medication List: 1)  Lantus 100 Unit/ml  Soln (Insulin glargine) .... Inject 30 units once daily disp: qs 1 month 2)  Ventolin Hfa 108 (90 Base) Mcg/act Aers (Albuterol sulfate) .Marland Kitchen.. 1-2 puffs inhaled every 4 hours as needed for shortness of breath 3)  Lipitor 80 Mg Tabs (Atorvastatin calcium) .Marland Kitchen.. 1 tab by mouth daily 4)  Carvedilol 12.5 Mg Tabs (Carvedilol) .... Take one tablet twice daily 5)  Furosemide 40 Mg Tabs (Furosemide) .... Take 3 tabs in am, 2 tabs in pm 6)  Synthroid 100 Mcg Tabs (Levothyroxine sodium) .Marland Kitchen.. 1 tablet by mouth daily 7)  Nitrostat 0.4 Mg Subl (Nitroglycerin) .Marland Kitchen.. 1 tab under tongue as needed for chest pain 8)  Norvasc 5 Mg Tabs (Amlodipine besylate) .Marland Kitchen.. 1 tab by mouth daily 9)  Digoxin 0.25 Mg Tabs (Digoxin) .Marland Kitchen.. 1 tab by mouth daily 10)  Imdur 60 Mg Xr24h-tab (Isosorbide mononitrate) .Marland Kitchen.. 1 tablet by mouth daily 11)  Bayer Aspirin Ec Low Dose 81 Mg Tbec (Aspirin) .Marland Kitchen.. 1 tab by mouth daily 12)  Diovan 160 Mg Tabs (Valsartan) .... Take one daily 13)  Warfarin Sodium 5 Mg Tabs (Warfarin sodium) .... Take as directed 14)  Potassium Chloride Crys Cr 20 Meq Cr-tabs (Potassium chloride crys cr) .... Take one tablet by mouth daily as directed

## 2010-10-27 NOTE — Progress Notes (Signed)
Summary: sleep results  Phone Note Call from Patient Call back at [phone]   Caller: Patient Call For: wert Reason for Call: Lab or Test Results Summary of Call: Wants his sleep results, doesn't want to make an appt. Initial call taken by: Darletta Moll,  July 22, 2010 10:13 AM  Follow-up for Phone Call        Pt was requesting sleep study results. I advised he would need an appt with sleep doc to review this because [facility] was not a sleep specialists. I did adivse an order had been placed for pt to have a cpap/bipap study at sleep center on 07-30-10 at pm. [facility] was unable to reach pt so they mailed a packet. Pt unsure f he has received the packet, but will check and call back if not. Pt aagian advised of study appt and also set to see [facility] on 08-13-10 at 11:45. Carron Curie CMA  July 22, 2010 12:10 PM

## 2010-10-27 NOTE — Assessment & Plan Note (Signed)
Summary: infected finger per [name]/[place]/[facility]   Vital Signs:  Patient profile:   60 year old male Height:      69 inches Weight:      240.3 pounds BMI:     35.61 Temp:     97.8 degrees F oral Pulse rate:   73 / minute BP sitting:   117 / 72  (left arm) Cuff size:   regular  Vitals Entered By: Garen Grams LPN (April 07, 2010 9:17 AM) CC: infected finger Is Patient Diabetic? Yes Did you bring your meter with you today? No Pain Assessment Patient in pain? yes     Location: finger   Primary Care Provider:  Kenneth Cuaresma MD  CC:  infected finger.  History of Present Illness: This is my first time meeting [name].    60 y/o M here for infected L 3rd finger.    Was visiting his neighbor's house 2 weekends ago when he was scratched by neighbor's new kitten.  It was fine at first.  This past weekend it started to swell.  He heated a needle and inserted it into abscess. He saw yellow discharge from this prior to seeing blood.  He states that the finger is not painful, but there is blood that continues to seep from it.    -fever, -chills, +nausea, -vomiting, -diaphoresis.  Has been "feeling bad" for a while.  He gets chest pain once in a while relieved by NTG.  No chest pain right now.    Chest pain: [name] with history of stable angina.  He has CP periodically that is usually relieved by NTG.  On Saturday (4 days ago) he experienced chest pain and clamminess.  He took the NTG x 2 but, which relieved the chest pain, but clamminess remained for 2 hrs.  He blamed some of this on the heat.  He currently denies any chest pain.  He states that he feels pretty good right now.    Habits & Providers  Alcohol-Tobacco-Diet     Tobacco Status: current     Tobacco Counseling: to quit use of tobacco products     Cigarette Packs/Day: 0.25  Current Medications (verified): 1)  Lantus 100 Unit/ml Soln (Insulin Glargine) .... Inject 30 Units Once Daily Disp: Qs 1 Month 2)  Ventolin Hfa 108 (90 Base) Mcg/act Aers  (Albuterol Sulfate) .Marland Kitchen.. 1-2 Puffs Inhaled Every 4 Hours As Needed For Shortness of Breath 3)  Lipitor 80 Mg Tabs (Atorvastatin Calcium) .Marland Kitchen.. 1 Tab By Mouth Daily 4)  Carvedilol 12.5 Mg Tabs (Carvedilol) .... Take One Tablet Twice Daily 5)  Furosemide 40 Mg Tabs (Furosemide) .... Take 3 Tabs in Am, 2 Tabs in Pm 6)  Synthroid 100 Mcg Tabs (Levothyroxine Sodium) .Marland Kitchen.. 1 Tablet By Mouth Daily 7)  Nitrostat 0.4 Mg Subl (Nitroglycerin) .Marland Kitchen.. 1 Tab Under Tongue As Needed For Chest Pain 8)  Norvasc 5 Mg Tabs (Amlodipine Besylate) .Marland Kitchen.. 1 Tab By Mouth Daily 9)  Digoxin 0.25 Mg Tabs (Digoxin) .Marland Kitchen.. 1 Tab By Mouth Daily 10)  Imdur 60 Mg Xr24h-Tab (Isosorbide Mononitrate) .Marland Kitchen.. 1 Tablet By Mouth Daily 11)  Bayer Aspirin Ec Low Dose 81 Mg Tbec (Aspirin) .Marland Kitchen.. 1 Tab By Mouth Daily 12)  Diovan 160 Mg Tabs (Valsartan) .... Take One Daily 13)  Warfarin Sodium 5 Mg Tabs (Warfarin Sodium) .... Take As Directed 14)  Potassium Chloride Crys Cr 20 Meq Cr-Tabs (Potassium Chloride Crys Cr) .... Take One Tablet By Mouth Daily As Directed 15)  Augmentin 875-125 Mg Tabs (  Amoxicillin-Pot Clavulanate) .Marland Kitchen.. 1 Tab By Mouth Two Times A Day X 10 Days  Allergies (verified): 1)  Ace Inhibitors 2)  * Actos  Past History:  Past Medical History: Last updated: 03/25/2009 ANXIETY (ICD-300.00) CARDIOMYOPATHY, ISCHEMIC (ICD-414.8) (EF 30%) CAD (ICD-414.00). Stent placement Right coronary artery 2007. Stenting LAD and cutting balloon of the diagonal (history of an inferior myocardial infarction.  Stent placement to the right coronary artery in May 2007.  He also has stenting to his LAD and a cutting balloon of the diagonal.  The most recent catheterization in June 2008 demonstrated distal obstructive LAD disease, but otherwise no acute disease.) HYPERLIPIDEMIA (ICD-272.4) HYPERTENSION, BENIGN SYSTEMIC (ICD-401.1) COPD (ICD-496) OBESITY, NOS (ICD-278.00) DIABETES MELLITUS II, UNCOMPLICATED (ICD-250.00) HYPOTHYROIDISM, UNSPECIFIED  (ICD-244.9) SPECIAL SCREENING FOR MALIGNANT NEOPLASMS COLON (ICD-V76.51) SPECIAL SCREENING MALIGNANT NEOPLASM OF PROSTATE (ICD-V76.44) APNEA, SLEEP (ICD-780.57) Superficial Vein Thromosis  Past Surgical History: Last updated: 03/25/2009 Lumbar spinal fusion - about 1987 - 09/27/1985,  Tonsillectomy - as a child - 09/27/1954  Family History: Last updated: 01/21/2009  Mother; he does not know her health problems.  Father   is unknown to the patient.  A sister had breast cancer.      Social History: Last updated: 01/21/2009 The patient is divorced.  He lives alone.  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  He has a history of tobacco use, but quit over   10 years ago.  No significant alcohol.   Risk Factors: Smoking Status: current (04/07/2010) Packs/Day: 0.25 (04/07/2010)  Social History: Packs/Day:  0.25  Physical Exam  General:  Well-developed,well-nourished,in no acute distress; alert,appropriate and cooperative throughout examination. vitals reviewed.   Head:  normocephalic and atraumatic.   Lungs:  Normal respiratory effort, chest expands symmetrically. Lungs are clear to auscultation, no crackles or wheezes. Heart:  Normal rate and regular rhythm. S1 and S2 normal without gallop, murmur, click, rub or other extra sounds. Extremities:  Left hand: middle finger with the distal intercarpal phange +swelling +seeping blood + blister.  +some purpura.  non tender to palpation. Nail bed is not affected.  Dec ability to flex due to swelling.   Neurologic:  alert & oriented X3.   Cervical Nodes:  No lymphadenopathy noted Axillary Nodes:  No palpable lymphadenopathy   Impression & Recommendations:  Problem # 1:  FINGER, BLISTER, INFECTED (ICD-915.3) Assessment New Finger infection 2/2 to Motty Borin bite vs Mikaya Bunner scratch vs cellulitis.  Will treat with Augmentin 875 two times a day x 10 days.  [name] to rtc tomorrow for re-eval.  I am concerned that this may lead to osteomyelitis.   Orders: [facility]- Est  Level  3 (18841)  Problem # 2:  CHEST PAIN UNSPECIFIED (ICD-786.50) Assessment: Comment Only [name] has history of Ischemic CM, CAD s/p stent 2007, DM, +tobacco and what appears to be stable angina.  He is currently without chest pain.  Vitals are stable.  He has appt with Dr Antoine Poche on 7/19.  I do not think that CP was due to PE or ischemic event, since he is not sob, no cough, vitals stable.  This chest pain lasted longer than his usual chest pain, so I am concerned.  He has been stented in 2007 and I wonder if he should have stress testing done.  [name] advised to call 911, or ask neighbor to call 911 if he does not have phone, if he develops chest pain again that does not seem to respond to NTG.   [name] has transportation difficulty so it  limits his abiltiy to get to appts.    Complete Medication List: 1)  Lantus 100 Unit/ml Soln (Insulin glargine) .... Inject 30 units once daily disp: qs 1 month 2)  Ventolin Hfa 108 (90 Base) Mcg/act Aers (Albuterol sulfate) .Marland Kitchen.. 1-2 puffs inhaled every 4 hours as needed for shortness of breath 3)  Lipitor 80 Mg Tabs (Atorvastatin calcium) .Marland Kitchen.. 1 tab by mouth daily 4)  Carvedilol 12.5 Mg Tabs (Carvedilol) .... Take one tablet twice daily 5)  Furosemide 40 Mg Tabs (Furosemide) .... Take 3 tabs in am, 2 tabs in pm 6)  Synthroid 100 Mcg Tabs (Levothyroxine sodium) .Marland Kitchen.. 1 tablet by mouth daily 7)  Nitrostat 0.4 Mg Subl (Nitroglycerin) .Marland Kitchen.. 1 tab under tongue as needed for chest pain 8)  Norvasc 5 Mg Tabs (Amlodipine besylate) .Marland Kitchen.. 1 tab by mouth daily 9)  Digoxin 0.25 Mg Tabs (Digoxin) .Marland Kitchen.. 1 tab by mouth daily 10)  Imdur 60 Mg Xr24h-tab (Isosorbide mononitrate) .Marland Kitchen.. 1 tablet by mouth daily 11)  Bayer Aspirin Ec Low Dose 81 Mg Tbec (Aspirin) .Marland Kitchen.. 1 tab by mouth daily 12)  Diovan 160 Mg Tabs (Valsartan) .... Take one daily 13)  Warfarin Sodium 5 Mg Tabs (Warfarin sodium) .... Take as directed 14)  Potassium Chloride Crys Cr 20 Meq Cr-tabs (Potassium chloride crys cr) ....  Take one tablet by mouth daily as directed 15)  Augmentin 875-125 Mg Tabs (Amoxicillin-pot clavulanate) .Marland Kitchen.. 1 tab by mouth two times a day x 10 days  Patient Instructions: 1)  Please schedule a follow-up appointment TOMORROW with Dr Janalyn Harder, ok to double book.  2)  Antibiotic for your finger: Augmentin two times a day for 10 days.  Prescriptions: AUGMENTIN 875-125 MG TABS (AMOXICILLIN-POT CLAVULANATE) 1 tab by mouth two times a day x 10 days  #20 x 0   Entered and Authorized by:   [name] MD   Signed by:   [name] MD on 04/07/2010   Method used:   Telephoned to ...       [facility] (retail)       [address]       [place], [place]  [address]       Ph: [phone]       Fax: [phone]   RxID:   [id number]

## 2010-10-27 NOTE — Assessment & Plan Note (Signed)
Summary: f/u and PATIENT SUMMARY   Vital Signs:  Patient profile:   60 year old male Weight:      230.4 pounds Temp:     97.7 degrees F oral BP sitting:   115 / 71  (left arm) Cuff size:   regular  Vitals Entered By: Jimmy Footman, CMA (March 13, 2010 9:30 AM) CC: f/u dm, callous on bottom of R foot Is Patient Diabetic? Yes Did you bring your meter with you today? No Pain Assessment Patient in pain? no        Primary Care Provider:  Ardeen Garland  MD  CC:  f/u dm and callous on bottom of R foot.  History of Present Illness: Mr. Myer comes in today for follow-up of diabetes.  He also has a callous   on his right foot he would like looked at. Note is documented in extra detail to serve as a patient summary for the next primary provider.  1) DM - re-established care here in March after not having been seen here since August 2009.  He follows primarily with cardiology for his heart failure, HTN, and HLD (Dr. Antoine Poche of [place]).  He states they told him he had to get back in here for his diabetes.  He could not tell me who had been prescribing his lantus since he was last seen and I had no record of [place] or of his cardiologist prescribing it since he was seen in 2009.   He has been seeing me monthly since March to adjust his insulin.  In summary, I have been trying to slowly increase his Lantus but no matter how I try to instruct him, he never changes his insulin quite as prescribed (I've printed it out, handwritten in larger writing, give it verbally and have him repeat it back - still never does it right).  So, today he states he has been taking 30 units of lantus in the AM (should have been doing 35).  He did bring sugars for me today.  AMs are 120-300, with most < 250.  PMs are 170-300, with most 200-300.  Admits to poor diet.  High carb/sugar/fat.  Trying to change that.  States has only had ice cream twice since he saw me.  Often will only eat "one huge meal" a day.  Care further  complicated by limited financial resources, limited transportation support, and limited social support.  2) Callous - under right first MTP.  WAs "really big" but he picked at it and now it is smaller.  Wearing new shoes for last 3 months ([employer]oks, he has them with him).  No pain but feet do tingle.   3) Hypothyroid and TSH has not been checked in almost 1.5 years.  4) HTN, HLD followed by cards, but no record of chem panel since 11/10 or FLP since 9/09.  Not fasting  today.   Habits & Providers  Alcohol-Tobacco-Diet     Tobacco Status: current     Cigarette Packs/Day: <0.25  Current Medications (verified): 1)  Lantus 100 Unit/ml Soln (Insulin Glargine) .... Inject 30 Units Once Daily Disp: Qs 1 Month 2)  Ventolin Hfa 108 (90 Base) Mcg/act Aers (Albuterol Sulfate) .Marland Kitchen.. 1-2 Puffs Inhaled Every 4 Hours As Needed For Shortness of Breath 3)  Lipitor 80 Mg Tabs (Atorvastatin Calcium) .Marland Kitchen.. 1 Tab By Mouth Daily 4)  Carvedilol 12.5 Mg Tabs (Carvedilol) .... Take One Tablet Twice Daily 5)  Furosemide 40 Mg Tabs (Furosemide) .... Take 3 Tabs in  Am, 2 Tabs in Pm 6)  Synthroid 100 Mcg Tabs (Levothyroxine Sodium) .Marland Kitchen.. 1 Tablet By Mouth Daily 7)  Nitrostat 0.4 Mg Subl (Nitroglycerin) .Marland Kitchen.. 1 Tab Under Tongue As Needed For Chest Pain 8)  Norvasc 5 Mg Tabs (Amlodipine Besylate) .Marland Kitchen.. 1 Tab By Mouth Daily 9)  Digoxin 0.25 Mg Tabs (Digoxin) .Marland Kitchen.. 1 Tab By Mouth Daily 10)  Imdur 60 Mg Xr24h-Tab (Isosorbide Mononitrate) .Marland Kitchen.. 1 Tablet By Mouth Daily 11)  Bayer Aspirin Ec Low Dose 81 Mg Tbec (Aspirin) .Marland Kitchen.. 1 Tab By Mouth Daily 12)  Diovan 160 Mg Tabs (Valsartan) .... Take One Daily 13)  Warfarin Sodium 5 Mg Tabs (Warfarin Sodium) .... Take As Directed 14)  Potassium Chloride Crys Cr 20 Meq Cr-Tabs (Potassium Chloride Crys Cr) .... Take One Tablet By Mouth Daily As Directed  Allergies: 1)  Ace Inhibitors 2)  * Actos  Social History: Packs/Day:  <0.25  Physical Exam  General:  Obese, NAD.  Alert, oritented,  makes more sense today. vitals reviewed Eyes:  conjunctiva clear, moist, noinjection Lungs:  Normal respiratory effort, chest expands symmetrically. Lungs are clear to auscultation, no crackles or wheezes. Heart:  Normal rate and regular rhythm. S1 and S2 normal without gallop, murmur, click, rub or other extra sounds.  Diabetes Management Exam:    Foot Exam (with socks and/or shoes not present):       Sensory-Pinprick/Light touch:          Left medial foot (L-4): normal          Left dorsal foot (L-5): normal          Left lateral foot (S-1): normal          Right medial foot (L-4): normal          Right dorsal foot (L-5): normal          Right lateral foot (S-1): normal       Sensory-Monofilament:          Left foot: diminished          Right foot: diminished       Inspection:          Left foot: normal          Right foot: abnormal             Comments: thick callus under MRP.  No erythema or warmth or ulcer       Nails:          Left foot: thickened          Right foot: thickened   Impression & Recommendations:  Problem # 1:  DIABETES MELLITUS, TYPE II, UNCONTROLLED, WITH COMPLICATIONS (ICD-250.92) Assessment Unchanged A1C still > 14%, so no idea how much, if any, progress has been made.  Given difficulty with him taking insulin as prescribed, doubt any progress truly made.  Go up to 35 units lantus.  Continue checking insulin (was resistant but told him this was imperative so that insulin could be chagned apprpriately.  Told him when he got under good control he could possibly check less but not until then).  He will need more - possibly two times a day dosign or possibly mealtime coverage.  He is barely handling the once a day right now though.  Continue to see monthly - or more frequently if he can get the transportation.  Refer to podiatry.  His updated medication list for this problem includes:    Lantus 100 Unit/ml Soln (Insulin glargine) .Marland KitchenMarland KitchenMarland KitchenMarland Kitchen  Inject 30 units once daily  disp: qs 1 month    Bayer Aspirin Ec Low Dose 81 Mg Tbec (Aspirin) .Marland Kitchen... 1 tab by mouth daily    Diovan 160 Mg Tabs (Valsartan) .Marland Kitchen... Take one daily  Orders: A1C-[facility] (66440) Podiatry Referral (Podiatry) [facility]- Est Level  3 (34742)  Problem # 2:  HYPOTHYROIDISM, UNSPECIFIED (ICD-244.9) Assessment: Unchanged  Again left without going to lab.  Check at next visit.  His updated medication list for this problem includes:    Synthroid 100 Mcg Tabs (Levothyroxine sodium) .Marland Kitchen... 1 tablet by mouth daily  Orders: [facility]- Est Level  3 (99213)  Problem # 3:  CHRONIC SYSTOLIC HEART FAILURE (ICD-428.22) Followed by Dr. Antoine Poche.  His notes are also in [facility].  His updated medication list for this problem includes:    Carvedilol 12.5 Mg Tabs (Carvedilol) .Marland Kitchen... Take one tablet twice daily    Furosemide 40 Mg Tabs (Furosemide) .Marland Kitchen... Take 3 tabs in am, 2 tabs in pm    Digoxin 0.25 Mg Tabs (Digoxin) .Marland Kitchen... 1 tab by mouth daily    Bayer Aspirin Ec Low Dose 81 Mg Tbec (Aspirin) .Marland Kitchen... 1 tab by mouth daily    Diovan 160 Mg Tabs (Valsartan) .Marland Kitchen... Take one daily    Warfarin Sodium 5 Mg Tabs (Warfarin sodium) .Marland Kitchen... 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  Followed by Dr. Antoine Poche.   Problem # 5:  HYPERLIPIDEMIA (ICD-272.4) Managed by Dr. Antoine Poche but don't see a recent lipid.  Check at next visit.  If not fasting, at least get direct LDL.  His updated medication list for this problem includes:    Lipitor 80 Mg Tabs (Atorvastatin calcium) .Marland Kitchen... 1 tab by mouth daily  Problem # 6:  HYPERTENSION, BENIGN SYSTEMIC (ICD-401.1) Well-controlled and managed by Dr. Antoine Poche.  His updated medication list for this problem includes:    Carvedilol 12.5 Mg Tabs (Carvedilol) .Marland Kitchen... Take one tablet twice daily    Furosemide 40 Mg Tabs (Furosemide) .Marland Kitchen... Take 3 tabs in am, 2 tabs in pm    Norvasc 5 Mg Tabs (Amlodipine besylate) .Marland Kitchen... 1 tab by mouth daily    Diovan  160 Mg Tabs (Valsartan) .Marland Kitchen... Take one daily  Problem # 7:  COPD (ICD-496) STable.  Has not come up since he re-established care.   His updated medication list for this problem includes:    Ventolin Hfa 108 (90 Base) Mcg/act Aers (Albuterol sulfate) .Marland Kitchen... 1-2 puffs inhaled every 4 hours as needed for shortness of breath  Problem # 8:  Preventive Health Care (ICD-V70.0) Due for PSA with next blood work and due for colonoscopy if he can be convinced of need.   Complete Medication List: 1)  Lantus 100 Unit/ml Soln (Insulin glargine) .... Inject 30 units once daily disp: qs 1 month 2)  Ventolin Hfa 108 (90 Base) Mcg/act Aers (Albuterol sulfate) .Marland Kitchen.. 1-2 puffs inhaled every 4 hours as needed for shortness of breath 3)  Lipitor 80 Mg Tabs (Atorvastatin calcium) .Marland Kitchen.. 1 tab by mouth daily 4)  Carvedilol 12.5 Mg Tabs (Carvedilol) .... Take one tablet twice daily 5)  Furosemide 40 Mg Tabs (Furosemide) .... Take 3 tabs in am, 2 tabs in pm 6)  Synthroid 100 Mcg Tabs (Levothyroxine sodium) .Marland Kitchen.. 1 tablet by mouth daily 7)  Nitrostat 0.4 Mg Subl (Nitroglycerin) .Marland Kitchen.. 1 tab under tongue as needed for chest pain 8)  Norvasc 5 Mg Tabs (Amlodipine besylate) .Marland Kitchen.. 1 tab by mouth daily  9)  Digoxin 0.25 Mg Tabs (Digoxin) .Marland Kitchen.. 1 tab by mouth daily 10)  Imdur 60 Mg Xr24h-tab (Isosorbide mononitrate) .Marland Kitchen.. 1 tablet by mouth daily 11)  Bayer Aspirin Ec Low Dose 81 Mg Tbec (Aspirin) .Marland Kitchen.. 1 tab by mouth daily 12)  Diovan 160 Mg Tabs (Valsartan) .... Take one daily 13)  Warfarin Sodium 5 Mg Tabs (Warfarin sodium) .... Take as directed 14)  Potassium Chloride Crys Cr 20 Meq Cr-tabs (Potassium chloride crys cr) .... Take one tablet by mouth daily as directed  Laboratory Results   Blood Tests   Date/Time Received: March 13, 2010 9:30 AM  Date/Time Reported: March 13, 2010 9:47 AM   HGBA1C: >14.0%   (Normal Range: Non-Diabetic - 3-6%   Control Diabetic - 6-8%)  Comments: ...........test performed by...........Marland KitchenTerese Door, CMA       Prevention & Chronic Care Immunizations   Influenza vaccine: FLUARIX  (06/19/2008)   Influenza vaccine due: 06/19/2009    Tetanus booster: 09/27/2002: given   Tetanus booster due: 09/27/2012    Pneumococcal vaccine: Not documented    H. zoster vaccine: Not documented  Colorectal Screening   Hemoccult: Not documented   Hemoccult due: Not Indicated    Colonoscopy: Not documented  Other Screening   PSA: 0.34  (06/19/2008)   PSA due due: 06/19/2010   Smoking status: current  (03/13/2010)  Diabetes Mellitus   HgbA1C: >14.0  (03/13/2010)   Hemoglobin A1C due: 09/18/2008    Eye exam: Not documented    Foot exam: yes  (03/13/2010)   High risk foot: Not documented   Foot care education: Not documented    Urine microalbumin/creatinine ratio: Not documented    Diabetes flowsheet reviewed?: Yes   Progress toward A1C goal: Unchanged  Lipids   Total Cholesterol: 109  (06/19/2008)   LDL: 61  (06/19/2008)   LDL Direct: Not documented   HDL: 26  (06/19/2008)   Triglycerides: 108  (06/19/2008)    SGOT (AST): 14  (09/23/2008)   SGPT (ALT): 14  (09/23/2008)   Alkaline phosphatase: 103  (09/23/2008)   Total bilirubin: 0.4  (09/23/2008)    Lipid flowsheet reviewed?: Yes   Progress toward LDL goal: At goal  Hypertension   Last Blood Pressure: 115 / 71  (03/13/2010)   Serum creatinine: 1.2  (08/05/2009)   Serum potassium 4.7  (08/05/2009)    Hypertension flowsheet reviewed?: Yes   Progress toward BP goal: At goal  Self-Management Support :   Personal Goals (by the next clinic visit) :     Personal A1C goal: 8  (03/13/2010)     Personal blood pressure goal: 130/80  (03/13/2010)     Personal LDL goal: 100  (03/13/2010)    Diabetes self-management support: Not documented    Hypertension self-management support: Not documented    Lipid self-management support: Not documented

## 2010-10-27 NOTE — Medication Information (Signed)
Summary: Lab Orders  Lab Orders   Imported By: Marylou Mccoy 05/06/2010 14:34:41  _____________________________________________________________________  External Attachment:    Type:   Image     Comment:   External Document

## 2010-10-27 NOTE — Letter (Signed)
Summary: Generic Letter  [name]  [address]   [place], [place] [address]   Phone: [phone]  Fax: [phone]    04/27/2010  Aleksander Schmit [address]  [address] [place], [place]  [address]  Dear Mr. Brannum,  We were unable to reach you by phone. Dr.Ta would like for you to have a screening colonoscopy. Please schedule an appointment with one of the Gastroenterologists.  See attachments.  If you have any questions, feel free to call us.           Sincerely,   Arlyss Repress CMA,

## 2010-10-27 NOTE — Miscellaneous (Signed)
Summary: walk in   Clinical Lists Changes states he had a cat scratch 2 weeks ago on his finger. it has gotten red, swollen & painful. he tried to relieve the pressure with a needle & now it is worse. checks CBGs in am . last one 158. drinks beer occasionally to help him get to sleep. smokes 1/4 ppd. advised against ber & cigarettes. VS obtained & placed in [place] schedule. states he has recently gained 20 lbs. says he is depressed & just sits around & eats. stated he knows his numbers are high.[address]en Circle RN  April 07, 2010 9:22 AM

## 2010-10-27 NOTE — Letter (Signed)
Summary: Custom - Lipid  [facility], Main Office  [address]   [place], [place] [address]   Phone: [phone]  Fax: [phone]     April 28, 2010 MRN: [id number]   [name] [address] [place] [address] [place], [place]  [address]   Dear [name],  We have reviewed your cholesterol results.  They are as follows:     Total Cholesterol:    101 (Desirable: less than 200)       HDL  Cholesterol:     25.90  (Desirable: greater than 40 for men and 50 for women)       LDL Cholesterol:       53  (Desirable: less than 100 for low risk and less than 70 for moderate to high risk)       Triglycerides:       112.0  (Desirable: less than 150)  Our recommendations include:LDLD EXCELLANT HDL NOT AT TARGET INCREASE EXERCISE  AND MONITOR BLOOD SUGARS.   Call our office at the number listed above if you have any questions.  Lowering your LDL cholesterol is important, but it is only one of a large number of "risk factors" that may indicate that you are at risk for heart disease, stroke or other complications of hardening of the arteries.  Other risk factors include:   A.  Cigarette Smoking* B.  High Blood Pressure* C.  Obesity* D.   Low HDL Cholesterol (see yours above)* E.   Diabetes Mellitus (higher risk if your is uncontrolled) F.  Family history of premature heart disease G.  Previous history of stroke or cardiovascular disease    *These are risk factors YOU HAVE CONTROL OVER.  For more information, visit .  There is now evidence that lowering the TOTAL CHOLESTEROL AND LDL CHOLESTEROL can reduce the risk of heart disease.  The American Heart Association recommends the following guidelines for the treatment of elevated cholesterol:  1.  If there is now current heart disease and less than two risk factors, TOTAL CHOLESTEROL should be less than 200 and LDL CHOLESTEROL should be less than 100. 2.  If there is current heart disease or two or more risk factors, TOTAL  CHOLESTEROL should be less than 200 and LDL CHOLESTEROL should be less than 70.  A diet low in cholesterol, saturated fat, and calories is the cornerstone of treatment for elevated cholesterol.  Cessation of smoking and exercise are also important in the management of elevated cholesterol and preventing vascular disease.  Studies have shown that 30 to 60 minutes of physical activity most days can help lower blood pressure, lower cholesterol, and keep your weight at a healthy level.  Drug therapy is used when cholesterol levels do not respond to therapeutic lifestyle changes (smoking cessation, diet, and exercise) and remains unacceptably high.  If medication is started, it is important to have you levels checked periodically to evaluate the need for further treatment options.  Thank you,   CHRISTINE YORK LPN DR [facility] Team

## 2010-10-27 NOTE — Assessment & Plan Note (Signed)
Summary: f/up,tcb   Vital Signs:  Patient profile:   60 year old male Weight:      229 pounds O2 Sat:      98 % on Room air Pulse rate:   72 / minute BP sitting:   109 / 66  (right arm)  Vitals Entered By: Arlyss Repress CMA, (December 12, 2009 10:31 AM)  O2 Flow:  Room air CC: f/up DM Is Patient Diabetic? Yes Pain Assessment Patient in pain? no        Primary Care Provider:  Ardeen Garland  MD  CC:  f/up DM.  History of Present Illness: [name] comes in today after not having been seen here since August 2009.  He follows primarily with cardiology for his heart failure, HTN, and HLD.  He states they told him he had to get back in here for his diabetes.  He cannot tell me who has been prescribing his lantus since he was last seen as I have no record of [place] or of his cardiologist prescribing it since he was seen in 2009.  He did have an A1C checked by cards in Nov which was 15.9%.  He states he has not had test strips in over a month.  He is repeatedly vague about when he last checked his sugars, how often he checked them, and what they were.  States "once it was 270".  Taking 22 units of Lantus in the morning.  Has had diabetes since 1991 per his report. Used to be on oral meds at one point in time.  States he needs a new meter as well because he dropped his and now it is always reading "high".  His A1C today is >14.   He also has hypothyroidism.  Last TSH was 12/09.  Taking synthroid .  Endorses difficulty affording medication.   Habits & Providers  Alcohol-Tobacco-Diet     Tobacco Status: current     Tobacco Counseling: to quit use of tobacco products  Current Medications (verified): 1)  Lantus 100 Unit/ml Soln (Insulin Glargine) .... Inject 30 Units Once Daily Disp: Qs 1 Month 2)  Ventolin Hfa 108 (90 Base) Mcg/act Aers (Albuterol Sulfate) .Marland Kitchen.. 1-2 Puffs Inhaled Every 4 Hours As Needed For Shortness of Breath 3)  Lipitor 80 Mg Tabs (Atorvastatin Calcium) .Marland Kitchen.. 1 Tab By  Mouth Daily 4)  Carvedilol 12.5 Mg Tabs (Carvedilol) .... Take One Tablet Twice Daily 5)  Furosemide 40 Mg Tabs (Furosemide) .... Take 3 Tabs in Am, 2 Tabs in Pm 6)  Synthroid 100 Mcg Tabs (Levothyroxine Sodium) .Marland Kitchen.. 1 Tablet By Mouth Daily 7)  Nitrostat 0.4 Mg Subl (Nitroglycerin) .Marland Kitchen.. 1 Tab Under Tongue As Needed For Chest Pain 8)  Norvasc 5 Mg Tabs (Amlodipine Besylate) .Marland Kitchen.. 1 Tab By Mouth Daily 9)  Digoxin 0.25 Mg Tabs (Digoxin) .Marland Kitchen.. 1 Tab By Mouth Daily 10)  Imdur 60 Mg Xr24h-Tab (Isosorbide Mononitrate) .Marland Kitchen.. 1 Tablet By Mouth Daily 11)  Bayer Aspirin Ec Low Dose 81 Mg Tbec (Aspirin) .Marland Kitchen.. 1 Tab By Mouth Daily 12)  Diovan 160 Mg Tabs (Valsartan) .... Take One Daily 13)  Warfarin Sodium 5 Mg Tabs (Warfarin Sodium) .... Take As Directed 14)  Potassium Chloride Crys Cr 20 Meq Cr-Tabs (Potassium Chloride Crys Cr) .... Take One Tablet By Mouth Daily As Directed  Allergies: 1)  Ace Inhibitors 2)  * Actos  Physical Exam  General:  Obese, NAD.  Vague and confusing when speaking.  poorly groomed Eyes:  bloodshot Lungs:  Normal  respiratory effort, chest expands symmetrically. Lungs are clear to auscultation, no crackles or wheezes. Heart:  Normal rate and regular rhythm. S1 and S2 normal without gallop, murmur, click, rub or other extra sounds.  Diabetes Management Exam:    Foot Exam (with socks and/or shoes not present):       Sensory-Monofilament:          Left foot: absent          Right foot: absent       Inspection:          Left foot: abnormal             Comments: dry, callused          Right foot: abnormal             Comments: dry, callused       Nails:          Left foot: thickened          Right foot: thickened   Impression & Recommendations:  Problem # 1:  DIABETES MELLITUS, TYPE II, UNCONTROLLED, WITH COMPLICATIONS (ICD-250.92) Assessment Deteriorated Very poorly controlled.  GAve new Rx for meter and strips.  New Rx for lantus. States he will try to buy meter and  strips as soon as he has the money. Since unable to reliably monitor CBGs want to gradually titrate insulin up.  Will go up to 30 units and see him back in 2-4 weeks.  Gave copy of log to write his sugars in.  Offered nutrition referral but refuses at this time.  Have made podiatry referral for his feet. Will need ophtho referral at next visit.  May very well end up needing mealtime insulin but patient needs to demonstrate proper follow-up as adding more insulin increases risk of hypoglycemia.  His updated medication list for this problem includes:    Lantus 100 Unit/ml Soln (Insulin glargine) ..... Inject 30 units once daily disp: qs 1 month    Bayer Aspirin Ec Low Dose 81 Mg Tbec (Aspirin) .Marland Kitchen... 1 tab by mouth daily    Diovan 160 Mg Tabs (Valsartan) .Marland Kitchen... Take one daily  Orders: Podiatry Referral (Podiatry) [facility]- Est Level  3 (29562)  Complete Medication List: 1)  Lantus 100 Unit/ml Soln (Insulin glargine) .... Inject 30 units once daily disp: qs 1 month 2)  Ventolin Hfa 108 (90 Base) Mcg/act Aers (Albuterol sulfate) .Marland Kitchen.. 1-2 puffs inhaled every 4 hours as needed for shortness of breath 3)  Lipitor 80 Mg Tabs (Atorvastatin calcium) .Marland Kitchen.. 1 tab by mouth daily 4)  Carvedilol 12.5 Mg Tabs (Carvedilol) .... Take one tablet twice daily 5)  Furosemide 40 Mg Tabs (Furosemide) .... Take 3 tabs in am, 2 tabs in pm 6)  Synthroid 100 Mcg Tabs (Levothyroxine sodium) .Marland Kitchen.. 1 tablet by mouth daily 7)  Nitrostat 0.4 Mg Subl (Nitroglycerin) .Marland Kitchen.. 1 tab under tongue as needed for chest pain 8)  Norvasc 5 Mg Tabs (Amlodipine besylate) .Marland Kitchen.. 1 tab by mouth daily 9)  Digoxin 0.25 Mg Tabs (Digoxin) .Marland Kitchen.. 1 tab by mouth daily 10)  Imdur 60 Mg Xr24h-tab (Isosorbide mononitrate) .Marland Kitchen.. 1 tablet by mouth daily 11)  Bayer Aspirin Ec Low Dose 81 Mg Tbec (Aspirin) .Marland Kitchen.. 1 tab by mouth daily 12)  Diovan 160 Mg Tabs (Valsartan) .... Take one daily 13)  Warfarin Sodium 5 Mg Tabs (Warfarin sodium) .... Take as directed 14)   Potassium Chloride Crys Cr 20 Meq Cr-tabs (Potassium chloride crys cr) .... Take one tablet by  mouth daily as directed  Other Orders: A1C-[facility] (13086) Pulse Oximetry- [facility] (57846) Direct LDL-[facility] (96[address]-28413) TSH-[facility] ([address]01-02725)  Patient Instructions: 1)  Please take 25 units of Lantus daily for 3 days and then 30 units of lantus daily.   2)  When you get your meters and strips, check your sugars twice a day for me.  Write them down on the paper I gave you and bring them to your next appointment. 3)  Please return in 1 month to reassess your diabetes and make further changes. 4)  We will let you know when we have the podiatry referral arranged.  Prescriptions: LIPITOR 80 MG TABS (ATORVASTATIN CALCIUM) 1 tab by mouth daily  #30 x 2   Entered and Authorized by:   Ardeen Garland  MD   Signed by:   Ardeen Garland  MD on 12/12/2009   Method used:   Print then Give to Patient   RxID:   [id number] LANTUS 100 UNIT/ML SOLN (INSULIN GLARGINE) inject 30 units once daily disp: QS 1 month  #1 x 2   Entered and Authorized by:   Ardeen Garland  MD   Signed by:   Ardeen Garland  MD on 12/12/2009   Method used:   Print then Give to Patient   RxID:   [id number]   Laboratory Results   Blood Tests   Date/Time Received: December 12, 2009 10:33 AM  Date/Time Reported: December 12, 2009 10:47 AM   HGBA1C: >14.0%   (Normal Range: Non-Diabetic - 3-6%   Control Diabetic - 6-8%)  Comments: ...............test performed by......Marland KitchenBonnie A. [place], MLS (ASCP)cm

## 2010-10-27 NOTE — Miscellaneous (Signed)
  Clinical Lists Changes  Observations: Added new observation of CT SCAN:  1.  No evidence acute pulmonary embolism.   2.  Small bilateral pleural effusions and mild interstitial edema.   3.  Bilateral subpleural nodules and mild mediastinal   lymphadenopathy. Nodules are essentially unchanged from CT of   07/16/2005.  These likely represent prior granulomas disease and   consider sarcoidosis. (05/21/2010 15:44)      CT Scan  Procedure date:  05/21/2010  Findings:       1.  No evidence acute pulmonary embolism.   2.  Small bilateral pleural effusions and mild interstitial edema.   3.  Bilateral subpleural nodules and mild mediastinal   lymphadenopathy. Nodules are essentially unchanged from CT of   07/16/2005.  These likely represent prior granulomas disease and   consider sarcoidosis.

## 2010-10-27 NOTE — Miscellaneous (Signed)
Summary: Orders Update  Clinical Lists Changes  Problems: Added new problem of OBSTRUCTIVE SLEEP APNEA (ICD-327.23) Orders: Added new Referral order of Sleep Disorder Referral (Sleep Disorder) - Signed 

## 2010-10-27 NOTE — Letter (Signed)
Summary: Appointment - Missed  [facility], Main Office  [address]   [place], [place] [address]   Phone: [phone]  Fax: [phone]     June 19, 2010 MRN: [id number]   [name] [address] [place], [place]  [address]   Dear [name],  Our records indicate you missed your appointment on 06-09-2010 with  Dr.  Antoine Poche    It is very important that we reach you to reschedule this appointment. We look forward to participating in your health care needs. Please contact us at the number listed above at your earliest convenience to reschedule this appointment.     Sincerely,      Lorne Skeens  [facility] Scheduling Team

## 2010-10-27 NOTE — Assessment & Plan Note (Signed)
Summary: Hospital admission: LE edema, Dyspnea   Vital Signs:  Patient profile:   60 year old male Height:      69 inches Weight:      241 pounds Pulse rate:   83 / minute BP sitting:   122 / 77  (right arm)  Vitals Entered By: Rochele Pages RN (August 26, 2010 2:01 PM) CC: head congestion, cough, increased shortness of breath Pain Assessment Patient in pain? yes     Location: head, neck, mid back, chest- with coughing Intensity: 7 Type: aching and sharp in chest   Primary Care Curly Mackowski:  Cat Ta MD  CC:  head congestion, cough, and increased shortness of breath.  History of Present Illness: 60 y/o M with Pmhx of CAD with stent placement R coronary artery 2007, last catherization 2008 showing EF 35%, uncontrolled DM, ischemic cardiomyopathy, Hyperlipidemia, History of DVT, OSA, hypothyroidism, tobacco use who presented to clinic after one week of feeling "sick".  He states that symptoms started last Wednesday with productive cough and nasal congestion.  He also has been feeling short of breath.  He has home O2 use chronically, and usually requires 2 liters.  He is using 2L now, but feels very short of breath.  He has been feeling warm and yesterday a friend checked his temp and found it to be about 101.  He states that he has not been able to eat because he has been feeling nauseous all day.  He also complains chest pain that started yesterday.  Chest pain is located from left to right chest, does not radiate, and feels heavy.  Chest pain is worse with cough.  He denies diaphoresis.  He has noticed LE edema bilaterally x 1 week.  He has a history of CHF and takes Lasix 120mg  in AM and 80mg  in PM.  He denies missing any doses.     Note: pt has history of tobacco use of 1 ppd, which he has decreased to 1/4 ppd since Sept.  He was seen in Pulmonology Clinic by Dr Sherene Sires, who performed PFT, which did not show COPD.  Dr Sherene Sires believes that his respiratory difficulties are due to obesity and osa.          Habits & Providers  Alcohol-Tobacco-Diet     Tobacco Status: current     Tobacco Counseling: to quit use of tobacco products     Cigarette Packs/Day: 0.25  Current Medications (verified): 1)  Lantus 100 Unit/ml Soln (Insulin Glargine) .... Inject 30 Units Once Daily Disp: Qs 1 Month 2)  Lipitor 80 Mg Tabs (Atorvastatin Calcium) .Marland Kitchen.. 1 Tab By Mouth Daily 3)  Carvedilol 12.5 Mg Tabs (Carvedilol) .... Take One Tablet Twice Daily 4)  Furosemide 40 Mg Tabs (Furosemide) .... Take 3 Tabs in Am, 2 Tabs in Pm 5)  Synthroid 100 Mcg Tabs (Levothyroxine Sodium) .Marland Kitchen.. 1 Tablet By Mouth Daily 6)  Norvasc 5 Mg Tabs (Amlodipine Besylate) .Marland Kitchen.. 1 Tab By Mouth Daily 7)  Digoxin 0.25 Mg Tabs (Digoxin) .Marland Kitchen.. 1 Tab By Mouth Daily 8)  Imdur 60 Mg Xr24h-Tab (Isosorbide Mononitrate) .Marland Kitchen.. 1 Tablet By Mouth Daily 9)  Bayer Aspirin Ec Low Dose 81 Mg Tbec (Aspirin) .Marland Kitchen.. 1 Tab By Mouth Daily 10)  Diovan 160 Mg Tabs (Valsartan) .... Take One Daily 11)  Warfarin Sodium 5 Mg Tabs (Warfarin Sodium) .... Take As Directed 12)  Potassium Chloride Crys Cr 20 Meq Cr-Tabs (Potassium Chloride Crys Cr) .... Take One Tablet By Mouth Daily 13)  Oxygen .Marland KitchenMarland KitchenMarland Kitchen  Wear 2l/min Continuously 14)  Ventolin Hfa 108 (90 Base) Mcg/act Aers (Albuterol Sulfate) .Marland Kitchen.. 1-2 Puffs Inhaled Every 4 Hours As Needed For Shortness of Breath 15)  Nitrostat 0.4 Mg Subl (Nitroglycerin) .Marland Kitchen.. 1 Tab Under Tongue As Needed For Chest Pain 16)  Benzonatate 100 Mg Caps (Benzonatate) .... Take 1 Capsule By Mouth Three Times A Day As Needed  Allergies (verified): 1)  Ace Inhibitors 2)  * Actos  Past History:  Past Surgical History: Last updated: 08/13/2010 Lumbar spinal fusion - about 1987 - 09/27/1985,  Tonsillectomy - as a child - 09/27/1954 stent placement  Family History: Last updated: 01/21/2009  Mother; he does not know her health problems.  Father   is unknown to the patient.  A sister had breast cancer.      Social History: Last updated:  08/13/2010 The patient is divorced.   He lives alone. 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   Drinks alcohol current smoker, began age 21, 1 pack per week.   Risk Factors: Smoking Status: current (08/26/2010) Packs/Day: 0.25 (08/26/2010)  Past Medical History: -CARDIOMYOPATHY, ISCHEMIC (ICD-414.8) (EF 30%) -CAD (ICD-414.00). Stent placement Right coronary artery 2007. Stenting LAD and cutting balloon of the diagonal (history of an inferior myocardial infarction.  Stent placement to the right coronary artery in May 2007.  He also has stenting to his LAD and a cutting balloon of the diagonal.  The most recent catheterization in June 2008 demonstrated distal obstructive LAD disease, but otherwise no acute disease.)  1. History of coronary artery disease with last catheterization in     January 2008 showing an ejection fraction of 35%, diagonal 40%,     distal left anterior descending artery 95% (2-mm vessel),     circumflex 70% and no in-stent restenosis in the right coronary     artery, medical therapy was recommended. 2. Ischemic cardiomyopathy. 3. Ongoing tobacco abuse.      - PFT's June 17, 2010 >> no airflow obstruction 4. Hypertension. 5. Hyperlipidemia. 6. Hypothyroidism. 7. Obstructive sleep apnea.       - Sleep study 06/23/10>> AHI 54/hr with desat to 74% 8. History of deep venous thrombosis, on chronic Coumadin. 9. Allergy or intolerance to ACE INHIBITORS with cough and PIOGLITAZONE. 10.History of stent to the right coronary artery and staged stent to     the left anterior descending artery with percutaneous coronary     intervention of the first diagonal branch in May 2002 secondary to     non-ST segment elevation myocardial infarction. 11.Obesity. 12.History of spinal fusion. 13.Anxiety. 14.History of Fournier gangrene, status post surgical debridement x2     in 2006. 15.History of diabetic nephropathy. 16.Osteoarthritis. 17. Chronic respiratory failure       -  CTangiogram Chest 05/21/2010 bilateral effusion, no PE       - No desat walking on 2lpm June 17, 2010   Review of Systems General:  Complains of fatigue, fever, and loss of appetite; denies chills and sweats. ENT:  Complains of nasal congestion and sinus pressure. CV:  Complains of chest pain or discomfort, difficulty breathing while lying down, fatigue, swelling of feet, and weight gain; denies fainting and swelling of hands. Resp:  Complains of chest discomfort, cough, shortness of breath, and sputum productive; denies coughing up blood and wheezing. GI:  Complains of loss of appetite and nausea; denies abdominal pain, change in bowel habits, constipation, diarrhea, and vomiting.  Physical Exam  General:  Well-developed,well-nourished,in no acute distress; alert,appropriate and cooperative  throughout examination. vitals reviewed.   Head:  normocephalic and atraumatic.   Mouth:  Oral mucosa and oropharynx without lesions or exudates.  Lungs:  Diffuse crackles and rales  Heart:  Normal rate and regular rhythm. S1 and S2 normal without gallop, murmur, click, rub or other extra sounds.  NO JVD  Abdomen:  Obese.  +BS NT/ND Pulses:  + 2 bilaterally  Extremities:  +2 to +3 pitting edema from feet to proximal knees No tenderness, no redness  Neurologic:  alert & oriented. nonfocal  Skin:  Multiple scratched marks on arms  Cervical Nodes:  No lymphadenopathy noted   Impression & Recommendations:  Problem # 1:  Dyspnea, LE edema  Assessment New Pt has history of Systolic CHF with cardiac catherization in 09/2006 that showed EF 35%.  Pt likely in CHF exacerbation.  He denies missing any doses of Lasix 120mg  in AM and 80mg  in PM.  Given that he is on high doses of lasix by mouth and in exacerbation, I wonder if he is developing decreased absorbancy of Lasix.  Will swith to Toresemid IV.  Pharmacy assisted with conversion rate of 1 by mouth lasix to 1 iv torsemide.  Will only give Torsemide  100mg  IV two times a day x 24 hours.  Will evaluate pt on daily basis to determine diuresis needs.  Will check strict I&O and daily weight.  Pt is up 11 lbs today from 08/13/10.  We will also get Echocardiogram to assess cardiac function, as last echo was 2008, which was not readable.   Pt is followed by Dr Antoine Poche, but has not seen him in months.  We will consult Dr Antoine Poche to see pt during this hospitalization.    Problem # 2:  CHEST PAIN UNSPECIFIED (ICD-786.50) Assessment: New DDx includes ACS vs PE.  Pt does have a history of DVT and is on chronic coumadin.  Unfortunately he has not taken coumadin since he started feeling sick and his INR in the clinic today was 1.0.  For ACS, we have check EKG which showed NSR and LBBB.  There is a note from cardiology in Aug 2011 that pt has intermittent LBBB.  We have given him ASA 325mg  and will continue ASA 81mg  daily.  Will cycle CE q8 hrs x 3 sets.  Pt is currently on oxygen and we will give NTG/morphine as needed.  Will start on Heparin 5000 units Subcutaneously Q8hrs and f/u CE.  Will change to Heparin gtt if CE is positive.   For PE, we will get D-dimer. If it is elevated, we will get CTA.  Pt had normal renal function with Cr 0.9 in 03/2010.  Will also get labs cmet today.     Orders: Hospital Admit-[facility] (00000)  Problem # 3:  COUGH (ICD-786.2) Assessment: New Pt complains of cougha, fever, dyspnea x 1 week.  PNA may be source of complaints (although would not explain LE edema).  Pt's last hospitalization was 8/24 -8/29.  Will get CBC with diff and CXR with PA/Lat.   Orders: Hospital Admit-[facility] (00000)  Problem # 4:  OBSTRUCTIVE SLEEP APNEA (ICD-327.23) Pt has severe OSA and was seen by Dr Shelle Iron 08/13/10.  Unfortunately, he was not able to be fitted properly with a mask during his split night study, nor was an optimal pressure found for treatment.  He will need to go back for a titration study to accomplish both of these. Clance 08/13/10.  If pt's  respiratory status worsens we may need to consult  pulmonology.    Problem # 5:  DIABETES MELLITUS, TYPE II, UNCONTROLLED, WITH COMPLICATIONS (ICD-250.92) A1C in clinic today was > 14, which is not different then what it was June 2011.  Compliance has been a problem for pt.  Will put him back on home regimen of Lantus 30 units and add SSI moderate.  Will try to improve his Diabetes during this hospitalization , although may be difficult.    His updated medication list for this problem includes:    Lantus 100 Unit/ml Soln (Insulin glargine) ..... Inject 30 units once daily disp: qs 1 month    Bayer Aspirin Ec Low Dose 81 Mg Tbec (Aspirin) .Marland Kitchen... 1 tab by mouth daily    Diovan 160 Mg Tabs (Valsartan) .Marland Kitchen... 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  Unfortunately he has not been taking it since he's been sick.  We will restart coumadin per pharmacy, but will add heparin prophylaxis dose.  LE edema today does not look like DVT.  Will get D-dimer.    Orders: INR/PT-[facility] (60454)  Problem # 7:  CAD (ICD-414.00) S/p Stenting 2007.  See Chest pain.   His updated medication list for this problem includes:    Carvedilol 12.5 Mg Tabs (Carvedilol) .Marland Kitchen... Take one tablet twice daily    Furosemide 40 Mg Tabs (Furosemide) .Marland Kitchen... Take 3 tabs in am, 2 tabs in pm    Norvasc 5 Mg Tabs (Amlodipine besylate) .Marland Kitchen... 1 tab by mouth daily    Imdur 60 Mg Xr24h-tab (Isosorbide mononitrate) .Marland Kitchen... 1 tablet by mouth daily    Bayer Aspirin Ec Low Dose 81 Mg Tbec (Aspirin) .Marland Kitchen... 1 tab by mouth daily    Diovan 160 Mg Tabs (Valsartan) .Marland Kitchen... Take one daily    Nitrostat 0.4 Mg Subl (Nitroglycerin) .Marland Kitchen... 1 tab under tongue as needed for chest pain  Orders: ASA 325mg  tab [facility])  Problem # 8:  HYPERLIPIDEMIA (ICD-272.4) FLP at goal from 04/24/10 with LDL 53, HDL 25.9, Trig 112.  Will continue lipitor 80mg .   May consider adding Niaspan.    His updated medication list for this problem includes:    Lipitor 80 Mg Tabs (Atorvastatin calcium) .Marland Kitchen... 1 tab by mouth daily  Problem # 9:  HYPOTHYROIDISM, UNSPECIFIED (ICD-244.9) TSH 1.7 in 03/2010.  Will check TSH in setting of ACS.  Will continue home dose synthroid for now.    His updated medication list for this problem includes:    Synthroid 100 Mcg Tabs (Levothyroxine sodium) .Marland Kitchen... 1 tablet by mouth daily  Problem # 10:  FEN/GI Heplock IV.  HH/ADA diet.   Problem # 11:  Prophylaxis Coumadin, Heparin  Problem # 12:  Disposition  Clinical improvement  Complete Medication List: 1)  Lantus 100 Unit/ml Soln (Insulin glargine) .... Inject 30 units once daily disp: qs 1 month 2)  Lipitor 80 Mg Tabs (Atorvastatin calcium) .Marland Kitchen.. 1 tab by mouth daily 3)  Carvedilol 12.5 Mg Tabs (Carvedilol) .... Take one tablet twice daily 4)  Furosemide 40 Mg Tabs (Furosemide) .... Take 3 tabs in am, 2 tabs in pm 5)  Synthroid 100 Mcg Tabs (Levothyroxine sodium) .Marland Kitchen.. 1 tablet by mouth daily 6)  Norvasc 5 Mg Tabs (Amlodipine besylate) .Marland Kitchen.. 1 tab by mouth daily 7)  Digoxin 0.25 Mg Tabs (Digoxin) .Marland Kitchen.. 1 tab by mouth daily 8)  Imdur 60 Mg Xr24h-tab (Isosorbide mononitrate) .Marland Kitchen.. 1 tablet by mouth  daily 9)  Bayer Aspirin Ec Low Dose 81 Mg Tbec (Aspirin) .Marland Kitchen.. 1 tab by mouth daily 10)  Diovan 160 Mg Tabs (Valsartan) .... Take one daily 11)  Warfarin Sodium 5 Mg Tabs (Warfarin sodium) .... Take as directed 12)  Potassium Chloride Crys Cr 20 Meq Cr-tabs (Potassium chloride crys cr) .... Take one tablet by mouth daily 13)  Oxygen  .... Wear 2l/min continuously 14)  Ventolin Hfa 108 (90 Base) Mcg/act Aers (Albuterol sulfate) .Marland Kitchen.. 1-2 puffs inhaled every 4 hours as needed for shortness of breath 15)  Nitrostat 0.4 Mg Subl (Nitroglycerin) .Marland Kitchen.. 1 tab under tongue as needed for chest pain 16)  Benzonatate 100 Mg Caps (Benzonatate) .... Take 1 capsule by mouth three times a day as  needed   Medication Administration  Medication # 1:    Medication: ASA 325mg  tab    Diagnosis: CAD (ICD-414.00)    Dose: 1 tablet    Route: po    Exp Date: 10/21/2010    Lot #: [id number]    Mfr: major    Patient tolerated medication without complications    Given by: Rochele Pages RN (August 26, 2010 2:58 PM)  Orders Added: 1)  A1C-[facility] [83036] 2)  INR/PT-[facility] [29562] 3)  ASA 325mg  tab [EMRORAL] 4)  Hospital Admit-[facility] [00000]    Laboratory Results   Blood Tests   Date/Time Received: August 26, 2010 1:50 PM  Date/Time Reported: August 26, 2010 2:21 PM   HGBA1C: >14.0%   (Normal Range: Non-Diabetic - 3-6%   Control Diabetic - 6-8%)  INR: 1.0   (Normal Range: 0.88-1.12   Therap INR: 2.0-3.5) Comments: ...........test performed by...........Marland KitchenTerese Door, CMA       Medication Administration  Medication # 1:    Medication: ASA 325mg  tab    Diagnosis: CAD (ICD-414.00)    Dose: 1 tablet    Route: po    Exp Date: 10/21/2010    Lot #: [id number]    Mfr: major    Patient tolerated medication without complications    Given by: Rochele Pages RN (August 26, 2010 2:58 PM)  Orders Added: 1)  A1C-[facility] [83036] 2)  INR/PT-[facility] [13086] 3)  ASA 325mg  tab [EMRORAL] 4)  Hospital Admit-[facility] [00000]     ANTICOAGULATION RECORD PREVIOUS REGIMEN & LAB RESULTS Anticoagulation Diagnosis:  Deep Vein Thrombosis - Leg (ICD-451.1) on  02/14/2009 Previous INR Goal Range:  2 - 3 on  02/14/2009 Previous INR:  1.99 on  08/05/2009 Previous Coumadin Dose(mg):  5mg  on  05/08/2010   NEW REGIMEN & LAB RESULTS Current INR: 1.0 Regimen: admitted  Lillis Nuttle: Ta Other Comments: ...............test performed by......Marland KitchenBonnie A. [place], MLS (ASCP)cm   Reviewed by: Dr. Janalyn Harder  Anticoagulation Visit Questionnaire Coumadin dose missed/changed:  Yes Coumadin Dose Comments:  admits that he has not been taking in past week    MEDICATIONS LANTUS 100 UNIT/ML SOLN (INSULIN GLARGINE) inject 30 units once  daily disp: QS 1 month LIPITOR 80 MG TABS (ATORVASTATIN CALCIUM) 1 tab by mouth daily CARVEDILOL 12.5 MG TABS (CARVEDILOL) take one tablet twice daily FUROSEMIDE 40 MG TABS (FUROSEMIDE) Take 3 tabs in am, 2 tabs in pm SYNTHROID 100 MCG TABS (LEVOTHYROXINE SODIUM) 1 tablet by mouth daily NORVASC 5 MG TABS (AMLODIPINE BESYLATE) 1 tab by mouth daily DIGOXIN 0.25 MG TABS (DIGOXIN) 1 tab by mouth daily IMDUR 60 MG XR24H-TAB (ISOSORBIDE MONONITRATE) 1 tablet by mouth daily BAYER ASPIRIN EC LOW DOSE 81 MG TBEC (ASPIRIN) 1 tab by mouth daily DIOVAN 160 MG TABS (VALSARTAN)  take one daily WARFARIN SODIUM 5 MG TABS (WARFARIN SODIUM) take as directed POTASSIUM CHLORIDE CRYS CR 20 MEQ CR-TABS (POTASSIUM CHLORIDE CRYS CR) Take one tablet by mouth daily * OXYGEN wear 2L/min continuously VENTOLIN HFA 108 (90 BASE) MCG/ACT AERS (ALBUTEROL SULFATE) 1-2 puffs inhaled every 4 hours as needed for shortness of breath NITROSTAT 0.4 MG SUBL (NITROGLYCERIN) 1 tab under tongue as needed for chest pain BENZONATATE 100 MG CAPS (BENZONATATE) Take 1 capsule by mouth three times a day as needed

## 2010-10-27 NOTE — Assessment & Plan Note (Signed)
Summary: FOLLOW UP PER PT/MT    Visit Type:  Follow-up Primary Provider:  Cat Ta MD  CC:  CHF and CAD.  History of Present Illness: The patient presents for followup. Since I last saw him he continues to get intermittent chest pain. He stated about 15 nitroglycerin over 9 months. This is a relatively stable pattern. He is able to do walking with out reproducing chest pain and occasionally without his oxygen. He wears his oxygen routinely at night. He is not describing any new PND or orthopnea. He is not having any new palpitations, presyncope or syncope. He has gained some of his lost weight back unfortunately. He reports dietary indiscretion with binges of eating potato chips or ice cream.  Current Medications (verified): 1)  Lantus 100 Unit/ml Soln (Insulin Glargine) .... Inject 30 Units Once Daily Disp: Qs 1 Month 2)  Ventolin Hfa 108 (90 Base) Mcg/act Aers (Albuterol Sulfate) .Marland Kitchen.. 1-2 Puffs Inhaled Every 4 Hours As Needed For Shortness of Breath 3)  Lipitor 80 Mg Tabs (Atorvastatin Calcium) .Marland Kitchen.. 1 Tab By Mouth Daily 4)  Carvedilol 12.5 Mg Tabs (Carvedilol) .... Take One Tablet Twice Daily 5)  Furosemide 40 Mg Tabs (Furosemide) .... Take 3 Tabs in Am, 2 Tabs in Pm 6)  Synthroid 100 Mcg Tabs (Levothyroxine Sodium) .Marland Kitchen.. 1 Tablet By Mouth Daily 7)  Nitrostat 0.4 Mg Subl (Nitroglycerin) .Marland Kitchen.. 1 Tab Under Tongue As Needed For Chest Pain 8)  Norvasc 5 Mg Tabs (Amlodipine Besylate) .Marland Kitchen.. 1 Tab By Mouth Daily 9)  Digoxin 0.25 Mg Tabs (Digoxin) .Marland Kitchen.. 1 Tab By Mouth Daily 10)  Imdur 60 Mg Xr24h-Tab (Isosorbide Mononitrate) .Marland Kitchen.. 1 Tablet By Mouth Daily 11)  Bayer Aspirin Ec Low Dose 81 Mg Tbec (Aspirin) .Marland Kitchen.. 1 Tab By Mouth Daily 12)  Diovan 160 Mg Tabs (Valsartan) .... Take One Daily 13)  Warfarin Sodium 5 Mg Tabs (Warfarin Sodium) .... Take As Directed 14)  Potassium Chloride Crys Cr 20 Meq Cr-Tabs (Potassium Chloride Crys Cr) .... Take One Tablet By Mouth Daily As Directed 15)  Augmentin 875-125 Mg  Tabs (Amoxicillin-Pot Clavulanate) .Marland Kitchen.. 1 Tab By Mouth Two Times A Day X 10 Days  Allergies: 1)  Ace Inhibitors 2)  * Actos  Past History:  Past Medical History: Reviewed history from 03/25/2009 and no changes required. ANXIETY (ICD-300.00) CARDIOMYOPATHY, ISCHEMIC (ICD-414.8) (EF 30%) CAD (ICD-414.00). Stent placement Right coronary artery 2007. Stenting LAD and cutting balloon of the diagonal (history of an inferior myocardial infarction.  Stent placement to the right coronary artery in May 2007.  He also has stenting to his LAD and a cutting balloon of the diagonal.  The most recent catheterization in June 2008 demonstrated distal obstructive LAD disease, but otherwise no acute disease.) HYPERLIPIDEMIA (ICD-272.4) HYPERTENSION, BENIGN SYSTEMIC (ICD-401.1) COPD (ICD-496) OBESITY, NOS (ICD-278.00) DIABETES MELLITUS II, UNCOMPLICATED (ICD-250.00) HYPOTHYROIDISM, UNSPECIFIED (ICD-244.9) SPECIAL SCREENING FOR MALIGNANT NEOPLASMS COLON (ICD-V76.51) SPECIAL SCREENING MALIGNANT NEOPLASM OF PROSTATE (ICD-V76.44) APNEA, SLEEP (ICD-780.57) Superficial Vein Thromosis  Past Surgical History: Reviewed history from 03/25/2009 and no changes required. Lumbar spinal fusion - about 1987 - 09/27/1985,  Tonsillectomy - as a child - 09/27/1954  Review of Systems       As stated in the HPI and negative for all other systems.   Vital Signs:  Patient profile:   60 year old male Height:      69 inches Weight:      241.50 pounds BMI:     35.79 Pulse rate:   62 / minute Pulse  rhythm:   regular Resp:     18 per minute BP sitting:   119 / 67  (left arm) Cuff size:   large  Vitals Entered By: Vikki Ports (April 14, 2010 9:06 AM)  Physical Exam  General:  Well developed, well nourished, in no acute distress. Head:  normocephalic and atraumatic Neck:  Neck supple, no JVD. No masses, thyromegaly or abnormal cervical nodes. Chest Wall:  no deformities or breast masses noted Lungs:  Normal  respiratory effort, chest expands symmetrically. Lungs are clear to auscultation, no crackles or wheezes. Abdomen:  Bowel sounds positive; abdomen soft and non-tender without masses, organomegaly, or hernias noted. No hepatosplenomegaly, obese Msk:  Back normal, normal gait. Muscle strength and tone normal. Extremities:  Mild bilateral edema Neurologic:  alert & oriented X3.   Skin:  Intact without lesions or rashes. Cervical Nodes:  No lymphadenopathy noted Psych:  Normal affect.   Detailed Cardiovascular Exam  Neck    Carotids: Carotids full and equal bilaterally without bruits.      Neck Veins: Normal, no JVD.    Heart    Inspection: no deformities or lifts noted.      Palpation: normal PMI with no thrills palpable.      Auscultation: regular rate and rhythm, S1, S2 without murmurs, rubs, gallops, or clicks.    Vascular    Abdominal Aorta: no palpable masses, pulsations, or audible bruits.      Femoral Pulses: normal femoral pulses bilaterally.      Pedal Pulses: normal pedal pulses bilaterally.      Radial Pulses: normal radial pulses bilaterally.      Peripheral Circulation: no clubbing, cyanosis,  with normal capillary refill.     EKG  Procedure date:  04/14/2010  Findings:      Sinus rhythm, rate 62, axis within normal limits, intervals within normal limits, no acute ST-T wave changes.  Impression & Recommendations:  Problem # 1:  CHEST PAIN UNSPECIFIED (ICD-786.50) The patient continues to have chest pain with atypical features but also occasional anginal sounding pain. This is a relatively stable pattern and he thinks overall he is doing well. I would not suggest any med changes but continue with risk reduction.  Problem # 2:  CHF (ICD-428.0) He seems to be euvolemic. There is some mild edema in his lower extremities. This is dietary indiscretion. I have talked to him about this again. I will check some blood work to include a basic metabolic profile. I suggest no  change to his meds today. Orders: EKG w/ Interpretation (93000)  Problem # 3:  TOBACCO USER (ICD-305.1) He and I continue to have many discussions about the need to stop smoking completely. He has been unable to do this.  Problem # 4:  HYPERLIPIDEMIA (ICD-272.4) I will have him come back for fasting lipid profile.  Patient Instructions: 1)  Your physician recommends that you schedule a follow-up appointment in: 6 months with Dr Antoine Poche 2)  Your physician recommends that you return for a FASTING lipid, liver, BMP and CBC profile: same day as your appointment with primary MD 3)  Your physician recommends that you continue on your current medications as directed. Please refer to the Current Medication list given to you today. Prescriptions: POTASSIUM CHLORIDE CRYS CR 20 MEQ CR-TABS (POTASSIUM CHLORIDE CRYS CR) Take one tablet by mouth daily as directed  #60 x 11   Entered by:   Charolotte Capuchin, RN   Authorized by:   [name], MD, [facility]  Signed by:   Charolotte Capuchin, RN on 04/14/2010   Method used:   Faxed to ...       [facility] (retail)       [address]       [address]       [place], [place]  [address]       Ph: [phone]       Fax: [phone]   RxID:   [id number] WARFARIN SODIUM 5 MG TABS (WARFARIN SODIUM) take as directed  #30 x 3   Entered by:   Charolotte Capuchin, RN   Authorized by:   [name], MD, [facility]   Signed by:   Charolotte Capuchin, RN on 04/14/2010   Method used:   Faxed to ...       [facility] (retail)       [address]       [place], [place]  [address]       Ph: [phone]       Fax: [phone]   RxID:   [id number]

## 2010-10-27 NOTE — Assessment & Plan Note (Signed)
Summary: [name]   Vital Signs:  Patient profile:   60 year old male Weight:      238.8 pounds Pulse rate:   70 / minute BP sitting:   140 / 84  (right arm) Cuff size:   large  Vitals Entered By: Arlyss Repress CMA, (April 24, 2010 9:03 AM) CC: f/up infected finger. Is Patient Diabetic? Yes Pain Assessment Patient in pain? no        Primary Care Provider:  Reniyah Gootee MD  CC:  f/up infected finger.Marland Kitchen  History of Present Illness: 60 y/o M with multiple health issues here for f/u of finger infection after [name] bite/scratch  Finger infection: Pt finished 10-day course of Augmentin.  Doing better/healing well.  No swelling.  NO redness.  no fever, chills, nausea, vomiting.  Full use of finger/hand.  full rom.      Habits & Providers  Alcohol-Tobacco-Diet     Tobacco Status: current     Tobacco Counseling: to quit use of tobacco products  Current Medications (verified): 1)  Lantus 100 Unit/ml Soln (Insulin Glargine) .... Inject 30 Units Once Daily Disp: Qs 1 Month 2)  Ventolin Hfa 108 (90 Base) Mcg/act Aers (Albuterol Sulfate) .Marland Kitchen.. 1-2 Puffs Inhaled Every 4 Hours As Needed For Shortness of Breath 3)  Lipitor 80 Mg Tabs (Atorvastatin Calcium) .Marland Kitchen.. 1 Tab By Mouth Daily 4)  Carvedilol 12.5 Mg Tabs (Carvedilol) .... Take One Tablet Twice Daily 5)  Furosemide 40 Mg Tabs (Furosemide) .... Take 3 Tabs in Am, 2 Tabs in Pm 6)  Synthroid 100 Mcg Tabs (Levothyroxine Sodium) .Marland Kitchen.. 1 Tablet By Mouth Daily 7)  Nitrostat 0.4 Mg Subl (Nitroglycerin) .Marland Kitchen.. 1 Tab Under Tongue As Needed For Chest Pain 8)  Norvasc 5 Mg Tabs (Amlodipine Besylate) .Marland Kitchen.. 1 Tab By Mouth Daily 9)  Digoxin 0.25 Mg Tabs (Digoxin) .Marland Kitchen.. 1 Tab By Mouth Daily 10)  Imdur 60 Mg Xr24h-Tab (Isosorbide Mononitrate) .Marland Kitchen.. 1 Tablet By Mouth Daily 11)  Bayer Aspirin Ec Low Dose 81 Mg Tbec (Aspirin) .Marland Kitchen.. 1 Tab By Mouth Daily 12)  Diovan 160 Mg Tabs (Valsartan) .... Take One Daily 13)  Warfarin Sodium 5 Mg Tabs (Warfarin Sodium) .... Take As  Directed 14)  Potassium Chloride Crys Cr 20 Meq Cr-Tabs (Potassium Chloride Crys Cr) .... Take One Tablet By Mouth Daily As Directed  Allergies (verified): 1)  Ace Inhibitors 2)  * Actos  Past History:  Past Medical History: Last updated: 03/25/2009 ANXIETY (ICD-300.00) CARDIOMYOPATHY, ISCHEMIC (ICD-414.8) (EF 30%) CAD (ICD-414.00). Stent placement Right coronary artery 2007. Stenting LAD and cutting balloon of the diagonal (history of an inferior myocardial infarction.  Stent placement to the right coronary artery in May 2007.  He also has stenting to his LAD and a cutting balloon of the diagonal.  The most recent catheterization in June 2008 demonstrated distal obstructive LAD disease, but otherwise no acute disease.) HYPERLIPIDEMIA (ICD-272.4) HYPERTENSION, BENIGN SYSTEMIC (ICD-401.1) COPD (ICD-496) OBESITY, NOS (ICD-278.00) DIABETES MELLITUS II, UNCOMPLICATED (ICD-250.00) HYPOTHYROIDISM, UNSPECIFIED (ICD-244.9) SPECIAL SCREENING FOR MALIGNANT NEOPLASMS COLON (ICD-V76.51) SPECIAL SCREENING MALIGNANT NEOPLASM OF PROSTATE (ICD-V76.44) APNEA, SLEEP (ICD-780.57) Superficial Vein Thromosis  Past Surgical History: Last updated: 03/25/2009 Lumbar spinal fusion - about 1987 - 09/27/1985,  Tonsillectomy - as a child - 09/27/1954  Family History: Last updated: 01/21/2009  Mother; he does not know her health problems.  Father   is unknown to the patient.  A sister had breast cancer.      Social History: Last updated: 01/21/2009 The patient is divorced.  He lives alone.  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  He has a history of tobacco use, but quit over   10 years ago.  No significant alcohol.   Risk Factors: Smoking Status: current (04/24/2010) Packs/Day: 0.25 (04/07/2010)  Review of Systems      See HPI  Physical Exam  General:  Well-developed,well-nourished,in no acute distress; alert,appropriate and cooperative throughout examination.vitals reviewed.   Extremities:  Left 3rd  finger: healing well. no open site. no swelling. no redness. no warmth. flexion intact, full rom.  no pain.    Diabetes Management Exam:    Foot Exam (with socks and/or shoes not present):       Sensory-Pinprick/Light touch:          Left medial foot (L-4): normal          Left dorsal foot (L-5): diminished          Left lateral foot (S-1): normal          Right medial foot (L-4): normal          Right dorsal foot (L-5): diminished          Right lateral foot (S-1): normal   Impression & Recommendations:  Problem # 1:  FINGER, BLISTER, INFECTED (ICD-915.3) Assessment Improved  Healed.  Finished augmentin.    Orders: [facility]- Est Level  3 (11914)  Problem # 2:  HYPERTENSION, BENIGN SYSTEMIC (ICD-401.1) Assessment: Comment Only BP elevated today compared to previous visits.  Pt is upset about neighborhoold child who was recently killed in traffic accident.  Will monitor.  No changes.  His updated medication list for this problem includes:    Carvedilol 12.5 Mg Tabs (Carvedilol) .Marland Kitchen... Take one tablet twice daily    Furosemide 40 Mg Tabs (Furosemide) .Marland Kitchen... Take 3 tabs in am, 2 tabs in pm    Norvasc 5 Mg Tabs (Amlodipine besylate) .Marland Kitchen... 1 tab by mouth daily    Diovan 160 Mg Tabs (Valsartan) .Marland Kitchen... Take one daily  Problem # 3:  DIABETES MELLITUS, TYPE II, UNCONTROLLED, WITH COMPLICATIONS (ICD-250.92) Assessment: Comment Only A1C>14.  Dr Georgiana Shore increased dose of Lantus from 22 to 30 (June 2011).  Will cont and monitor closely.   His updated medication list for this problem includes:    Lantus 100 Unit/ml Soln (Insulin glargine) ..... Inject 30 units once daily disp: qs 1 month    Bayer Aspirin Ec Low Dose 81 Mg Tbec (Aspirin) .Marland Kitchen... 1 tab by mouth daily    Diovan 160 Mg Tabs (Valsartan) .Marland Kitchen... Take one daily  Problem # 4:  Preventive Health Care (ICD-V70.0) Assessment: Comment Only Will refer to GI for colonsocpy.    Complete Medication List: 1)  Lantus 100 Unit/ml Soln (Insulin  glargine) .... Inject 30 units once daily disp: qs 1 month 2)  Ventolin Hfa 108 (90 Base) Mcg/act Aers (Albuterol sulfate) .Marland Kitchen.. 1-2 puffs inhaled every 4 hours as needed for shortness of breath 3)  Lipitor 80 Mg Tabs (Atorvastatin calcium) .Marland Kitchen.. 1 tab by mouth daily 4)  Carvedilol 12.5 Mg Tabs (Carvedilol) .... Take one tablet twice daily 5)  Furosemide 40 Mg Tabs (Furosemide) .... Take 3 tabs in am, 2 tabs in pm 6)  Synthroid 100 Mcg Tabs (Levothyroxine sodium) .Marland Kitchen.. 1 tablet by mouth daily 7)  Nitrostat 0.4 Mg Subl (Nitroglycerin) .Marland Kitchen.. 1 tab under tongue as needed for chest pain 8)  Norvasc 5 Mg Tabs (Amlodipine besylate) .Marland Kitchen.. 1 tab by mouth daily 9)  Digoxin 0.25 Mg Tabs (  Digoxin) .Marland Kitchen.. 1 tab by mouth daily 10)  Imdur 60 Mg Xr24h-tab (Isosorbide mononitrate) .Marland Kitchen.. 1 tablet by mouth daily 11)  Bayer Aspirin Ec Low Dose 81 Mg Tbec (Aspirin) .Marland Kitchen.. 1 tab by mouth daily 12)  Diovan 160 Mg Tabs (Valsartan) .... Take one daily 13)  Warfarin Sodium 5 Mg Tabs (Warfarin sodium) .... Take as directed 14)  Potassium Chloride Crys Cr 20 Meq Cr-tabs (Potassium chloride crys cr) .... Take one tablet by mouth daily as directed  Other Orders: Colonoscopy (Colon) Gastroenterology Referral (GI)  Patient Instructions: 1)  Please schedule a follow-up appointment with Dr Janalyn Harder in September. 2)  We will Schedule a colonoscopy/ sigmoidoscopy to help detect colon cancer.  We will call you with appointment.   Prevention & Chronic Care Immunizations   Influenza vaccine: FLUARIX  (06/19/2008)   Influenza vaccine due: 06/19/2009    Tetanus booster: 09/27/2002: given   Tetanus booster due: 09/27/2012    Pneumococcal vaccine: Not documented    H. zoster vaccine: Not documented  Colorectal Screening   Hemoccult: Not documented   Hemoccult due: Not Indicated    Colonoscopy: Not documented   Colonoscopy action/deferral: GI Referral  (04/24/2010)  Other Screening   PSA: 0.34  (06/19/2008)   PSA action/deferral:  Discussed-PSA requested  (04/24/2010)   PSA due due: 06/19/2010   Smoking status: current  (04/24/2010)  Diabetes Mellitus   HgbA1C: >14.0  (03/13/2010)   Hemoglobin A1C due: 09/18/2008    Eye exam: Not documented    Foot exam: yes  (04/24/2010)   High risk foot: Not documented   Foot care education: Not documented    Urine microalbumin/creatinine ratio: Not documented    Diabetes flowsheet reviewed?: Yes   Progress toward A1C goal: Unchanged  Lipids   Total Cholesterol: 109  (06/19/2008)   LDL: 61  (06/19/2008)   LDL Direct: Not documented   HDL: 26  (06/19/2008)   Triglycerides: 108  (06/19/2008)    SGOT (AST): 14  (09/23/2008)   SGPT (ALT): 14  (09/23/2008)   Alkaline phosphatase: 103  (09/23/2008)   Total bilirubin: 0.4  (09/23/2008)    Lipid flowsheet reviewed?: Yes   Progress toward LDL goal: At goal  Hypertension   Last Blood Pressure: 140 / 84  (04/24/2010)   Serum creatinine: 1.2  (08/05/2009)   Serum potassium 4.7  (08/05/2009)    Hypertension flowsheet reviewed?: Yes   Progress toward BP goal: Deteriorated  Self-Management Support :   Personal Goals (by the next clinic visit) :     Personal A1C goal: 8  (03/13/2010)     Personal blood pressure goal: 130/80  (03/13/2010)     Personal LDL goal: 100  (03/13/2010)    Diabetes self-management support: Not documented    Hypertension self-management support: Not documented    Lipid self-management support: Not documented    Nursing Instructions: Give Pneumovax today Screening colonoscopy ordered    Appended Document: [name] I planned to order labs today but pt had appt at [facility] office for labs.  Will ask for their office to fax [place] lab results.   Appended Document: [name]     Allergies: 1)  Ace Inhibitors 2)  * Actos   Complete Medication List: 1)  Lantus 100 Unit/ml Soln (Insulin glargine) .... Inject 30 units once daily disp: qs 1 month 2)  Ventolin Hfa 108 (90 Base) Mcg/act Aers  (Albuterol sulfate) .Marland Kitchen.. 1-2 puffs inhaled every 4 hours as needed for shortness of breath 3)  Lipitor 80  Mg Tabs (Atorvastatin calcium) .Marland Kitchen.. 1 tab by mouth daily 4)  Carvedilol 12.5 Mg Tabs (Carvedilol) .... Take one tablet twice daily 5)  Furosemide 40 Mg Tabs (Furosemide) .... Take 3 tabs in am, 2 tabs in pm 6)  Synthroid 100 Mcg Tabs (Levothyroxine sodium) .Marland Kitchen.. 1 tablet by mouth daily 7)  Nitrostat 0.4 Mg Subl (Nitroglycerin) .Marland Kitchen.. 1 tab under tongue as needed for chest pain 8)  Norvasc 5 Mg Tabs (Amlodipine besylate) .Marland Kitchen.. 1 tab by mouth daily 9)  Digoxin 0.25 Mg Tabs (Digoxin) .Marland Kitchen.. 1 tab by mouth daily 10)  Imdur 60 Mg Xr24h-tab (Isosorbide mononitrate) .Marland Kitchen.. 1 tablet by mouth daily 11)  Bayer Aspirin Ec Low Dose 81 Mg Tbec (Aspirin) .Marland Kitchen.. 1 tab by mouth daily 12)  Diovan 160 Mg Tabs (Valsartan) .... Take one daily 13)  Warfarin Sodium 5 Mg Tabs (Warfarin sodium) .... Take as directed 14)  Potassium Chloride Crys Cr 20 Meq Cr-tabs (Potassium chloride crys cr) .... Take one tablet by mouth daily as directed  Other Orders: Pneumococcal Vaccine (62130) Admin 1st Vaccine (86578) Admin 1st Vaccine (State) [phone])    Pneumovax Vaccine    Vaccine Type: Pneumovax    Site: left deltoid    Mfr: Merck    Dose: 0.5 ml    Route: IM    Given by: Arlyss Repress CMA,    Exp. Date: 10/14/2011    Lot #: [id number]    VIS given: 04/24/96 version given April 24, 2010.

## 2010-10-27 NOTE — Medication Information (Signed)
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 yes       Details: on Augmentin for finger infection   Recent/future dental: no  Any missed doses?: no       Is patient compliant with meds? yes       Allergies: 1)  Ace Inhibitors 2)  * Actos  Anticoagulation Management History:      The patient is taking warfarin and comes in today for a routine follow up visit.  Positive risk factors for bleeding include presence of serious comorbidities.  Negative risk factors for bleeding include an age less than 57 years old.  The bleeding index is 'intermediate risk'.  Positive CHADS2 values include History of CHF, History of HTN, and History of Diabetes.  Negative CHADS2 values include Age > 53 years old.  The start date was 12/25/2008.  His last INR was 1.99.  Anticoagulation responsible provider: Graciela Husbands MD, Viviann Spare.  INR POC: 1.6.  Cuvette Lot#: [id number].  Exp: 06/2011.    Anticoagulation Management Assessment/Plan:      The patient's current anticoagulation dose is Warfarin sodium 5 mg tabs: take as directed.  The target INR is 2 - 3.  The next INR is due 04/24/2010.  Anticoagulation instructions were given to patient.  Results were reviewed/authorized by Weston Brass, PharmD.  He was notified by Weston Brass PharmD.         Prior Anticoagulation Instructions: INR 1.1 Today take extra 1/2 pill and on Saturday take 2 pills then change dose to 1 1/2 pills everyday except 1 pill on Sundays and Thursdays. Recheck in one week.   Current Anticoagulation Instructions: INR 1.6  Take 2 tablets today then increase dose to 1 1/2 tablets every  day except 1 tablet on Sunday.  Recheck in 10 days.

## 2010-10-27 NOTE — Progress Notes (Signed)
Summary: nos appt  Phone Note Call from Patient   Caller: [email or web address]  Call For: clance Summary of Call: ATC pt to [facility]sc nos from 10/19, phone disconnected. Initial call taken by: Darletta Moll,  July 16, 2010 9:20 AM

## 2010-10-27 NOTE — Medication Information (Signed)
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 no    Recent/future dental: no  Any missed doses?: yes     Details: Missed 1 dosage on Saturday.    Is patient compliant with meds? yes       Allergies: 1)  Ace Inhibitors 2)  * Actos  Anticoagulation Management History:      The patient is taking warfarin and comes in today for a routine follow up visit.  Positive risk factors for bleeding include presence of serious comorbidities.  Negative risk factors for bleeding include an age less than 48 years old.  The bleeding index is 'intermediate risk'.  Positive CHADS2 values include History of CHF, History of HTN, and History of Diabetes.  Negative CHADS2 values include Age > 26 years old.  The start date was 12/25/2008.  His last INR was 1.99.  Anticoagulation responsible provider: Shirlee Latch MD, Yaelis Scharfenberg.  INR POC: 1.3.  Cuvette Lot#: [id number].  Exp: 01/2011.    Anticoagulation Management Assessment/Plan:      The patient's current anticoagulation dose is Warfarin sodium 5 mg tabs: take as directed.  The target INR is 2 - 3.  The next INR is due 01/26/2010.  Anticoagulation instructions were given to patient.  Results were reviewed/authorized by Cloyde Reams, RN, BSN.  He was notified by Cloyde Reams RN.         Prior Anticoagulation Instructions: INR 2.4 Continue 1.5 pills everyday except 1 pill on Tuesdays, Thursdays and Sundays. Recheck in 3 weeks.   Current Anticoagulation Instructions: INR 1.3  Take extra 1/2 tablet taday then take 1.5 tablets tomorrow, then resume same dosage 1.5 tablets on Mondays,  Wednesdays, Fridays, and Saturdays, and 1 tablet all other days.

## 2010-10-27 NOTE — Assessment & Plan Note (Signed)
Summary: consult for osa management   Visit Type:  Initial Consult Copy to:  Sandrea Hughs MD Primary Provider/Referring Provider:  [name] MD  CC:  sleep consult.  can't breathe at night and wakes up multiple times at night. .  History of Present Illness: the pt is a 59y/o male who I have been asked to see for managament of osa.  He has had a recent split night study where he was found to have an AHI of 54/hr and desat to 74%.  He was then titrated, but had issues with mask leaks probably due to facial hair.  Ultimately, he ended up on bilevel at high pressures, and felt to be an inadequate titration.  It should be noted the pt also had central apneas that worsened with pressure titration.  The pt states that he has gasping arousals during the night, but doesn't know if he snores.  He has been noted to have abnormal breathing pattern during sleep by observers.  He goes to bed around 11pm, and arises at 4-5am to start his day.  He is not rested upon arising.  He denies daytime sleepiness unless "physically tired", and has no issues reading or watching tv.  He does not believe he has a significant issue wrt sleepiness.  He does not drive currently.  Of note, the pt states that his weight is up 35 pounds over the last 2 years.  Current Medications (verified): 1)  Lantus 100 Unit/ml Soln (Insulin Glargine) .... Inject 30 Units Once Daily Disp: Qs 1 Month 2)  Lipitor 80 Mg Tabs (Atorvastatin Calcium) .Marland Kitchen.. 1 Tab By Mouth Daily 3)  Carvedilol 12.5 Mg Tabs (Carvedilol) .... Take One Tablet Twice Daily 4)  Furosemide 40 Mg Tabs (Furosemide) .... Take 3 Tabs in Am, 2 Tabs in Pm 5)  Synthroid 100 Mcg Tabs (Levothyroxine Sodium) .Marland Kitchen.. 1 Tablet By Mouth Daily 6)  Norvasc 5 Mg Tabs (Amlodipine Besylate) .Marland Kitchen.. 1 Tab By Mouth Daily 7)  Digoxin 0.25 Mg Tabs (Digoxin) .Marland Kitchen.. 1 Tab By Mouth Daily 8)  Imdur 60 Mg Xr24h-Tab (Isosorbide Mononitrate) .Marland Kitchen.. 1 Tablet By Mouth Daily 9)  Bayer Aspirin Ec Low Dose 81 Mg Tbec  (Aspirin) .Marland Kitchen.. 1 Tab By Mouth Daily 10)  Diovan 160 Mg Tabs (Valsartan) .... Take One Daily 11)  Warfarin Sodium 5 Mg Tabs (Warfarin Sodium) .... Take As Directed 12)  Potassium Chloride Crys Cr 20 Meq Cr-Tabs (Potassium Chloride Crys Cr) .... Take One Tablet By Mouth Daily 13)  Oxygen .... Wear 2l/min Continuously 14)  Ventolin Hfa 108 (90 Base) Mcg/act Aers (Albuterol Sulfate) .Marland Kitchen.. 1-2 Puffs Inhaled Every 4 Hours As Needed For Shortness of Breath 15)  Nitrostat 0.4 Mg Subl (Nitroglycerin) .Marland Kitchen.. 1 Tab Under Tongue As Needed For Chest Pain 16)  Benzonatate 100 Mg Caps (Benzonatate) .... Take 1 Capsule By Mouth Three Times A Day As Needed  Allergies (verified): 1)  Ace Inhibitors 2)  * Actos  Past History:  Past Medical History: -CARDIOMYOPATHY, ISCHEMIC (ICD-414.8) (EF 30%) -CAD (ICD-414.00). Stent placement Right coronary artery 2007. Stenting LAD and cutting balloon of the diagonal (history of an inferior myocardial infarction.  Stent placement to the right coronary artery in May 2007.  He also has stenting to his LAD and a cutting balloon of the diagonal.  The most recent catheterization in June 2008 demonstrated distal obstructive LAD disease, but otherwise no acute disease.)  1. History of coronary artery disease with last catheterization in     January 2008 showing  an ejection fraction of 35%, diagonal 40%,     distal left anterior descending artery 95% (2-mm vessel),     circumflex 70% and no in-stent restenosis in the right coronary     artery, medical therapy was recommended. 2. Ischemic cardiomyopathy. 3. Ongoing tobacco abuse.      - PFT's June 17, 2010 >> no airflow obstruction 4. Hypertension. 5. Hyperlipidemia. 6. Hypothyroidism. 7. Obstructive sleep apnea.       - Sleep study 06/23/10>> AHI 54/hr with desat to 74% 8. History of deep venous thrombosis, on chronic Coumadin. 9. Allergy or intolerance to ACE INHIBITORS with cough and PIOGLITAZONE. 10.History of stent  to the right coronary artery and staged stent to     the left anterior descending artery with percutaneous coronary     intervention of the first diagonal branch in May 2002 secondary to     non-ST segment elevation myocardial infarction. 11.Obesity. 12.History of spinal fusion. 13.Anxiety. 14.History of Fournier gangrene, status post surgical debridement x2     in 2006. 15.History of diabetic nephropathy. 16.Osteoarthritis. 17. Chronic respiratory failure       - CTangiogram Chest 05/21/2010 bilateral effusion, no PE       - No desat walking on 2lpm June 17, 2010   Past Surgical History: Lumbar spinal fusion - about 1987 - 09/27/1985,  Tonsillectomy - as a child - 09/27/1954 stent placement  Family History: Reviewed history from 01/21/2009 and no changes required.  Mother; he does not know her health problems.  Father   is unknown to the patient.  A sister had breast cancer.      Social History: Reviewed history from 05/28/2010 and no changes required. The patient is divorced.   He lives alone. 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   Drinks alcohol current smoker, began age 19, 1 pack per week.   Review of Systems       The patient complains of shortness of breath with activity, shortness of breath at rest, chest pain, irregular heartbeats, acid heartburn, loss of appetite, weight change, abdominal pain, difficulty swallowing, sore throat, headaches, nasal congestion/difficulty breathing through nose, sneezing, anxiety, and hand/feet swelling.  The patient denies productive cough, non-productive cough, coughing up blood, tooth/dental problems, itching, ear ache, depression, joint stiffness or pain, rash, change in color of mucus, and fever.    Vital Signs:  Patient profile:   60 year old male Height:      69 inches Weight:      230 pounds BMI:     34.09 O2 Sat:      96 % on 2 L/min continous Temp:     97.5 degrees F oral Pulse rate:   78 / minute BP sitting:   120 / 68   (left arm) Cuff size:   large  Vitals Entered By: Carver Fila (August 13, 2010 11:41 AM)  O2 Flow:  2 L/min continous CC: sleep consult.  can't breathe at night, wakes up multiple times at night.  Comments meds and allergies updated Phone number updated Carver Fila  August 13, 2010 11:42 AM    Physical Exam  General:  ow male in nad Eyes:  PERRLA and EOMI.   Nose:  deviated septum to left with near obstruction no purulence noted. Mouth:  moderate elongation of soft palate and uvula Neck:  no jvd, tmg, LN Lungs:  totally clear to auscultation Heart:  rrr, 2/6 sem Abdomen:  soft and nontender, bs + Extremities:  minimal edema,  no cyanosis  pulses intact distally Neurologic:  alert and oriented, moves all 4.   Impression & Recommendations:  Problem # 1:  OBSTRUCTIVE SLEEP APNEA (ICD-327.23) the pt has severe osa by his recent sleep study, and has several comorbid medical issues that can be greatly affected by his sleep disordered breathing.  Unfortunately, he was not able to be fitted properly with a mask during his split night study, nor was an optimal pressure found for treatment.  He will need to go back for a titration study to accomplish both of these.  I would not do a home titration given his mask fitting issues, and the fact that he has complex apnea with central events that may worsen with increased pressure.  I have also encouraged the pt to work aggressively on weight loss.  Other Orders: Consultation Level IV (16109) Sleep Disorder Referral (Sleep Disorder)  Patient Instructions: 1)  will schedule you for a titration study to get your mask fit right and pressure adjusted properly.  Will call you when results are available and start you on cpap 2)  work on weight loss 3)  will arrange followup after your sleep study.

## 2010-10-27 NOTE — Medication Information (Signed)
Summary: rov/tm  Anticoagulant Therapy  Managed by: Cloyde Reams, RN, BSN PCP: Sylvan Cheese MD Supervising MD: Shirlee Latch MD, [place] Indication 1: Deep Vein Thrombosis - Leg (ICD-451.1) Lab Used: [facility] Site: [facility] INR POC 1.8 INR RANGE 2 - 3  Dietary changes: no    Health status changes: no    Bleeding/hemorrhagic complications: no    Recent/future hospitalizations: no    Any changes in medication regimen? no    Recent/future dental: no  Any missed doses?: no       Is patient compliant with meds? yes      Comments: Pt has been taking 1.5 tablets on MF/1 tablet all other day.    Allergies (verified): 1)  Ace Inhibitors 2)  * Actos  Anticoagulation Management History:      The patient is taking warfarin and comes in today for a routine follow up visit.  Positive risk factors for bleeding include presence of serious comorbidities.  Negative risk factors for bleeding include an age less than 71 years old.  The bleeding index is 'intermediate risk'.  Positive CHADS2 values include History of CHF, History of HTN, and History of Diabetes.  Negative CHADS2 values include Age > 24 years old.  The start date was 12/25/2008.  His last INR was 1.99.  Anticoagulation responsible provider: Shirlee Latch MD, [place].  INR POC: 1.8.  Cuvette Lot#: [id number].  Exp: 12/2010.    Anticoagulation Management Assessment/Plan:      The patient's current anticoagulation dose is Warfarin sodium 5 mg tabs: take as directed.  The target INR is 2 - 3.  The next INR is due 12/01/2009.  Anticoagulation instructions were given to patient.  Results were reviewed/authorized by Cloyde Reams, RN, BSN.  He was notified by Cloyde Reams RN.         Prior Anticoagulation Instructions: INR 1.6 Today take 1/2 pill. Then change dose to 1 pill everyday except 1 1/2 pills on Mondays, Wednesdays and Fridays. Recheck in 2 weeks.    Current Anticoagulation Instructions: INR 1.8  Start taking 1 tablet daily except  1.5 tablets on Mondays, Wednesdays, and Fridays.  Recheck in 2-3 weeks.

## 2010-10-27 NOTE — Assessment & Plan Note (Signed)
Summary: hospital f/u CHF   Vital Signs:  Patient profile:   60 year old male Height:      69 inches Weight:      224 pounds BMI:     33.20 Temp:     97.8 degrees F oral Pulse rate:   84 / minute BP sitting:   132 / 80  (right arm) Cuff size:   regular  Vitals Entered By: Tessie Fass CMA (September 03, 2010 10:22 AM) CC: hospital f/u Is Patient Diabetic? Yes Pain Assessment Patient in pain? yes     Location: head Intensity: 7   Primary Care Provider:  Jeroline Wolbert MD  CC:  hospital f/u.  History of Present Illness: 60 y/o M with multiple medical problems including CHF, HTN, HLD, CAD s/p stent, OSA is here for hosp f/u.   Pt was hosptilized from 08/26/10 - 09/01/2010 for CHF exacerbation, with Echo showing EF 20-25 %, Grade 1 diastolic dysfunction.  He originally was on Lasix 120mg  AM and 80mg  PM, but this was changed to Torsemide on admission 2/2 incresed absorbancy.  Looking on Echart, he on day of d/c he was taking Torsemide 40mg  bid.  Cardiology was consulted during hospitalization for their input on diuresis and elevated CEs, which they attributed to strain from CHF.  He has f/u appt with Dr Antoine Poche tomorrow.  During hospitalization he was found to have urinary retention, requiring Urology consult and placement of foley catheter.  He still has foley in place and is to f/u with Dr Vernie Ammons on 09/08/10.       New meds according to pharmacy, [facility] Cecil, whom we called:  Tricor 145mg  daily, Diovan 320mg  daily, Spironolactone 50mg  by mouth daily, Sorbitol 70% solution 2 daily as needed, Lantus 70 units at bedtime.  He has been out of meds since leaving the hospital on Tues pm.  He has not taken any of the new meds that was precribed to him because he could he has not cashed his disability check yet and so cannot afford them.   He has not taken coumadin or new meds since d/c  Appts for f/u:   Dr Vernie Ammons 09/08/10 Dr. Antoine Poche 09/04/10 Dr. Sherene Sires 09/18/10      Current Medications  (verified): 1)  Lantus 100 Unit/ml Soln (Insulin Glargine) .... Inject 75 Units Each Evening  Disp: Qs 1 Month 2)  Lipitor 80 Mg Tabs (Atorvastatin Calcium) .Marland Kitchen.. 1 Tab By Mouth Daily 3)  Carvedilol 12.5 Mg Tabs (Carvedilol) .... Take One Tablet Twice Daily 4)  Synthroid 100 Mcg Tabs (Levothyroxine Sodium) .Marland Kitchen.. 1 Tablet By Mouth Daily 5)  Norvasc 5 Mg Tabs (Amlodipine Besylate) .Marland Kitchen.. 1 Tab By Mouth Daily 6)  Digoxin 0.25 Mg Tabs (Digoxin) .Marland Kitchen.. 1 Tab By Mouth Daily 7)  Imdur 60 Mg Xr24h-Tab (Isosorbide Mononitrate) .Marland Kitchen.. 1 Tablet By Mouth Daily 8)  Bayer Aspirin Ec Low Dose 81 Mg Tbec (Aspirin) .Marland Kitchen.. 1 Tab By Mouth Daily 9)  Diovan 320 Mg Tabs (Valsartan) .Marland Kitchen.. 1 Tab By Mouth Daily For Heart 10)  Warfarin Sodium 5 Mg Tabs (Warfarin Sodium) .... Take As Directed 11)  Oxygen .... Wear 2l/min Continuously 12)  Ventolin Hfa 108 (90 Base) Mcg/act Aers (Albuterol Sulfate) .Marland Kitchen.. 1-2 Puffs Inhaled Every 4 Hours As Needed For Shortness of Breath 13)  Nitrostat 0.4 Mg Subl (Nitroglycerin) .Marland Kitchen.. 1 Tab Under Tongue As Needed For Chest Pain 14)  Spironolactone 50 Mg Tabs (Spironolactone) .Marland Kitchen.. 1 Tab By Mouth Daily For Heart 15)  Torsemide 20 Mg  Tabs (Torsemide) .... 2 Tab By Mouth Two Times A Day For Fluid  Allergies (verified): 1)  Ace Inhibitors 2)  * Actos  Past History:  Past Medical History: -CARDIOMYOPATHY, ISCHEMIC (ICD-414.8) (EF 30%) -CAD (ICD-414.00). Stent placement Right coronary artery 2007. Stenting LAD and cutting balloon of the diagonal (history of an inferior myocardial infarction.  Stent placement to the right coronary artery in May 2007.  He also has stenting to his LAD and a cutting balloon of the diagonal.  The most recent catheterization in June 2008 demonstrated distal obstructive LAD disease, but otherwise no acute disease.)  1. History of coronary artery disease with last catheterization in     January 2008 showing an ejection fraction of 35%, diagonal 40%,     distal left anterior  descending artery 95% (2-mm vessel),     circumflex 70% and no in-stent restenosis in the right coronary     artery, medical therapy was recommended. 2. Ischemic cardiomyopathy. 3. Ongoing tobacco abuse.      - PFT's June 17, 2010 >> no airflow obstruction 4. Hypertension. 5. Hyperlipidemia. 6. Hypothyroidism. 7. Obstructive sleep apnea.       - Sleep study 06/23/10>> AHI 54/hr with desat to 74% 8. History of deep venous thrombosis, on chronic Coumadin. 9. Allergy or intolerance to ACE INHIBITORS with cough and PIOGLITAZONE. 10.History of stent to the right coronary artery and staged stent to     the left anterior descending artery with percutaneous coronary     intervention of the first diagonal branch in May 2002 secondary to     non-ST segment elevation myocardial infarction. 11.Obesity. 12.History of spinal fusion. 13.Anxiety. 14.History of Fournier gangrene, status post surgical debridement x2     in 2006. 15.History of diabetic nephropathy. 16.Osteoarthritis. 17. Chronic respiratory failure       - CTangiogram Chest 05/21/2010 bilateral effusion, no PE       - No desat walking on 2lpm June 17, 2010  18.  Systolic CHF Left ventricle: Echo 08/2010:  The estimated EF  20% to 25%.   Physical Exam  General:  Well-developed,well-nourished,in moderate acute distress; alert,appropriate and cooperative throughout examination. Vitals reviewed.  Lungs:  Decreased breath sounds No exp wheezing, crackles Heart:  RRR, no murmurs  No jvd  Pulses:  +2 bilaterally  Extremities:  +1 edema    Impression & Recommendations:  Problem # 1:  CHRONIC SYSTOLIC HEART FAILURE (ICD-428.22) Hospital f/u for CHF exacerbation.  Unfortunately I do not have a d/c summary to review the hospital course and pt's d/c meds.  Pt did not bring in his d/c med list.  We've called the pharmacy to get a list of his new meds and they are in HPI.  Pt also came to ov without his oxygen, he has 2L O2  requirement.  After giving pt O2 here he settled down and was less dyspneic.  He has not started his medications that he was d/c with due to not having money to pay for them.  He will go cash his disability check today to pay for his med.  His weight is down from when he was admitted and he is not edematous on exam.  He was able to ambulate without his oxygen with Sats in the 94-95%.    Spoke with Nurse at [facility], Hilda Lias [phone].  His HHRN will be Thedore Mins.  His HHPT will be Tomi Bamberger.  I've given my concerns for pt's abililty to care for himself  at home.  He has been declining placement in STrehab or Assisted Living Facility, but today pt has agreed that he needs some help taking care of himself.  He stated that he did not know what he should do to get this help.  Stepahnie stated that [facility] will evaluate him and his living situation and then will ask for Social Work to see him to start paperwork process to get him into once of these facilities.  PT will evaluate him for fit of either facility.    The following medications were removed from the medication list:    Furosemide 40 Mg Tabs (Furosemide) .Marland Kitchen... Take 3 tabs in am, 2 tabs in pm His updated medication list for this problem includes:    Carvedilol 12.5 Mg Tabs (Carvedilol) .Marland Kitchen... Take one tablet twice daily    Digoxin 0.25 Mg Tabs (Digoxin) .Marland Kitchen... 1 tab by mouth daily    Bayer Aspirin Ec Low Dose 81 Mg Tbec (Aspirin) .Marland Kitchen... 1 tab by mouth daily    Diovan 320 Mg Tabs (Valsartan) .Marland Kitchen... 1 tab by mouth daily for heart    Warfarin Sodium 5 Mg Tabs (Warfarin sodium) .Marland Kitchen... Take as directed    Spironolactone 50 Mg Tabs (Spironolactone) .Marland Kitchen... 1 tab by mouth daily for heart  Complete Medication List: 1)  Lantus 100 Unit/ml Soln (Insulin glargine) .... Inject 75 units each evening  disp: qs 1 month 2)  Lipitor 80 Mg Tabs (Atorvastatin calcium) .Marland Kitchen.. 1 tab by mouth daily 3)  Carvedilol 12.5 Mg Tabs (Carvedilol) .... Take one  tablet twice daily 4)  Synthroid 100 Mcg Tabs (Levothyroxine sodium) .Marland Kitchen.. 1 tablet by mouth daily 5)  Norvasc 5 Mg Tabs (Amlodipine besylate) .Marland Kitchen.. 1 tab by mouth daily 6)  Digoxin 0.25 Mg Tabs (Digoxin) .Marland Kitchen.. 1 tab by mouth daily 7)  Imdur 60 Mg Xr24h-tab (Isosorbide mononitrate) .Marland Kitchen.. 1 tablet by mouth daily 8)  Bayer Aspirin Ec Low Dose 81 Mg Tbec (Aspirin) .Marland Kitchen.. 1 tab by mouth daily 9)  Diovan 320 Mg Tabs (Valsartan) .Marland Kitchen.. 1 tab by mouth daily for heart 10)  Warfarin Sodium 5 Mg Tabs (Warfarin sodium) .... Take as directed 11)  Oxygen  .... Wear 2l/min continuously 12)  Ventolin Hfa 108 (90 Base) Mcg/act Aers (Albuterol sulfate) .Marland Kitchen.. 1-2 puffs inhaled every 4 hours as needed for shortness of breath 13)  Nitrostat 0.4 Mg Subl (Nitroglycerin) .Marland Kitchen.. 1 tab under tongue as needed for chest pain 14)  Spironolactone 50 Mg Tabs (Spironolactone) .Marland Kitchen.. 1 tab by mouth daily for heart 15)  Torsemide 20 Mg Tabs (Torsemide) .... 2 tab by mouth two times a day for fluid  Other Orders: [facility]- Est Level  3 (16109)   Orders Added: 1)  [facility]- Est Level  3 [60454]

## 2010-10-27 NOTE — Assessment & Plan Note (Signed)
Summary: eph/mt   Visit Type:  Follow-up Primary Provider:  Cat Ta MD  CC:  Cardiomyopathy.  History of Present Illness: The patient presents after followup for management of heart failure. During that hospitalization he did have a slight troponin elevation that was felt to be supply demand mismatch. An echo suggested his EF was 25% however as usual when those were poor. We have only been able to judge his ejection fraction adequately by MUGA in the past. He was also treated for exacerbation of chronic lung disease. He did develop urinary retention and was sent home with an indwelling Foley and has followup with a neurologist soon. He says he's been trying very hard with his diet and keeping his feet up. He has been avoiding salt. He still does not have a scale but plans to buy one. A home health nurse will be coming. He is having no acute complaints but has his chronic dyspnea. This is better than when he presented. He sleeps in a chair. Is not having any new chest pressure, neck or arm discomfort. He is having no new palpitations, presyncope syncope. He has a cough nonproductive.  Current Medications (verified): 1)  Lantus 100 Unit/ml Soln (Insulin Glargine) .... Inject 75 Units Each Evening  Disp: Qs 1 Month 2)  Lipitor 80 Mg Tabs (Atorvastatin Calcium) .Marland Kitchen.. 1 Tab By Mouth Daily 3)  Carvedilol 12.5 Mg Tabs (Carvedilol) .... 1/2 By Mouth Two Times A Day 4)  Synthroid 100 Mcg Tabs (Levothyroxine Sodium) .Marland Kitchen.. 1 Tablet By Mouth Daily 5)  Norvasc 5 Mg Tabs (Amlodipine Besylate) .Marland Kitchen.. 1 Tab By Mouth Daily 6)  Digoxin 0.25 Mg Tabs (Digoxin) .Marland Kitchen.. 1 Tab By Mouth Daily 7)  Imdur 60 Mg Xr24h-Tab (Isosorbide Mononitrate) .Marland Kitchen.. 1 Tablet By Mouth Daily 8)  Bayer Aspirin Ec Low Dose 81 Mg Tbec (Aspirin) .Marland Kitchen.. 1 Tab By Mouth Daily 9)  Diovan 160 Mg Tabs (Valsartan) .Marland Kitchen.. 1 By Mouth Daily 10)  Warfarin Sodium 5 Mg Tabs (Warfarin Sodium) .... Take As Directed 11)  Oxygen .... Wear 2l/min Continuously 12)   Ventolin Hfa 108 (90 Base) Mcg/act Aers (Albuterol Sulfate) .Marland Kitchen.. 1-2 Puffs Inhaled Every 4 Hours As Needed For Shortness of Breath 13)  Nitrostat 0.4 Mg Subl (Nitroglycerin) .Marland Kitchen.. 1 Tab Under Tongue As Needed For Chest Pain 14)  Aldactone 25 Mg Tabs (Spironolactone) .... 1/2 By Mouth Daily 15)  Torsemide 20 Mg Tabs (Torsemide) .... 2 Tab By Mouth Two Times A Day For Fluid 16)  Acetaminophen 325 Mg  Tabs (Acetaminophen) .... As Needed 17)  Colace 100 Mg Caps (Docusate Sodium) .Marland Kitchen.. 1 By Mouth Daily 18)  Cardura 1 Mg Tabs (Doxazosin Mesylate) .Marland Kitchen.. 1 By Mouth Daily 19)  Lactulose 10 Gm/33ml Soln (Lactulose) .... As Needed 20)  Neomycin-Polymyxin-Gramicidin  Soln (Neomycin-Polymyx-Gramicid) .... Uad 21)  Miralax  Powd (Polyethylene Glycol 3350) .... As Needed 22)  Flomax 0.4 Mg Caps (Tamsulosin Hcl) .Marland Kitchen.. 1 By Mouth Daily 23)  Spiriva Handihaler 18 Mcg Caps (Tiotropium Bromide Monohydrate) .... Uad 24)  Vicodin 5-500 Mg Tabs (Hydrocodone-Acetaminophen) .... As Needed  Allergies (verified): 1)  Ace Inhibitors 2)  * Actos  Past History:  Past Medical History: -CARDIOMYOPATHY, ISCHEMIC (ICD-414.8) (EF 42%) -CAD (ICD-414.00). Stent placement Right coronary artery 2007. Stenting LAD and cutting balloon of the diagonal (history of an inferior myocardial infarction.  Stent placement to the right coronary artery in May 2007.  He also has stenting to his LAD and a cutting balloon of the diagonal.  The most  recent catheterization in June 2008 demonstrated distal obstructive LAD disease, but otherwise no acute disease.) 1. History of coronary artery disease with last catheterization in     January 2008 showing an ejection fraction of 35%, diagonal 40%,     distal left anterior descending artery 95% (2-mm vessel),     circumflex 70% and no in-stent restenosis in the right coronary     artery, medical therapy was recommended. 2. Ischemic cardiomyopathy. 3. Ongoing tobacco abuse.      - PFT's June 17, 2010 >> no airflow obstruction 4. Hypertension. 5. Hyperlipidemia. 6. Hypothyroidism. 7. Obstructive sleep apnea.       - Sleep study 06/23/10>> AHI 54/hr with desat to 74% 8. History of deep venous thrombosis, on chronic Coumadin. 9. Allergy or intolerance to ACE INHIBITORS with cough and PIOGLITAZONE. 10.History of stent to the right coronary artery and staged stent to     the left anterior descending artery with percutaneous coronary     intervention of the first diagonal branch in May 2002 secondary to     non-ST segment elevation myocardial infarction. 11.Obesity. 12.History of spinal fusion. 13.Anxiety. 14.History of Fournier gangrene, status post surgical debridement x2     in 2006. 15.History of diabetic nephropathy. 16.Osteoarthritis. 17. Chronic respiratory failure       - CTangiogram Chest 05/21/2010 bilateral effusion, no PE       - No desat walking on 2lpm June 17, 2010  18.  Systolic CHF Left ventricle: Echo 08/2010:  The estimated EF  20% to 25%.   Past Surgical History: Lumbar spinal fusion - about 1987 - 09/27/1985,  Tonsillectomy - as a child - 09/27/1954  Review of Systems       As stated in the HPI and negative for all other systems.   Vital Signs:  Patient profile:   60 year old male Height:      69 inches Weight:      226 pounds BMI:     33.50 Pulse rate:   75 / minute Resp:     16 per minute BP sitting:   102 / 62  (right arm)  Vitals Entered By: Marrion Coy, CNA (September 04, 2010 9:13 AM)  Physical Exam  General:  Chronically ill-appearing but in no distress Head:  normocephalic and atraumatic Eyes:  PERRLA/EOM intact; conjunctiva and lids normal. Mouth:  Teeth, gums and palate normal. Oral mucosa normal. Neck:  Neck supple, no JVD. No masses, thyromegaly or abnormal cervical nodes. Chest Wall:  no deformities Lungs:  Bilateral diminished breath sounds without crackles or rhonchi Abdomen:  Bowel sounds positive; abdomen soft and  non-tender without masses, organomegaly, or hernias noted. No hepatosplenomegaly, obese Msk:  Diffuse muscle wasting Extremities:  had mild bilateral lower extremity edema Neurologic:  weakness noted:Marland Kitchen   Cervical Nodes:  no significant adenopathy Inguinal Nodes:  no significant adenopathy Psych:  depressed affect.     Detailed Cardiovascular Exam  Neck    Carotids: Carotids full and equal bilaterally without bruits.      Neck Veins: Normal, no JVD.    Heart    Inspection: no deformities or lifts noted.      Palpation: normal PMI with no thrills palpable.      Auscultation: regular rate and rhythm, S1, S2 without murmurs, rubs, gallops, or clicks.    Vascular    Abdominal Aorta: no palpable masses, pulsations, or audible bruits.      Femoral Pulses: normal femoral pulses bilaterally.  Pedal Pulses: +2 bilaterally     Radial Pulses: normal radial pulses bilaterally.      Peripheral Circulation: no clubbing, cyanosis,  with normal capillary refill.     EKG  Procedure date:  09/04/2010  Findings:      Sinus rhythm, right axis deviation no acute ST-T wave changes  Impression & Recommendations:  Problem # 1:  CAD (ICD-414.00) He has had small vessel disease. Going to check a mild to see if his ejection fraction is much reduced. If so I could consider noninvasive or invasive evaluation to reexamine his coronaries. Orders: MUGA (MUGA) EKG w/ Interpretation (93000)  Problem # 2:  HYPERTENSION, BENIGN SYSTEMIC (ICD-401.1) His blood pressure is actually running low and when to stop the Norvasc. Eventually I would like to titrate his beta blocker upward again as it was reduced in the hospital. Orders: TLB-BNP (B-Natriuretic Peptide) (83880-BNPR) TLB-BMP (Basic Metabolic Panel-BMET) (80048-METABOL)  Problem # 3:  CARDIOMYOPATHY, ISCHEMIC (ICD-414.8) I will check a basic metabolic profile today and a BNP. We need to watch this closely as the primary team restarted Aldactone in  the hospital. He had previously had hyperkalemia with this.  Problem # 4:  COPD (ICD-496) Per his primary team. He has stopped smoking.  Patient Instructions: 1)  Your physician recommends that you schedule a follow-up appointment in: 1 month with Tereso Newcomer 2)  Your physician recommends that you have  lab work todau:  BMP and BNP 3)  Your physician has recommended you make the following change in your medication: Stop Norvasc 4)  Your physician has requested that you have a MUGA: A multigated acquisition (MUGA) scan is a test that looks at the chambers and blood vessels of the heart. Please see the CareNotes handout/brochure given to you today for further information.

## 2010-10-27 NOTE — Letter (Signed)
Summary: Generic Letter  [name]  [address]   [place], [place] [address]   Phone: [phone]  Fax: [phone]    11/21/2009  [name] [address]  [address] [place], [place]  [address]  Dear Mr. Mcqueen,   We have been unable to reach you by phone. Please call us when you get this letter so we can have you come in & see a doctor. It has been a long time & your thyroid levels need checking as do your liver levels, as you are taking a cholesterol lowering medicine. We can usually get you in the same day if you call early in the am. [phone] for me.     Sincerely,   Golden Circle RN

## 2010-10-27 NOTE — Assessment & Plan Note (Signed)
Summary: FU/KH   Vital Signs:  Patient profile:   61 year old male Height:      69 inches Weight:      225 pounds BMI:     33.35 Temp:     97.6 degrees F oral Pulse rate:   76 / minute BP sitting:   114 / 69  (left arm) Cuff size:   regular  Vitals Entered By: Tessie Fass CMA (Feb 10, 2010 9:06 AM) CC: F/U diabetes Is Patient Diabetic? Yes Pain Assessment Patient in pain? yes     Location: lower back Intensity: 4   Primary Care Provider:  Ardeen Garland  MD  CC:  F/U diabetes.  History of Present Illness: [name] comes in today for follow-up of diabetes.  He re-established care here in March and his diabetes was horribly out of control.  He has been coming monthly for check-ups and insulin adjustment.  He is currently on Lantus 30 units in the AM.  Forgot his log today but by his recollection, lowest AM fasting has been 168 and highest around 260.  Lowest eveing in upper 100s and highest has been <300.  Overall this is an improvement from 1 month ago.  No hypoglycemic events.  He admits he has noticed a correlation between his blood sugar and what he eats and is trying even harder to watch his diet. Still complains of poor balance, though states it is better.  Did not look for his home PT exercises and so has not been doing them.  Still resistant to formal PT due to transportation issues.    Habits & Providers  Alcohol-Tobacco-Diet     Tobacco Status: current     Tobacco Counseling: to quit use of tobacco products     Cigarette Packs/Day: 0.25  Allergies: 1)  Ace Inhibitors 2)  * Actos  Physical Exam  General:  Obese, NAD.  Alert, oritented, makes more sense today. vitals reviewed Lungs:  Normal respiratory effort, chest expands symmetrically. Lungs are clear to auscultation, no crackles or wheezes. Heart:  Normal rate and regular rhythm. S1 and S2 normal without gallop, murmur, click, rub or other extra sounds. Extremities:  no edema   Impression &  Recommendations:  Problem # 1:  DIABETES MELLITUS, TYPE II, UNCONTROLLED, WITH COMPLICATIONS (ICD-250.92) Assessment Improved  Overall sugars seem to be improving, though this is by recollection only today as he forgot his log.  will increasae lantus to 35 units.  Continue two times a day CBG checks.  Return in 1 month. Recheck A1C at that visit.  His updated medication list for this problem includes:    Lantus 100 Unit/ml Soln (Insulin glargine) ..... Inject 30 units once daily disp: qs 1 month    Bayer Aspirin Ec Low Dose 81 Mg Tbec (Aspirin) .Marland Kitchen... 1 tab by mouth daily    Diovan 160 Mg Tabs (Valsartan) .Marland Kitchen... Take one daily  Orders: [facility]- Est Level  3 (78295)  Problem # 2:  ABNORMALITY OF GAIT (ICD-781.2) Assessment: Improved  Reports some improvement.  STressed importance of strengthening exercises.  States he will look for his exercises and begin them.   Orders: [facility]- Est Level  3 (62130)  Complete Medication List: 1)  Lantus 100 Unit/ml Soln (Insulin glargine) .... Inject 30 units once daily disp: qs 1 month 2)  Ventolin Hfa 108 (90 Base) Mcg/act Aers (Albuterol sulfate) .Marland Kitchen.. 1-2 puffs inhaled every 4 hours as needed for shortness of breath 3)  Lipitor 80 Mg Tabs (Atorvastatin  calcium) .Marland Kitchen.. 1 tab by mouth daily 4)  Carvedilol 12.5 Mg Tabs (Carvedilol) .... Take one tablet twice daily 5)  Furosemide 40 Mg Tabs (Furosemide) .... Take 3 tabs in am, 2 tabs in pm 6)  Synthroid 100 Mcg Tabs (Levothyroxine sodium) .Marland Kitchen.. 1 tablet by mouth daily 7)  Nitrostat 0.4 Mg Subl (Nitroglycerin) .Marland Kitchen.. 1 tab under tongue as needed for chest pain 8)  Norvasc 5 Mg Tabs (Amlodipine besylate) .Marland Kitchen.. 1 tab by mouth daily 9)  Digoxin 0.25 Mg Tabs (Digoxin) .Marland Kitchen.. 1 tab by mouth daily 10)  Imdur 60 Mg Xr24h-tab (Isosorbide mononitrate) .Marland Kitchen.. 1 tablet by mouth daily 11)  Bayer Aspirin Ec Low Dose 81 Mg Tbec (Aspirin) .Marland Kitchen.. 1 tab by mouth daily 12)  Diovan 160 Mg Tabs (Valsartan) .... Take one daily 13)  Warfarin  Sodium 5 Mg Tabs (Warfarin sodium) .... Take as directed 14)  Potassium Chloride Crys Cr 20 Meq Cr-tabs (Potassium chloride crys cr) .... Take one tablet by mouth daily as directed

## 2010-10-27 NOTE — Medication Information (Signed)
Summary: rov/ewj  Anticoagulant Therapy  Managed by: Bethena Midget, RN, BSN PCP: Ardeen Garland  MD Supervising MD: Tenny Craw MD, Gunnar Fusi Indication 1: Deep Vein Thrombosis - Leg (ICD-451.1) Lab Used: [facility] Site: [facility] INR POC 2.4 INR RANGE 2 - 3  Dietary changes: no    Health status changes: no    Bleeding/hemorrhagic complications: no    Recent/future hospitalizations: no    Any changes in medication regimen? no    Recent/future dental: no  Any missed doses?: no       Is patient compliant with meds? yes       Allergies: 1)  Ace Inhibitors 2)  * Actos  Anticoagulation Management History:      The patient is taking warfarin and comes in today for a routine follow up visit.  Positive risk factors for bleeding include presence of serious comorbidities.  Negative risk factors for bleeding include an age less than 45 years old.  The bleeding index is 'intermediate risk'.  Positive CHADS2 values include History of CHF, History of HTN, and History of Diabetes.  Negative CHADS2 values include Age > 3 years old.  The start date was 12/25/2008.  His last INR was 1.99.  Anticoagulation responsible provider: Tenny Craw MD, Gunnar Fusi.  INR POC: 2.4.  Cuvette Lot#: [id number].  Exp: 01/2011.    Anticoagulation Management Assessment/Plan:      The patient's current anticoagulation dose is Warfarin sodium 5 mg tabs: take as directed.  The target INR is 2 - 3.  The next INR is due 01/12/2010.  Anticoagulation instructions were given to patient.  Results were reviewed/authorized by Bethena Midget, RN, BSN.  He was notified by Bethena Midget, RN, BSN.         Prior Anticoagulation Instructions: INR 1.6  Take an extra 1/2 tablet today then start taking 1 tablet daily except 1.5 tablets on Mondays, Wednesdays, Fridays, and Saturdays.  Recheck in 2-3 weeks.    Current Anticoagulation Instructions: INR 2.4 Continue 1.5 pills everyday except 1 pill on Tuesdays, Thursdays and Sundays. Recheck in 3 weeks.

## 2010-10-27 NOTE — Miscellaneous (Signed)
Summary: Orders Update pft charges  Clinical Lists Changes  Orders: Added new Service order of Carbon Monoxide diffusing w/capacity (94720) - Signed Added new Service order of Lung Volumes (94240) - Signed Added new Service order of Spirometry (Pre & Post) (94060) - Signed 

## 2010-10-27 NOTE — Letter (Signed)
Summary: Generic Letter  [name]  [address]   [place], [place] [address]   Phone: [phone]  Fax: [phone]    12/12/2009  [name] [address]  [address] [place], [place]  [address]  Dear Mr. Bermingham,      I was unable to contact you by phone.  Dr. Georgiana Shore requested we schedule an appointment for you with a podiatrist to exam your feet. An appointment has been scheduled  03/24/ 2011 at 10:15 AM with Dr. Charlsie Merles  of [facility] . The address is [address] [place] and phone number is [phone]. If this time is not convenient please call their office to reschedule.    Thank you.        Sincerely,   Theresia Lo RN

## 2010-10-27 NOTE — Assessment & Plan Note (Signed)
Summary: Pulmonary/ ext summary f/u with walking sats ok on 2lpm   Primary Provider/Referring Provider:  Cat Ta MD  CC:  3 wk followup with PFT's.  Pt states that his breathing is the same- no better or worse.  No new complaints today.Marland Kitchen  History of Present Illness: 60 yo  smoker hx of CAD, Ischemic cardiomyopathy  HTN, OSA, DVT on chronic coumadin.    May 28, 2010--Presents for post hospitalization. Admitted w/  progressive dyspnea. 05/20/2010-- 05/25/2010 for an acute exacerbation of O2-dependent chronic obstructive pulmonary disease. 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  Found to have bialteral  pleural effusions and volume overload.  He had some mild elevation in his cardiac enzymes, but this is felt secondary to CHF.  Pt had agressive diuresis.  Has a hx of DVT w/ INR 1.1 , CT chest was done which was neg for PE. CT showed  bilateral pleural effusions and mild edema as well as some pleural nodules and lymphadenopathy that were essentially unchanged from a CT of 2006. Pulmoanry consulted for ? aeCOPD flare and effusion.    He was started on azithromycin and while in the hospital,  Will need outpt sleep study > scheduled for Sept 27th.  June 17, 2010 3 wk followup with PFT's.  Pt states that his breathing is the same- no better or worse.  No new complaints today. able to shop on 02. leg swelling better but not resolved.  sleeping ok on 02 only does not have cpap.  Pt denies any significant sore throat, dysphagia, itching, sneezing,  nasal congestion or excess secretions,  fever, chills, sweats, unintended wt loss, pleuritic or exertional cp, hempoptysis, change in activity tolerance  orthopnea pnd.  Current Medications (verified): 1)  Lantus 100 Unit/ml Soln (Insulin Glargine) .... Inject 30 Units Once Daily Disp: Qs 1 Month 2)  Lipitor 80 Mg Tabs (Atorvastatin Calcium) .Marland Kitchen.. 1 Tab By Mouth Daily 3)  Carvedilol 12.5 Mg Tabs (Carvedilol) ....  Take One Tablet Twice Daily 4)  Furosemide 40 Mg Tabs (Furosemide) .... Take 3 Tabs in Am, 2 Tabs in Pm 5)  Synthroid 100 Mcg Tabs (Levothyroxine Sodium) .Marland Kitchen.. 1 Tablet By Mouth Daily 6)  Norvasc 5 Mg Tabs (Amlodipine Besylate) .Marland Kitchen.. 1 Tab By Mouth Daily 7)  Digoxin 0.25 Mg Tabs (Digoxin) .Marland Kitchen.. 1 Tab By Mouth Daily 8)  Imdur 60 Mg Xr24h-Tab (Isosorbide Mononitrate) .Marland Kitchen.. 1 Tablet By Mouth Daily 9)  Bayer Aspirin Ec Low Dose 81 Mg Tbec (Aspirin) .Marland Kitchen.. 1 Tab By Mouth Daily 10)  Diovan 160 Mg Tabs (Valsartan) .... Take One Daily 11)  Warfarin Sodium 5 Mg Tabs (Warfarin Sodium) .... Take As Directed 12)  Potassium Chloride Crys Cr 20 Meq Cr-Tabs (Potassium Chloride Crys Cr) .... Take One Tablet By Mouth Daily 13)  Oxygen .... Wear 2l/min Continuously 14)  Ventolin Hfa 108 (90 Base) Mcg/act Aers (Albuterol Sulfate) .Marland Kitchen.. 1-2 Puffs Inhaled Every 4 Hours As Needed For Shortness of Breath 15)  Nitrostat 0.4 Mg Subl (Nitroglycerin) .Marland Kitchen.. 1 Tab Under Tongue As Needed For Chest Pain 16)  Benzonatate 100 Mg Caps (Benzonatate) .... Take 1 Capsule By Mouth Three Times A Day As Needed  Allergies (verified): 1)  Ace Inhibitors 2)  * Actos  Past History:  Past Medical History:  CARDIOMYOPATHY, ISCHEMIC (ICD-414.8) (EF 30%) CAD (ICD-414.00). Stent placement Right coronary artery 2007. Stenting LAD and cutting balloon of the diagonal (history of an inferior myocardial infarction.  Stent placement to the  right coronary artery in May 2007.  He also has stenting to his LAD and a cutting balloon of the diagonal.  The most recent catheterization in June 2008 demonstrated distal obstructive LAD disease, but otherwise no acute disease.)  1. History of coronary artery disease with last catheterization in     January 2008 showing an ejection fraction of 35%, diagonal 40%,     distal left anterior descending artery 95% (2-mm vessel),     circumflex 70% and no in-stent restenosis in the right coronary     artery, medical  therapy was recommended. 2. Ischemic cardiomyopathy. 3. Ongoing tobacco abuse.      - PFT's June 17, 2010 >> no airflow obstruction 4. Hypertension. 5. Hyperlipidemia. 6. Hypothyroidism. 7. Obstructive sleep apnea.       - Sleep study 06/23/10>> 8. History of deep venous thrombosis, on chronic Coumadin. 9. Allergy or intolerance to ACE INHIBITORS with cough and     PIOGLITAZONE. 10.History of stent to the right coronary artery and staged stent to     the left anterior descending artery with percutaneous coronary     intervention of the first diagonal branch in May 2002 secondary to     non-ST segment elevation myocardial infarction. 11.Obesity. 12.History of spinal fusion. 13.Anxiety. 14.History of Fournier gangrene, status post surgical debridement x2     in 2006. 15.History of diabetic nephropathy. 16.Osteoarthritis. 17. Chronic respiratory failure       - CTangiogram Chest 05/21/2010 bilateral effusion, no PE       - No desat walking on 2lpm June 17, 2010   Vital Signs:  Patient profile:   60 year old male Weight:      236 pounds O2 Sat:      94 % on Room air Temp:     97.6 degrees F oral Pulse rate:   69 / minute BP sitting:   116 / 70  (left arm)  Vitals Entered By: Vernie Murders (June 17, 2010 11:03 AM)  O2 Flow:  Room air  Serial Vital Signs/Assessments:  Comments: 11:43 AM Ambulatory Pulse Oximetry  Resting; HR__69___    02 Sat__96%2lpm___  Lap1 (185 feet)   HR__79___   02 Sat__97%2lpm___ Lap2 (185 feet)   HR_____   02 Sat_____    Lap3 (185 feet)   HR_____   02 Sat_____  ___Test Completed without Difficulty _x__Test Stopped due to:pt c/o SOB   By: Vernie Murders    Physical Exam  Additional Exam:  GEN: A/Ox3; obese male walks w/ cane    wt 237 May 28, 2010>> 236 June 17, 2010  HEENT:  [place]/AT, , EACs-clear, TMs-wnl, NOSE-clear, THROAT-clear NECK:  Supple w/ fair ROM; no JVD; normal carotid impulses w/o bruits; no  thyromegaly or nodules palpated; no lymphadenopathy. RESP  Clear to P & A; w/o, wheezes/ rales/ or rhonchi. CARD:  RRR, no m/r/g   GI:   Soft & nt; nml bowel sounds; no organomegaly or masses detected. Musco: Warm bil,  no calf tenderness  clubbing, pulses intact, tr edema  Neuro:  intact w/ no focal deficits noted.    Impression & Recommendations:  Problem # 1:  RESPIRATORY FAILURE, CHRONIC (ICD-518.83)  Based on PFT's and CT chest this is entirely related to obesity and chf/bilateral effusions so no f/u needed here unless for problem #2  Orders: Est. Patient Level IV (99214) Pulse Oximetry, Ambulatory (16109)  Problem # 2:  APNEA, SLEEP (ICD-780.57)  doing ok on 2lpm without cpap but need sleep  study on 02 and referral to sleep doc as needed   Orders: Est. Patient Level IV (99833)  Problem # 3:  CHRONIC SYSTOLIC HEART FAILURE (ICD-428.22)  His updated medication list for this problem includes:    Carvedilol 12.5 Mg Tabs (Carvedilol) .Marland Kitchen... Take one tablet twice daily    Furosemide 40 Mg Tabs (Furosemide) .Marland Kitchen... Take 3 tabs in am, 2 tabs in pm    Digoxin 0.25 Mg Tabs (Digoxin) .Marland Kitchen... 1 tab by mouth daily    Bayer Aspirin Ec Low Dose 81 Mg Tbec (Aspirin) .Marland Kitchen... 1 tab by mouth daily    Diovan 160 Mg Tabs (Valsartan) .Marland Kitchen... Take one daily    Warfarin Sodium 5 Mg Tabs (Warfarin sodium) .Marland Kitchen... Take as directed   Still edematous, rx per Dr Antoine Poche   - see instructions re use of coreg here.  Orders: Est. Patient Level IV (82505)  Patient Instructions: 1)  You do not have any significant copd 2)  You may be prone to asthma and if you are noting more wheezing or short of breath requiring more ventolin for relief when you do now you need to see Dr Antoine Poche for a substitute for coreg before we would be willing to change your asthma regimen 3)  We will set you up for an appt to see one of our sleep doctors after we have a chance to review you sleep study 4)  Wear your 02 at bedtime and  with any activity besides sitting still

## 2010-10-27 NOTE — Medication Information (Signed)
Summary: rov/ewj  Anticoagulant Therapy  Managed by: Cloyde Reams, RN, BSN PCP: Sylvan Cheese MD Supervising MD: Tenny Craw MD, Gunnar Fusi Indication 1: Deep Vein Thrombosis - Leg (ICD-451.1) Lab Used: [facility] Site: [facility] INR POC 1.6 INR RANGE 2 - 3  Dietary changes: no    Health status changes: no    Bleeding/hemorrhagic complications: yes       Details: 1 episode of BRB in stool.  Pt will continue to monitor and contact primary MD know.    Recent/future hospitalizations: no    Any changes in medication regimen? no    Recent/future dental: no  Any missed doses?: no       Is patient compliant with meds? yes       Allergies: 1)  Ace Inhibitors 2)  * Actos  Anticoagulation Management History:      The patient is taking warfarin and comes in today for a routine follow up visit.  Positive risk factors for bleeding include presence of serious comorbidities.  Negative risk factors for bleeding include an age less than 56 years old.  The bleeding index is 'intermediate risk'.  Positive CHADS2 values include History of CHF, History of HTN, and History of Diabetes.  Negative CHADS2 values include Age > 83 years old.  The start date was 12/25/2008.  His last INR was 1.99.  Anticoagulation responsible provider: Tenny Craw MD, Gunnar Fusi.  INR POC: 1.6.  Cuvette Lot#: [id number].  Exp: 01/2011.    Anticoagulation Management Assessment/Plan:      The patient's current anticoagulation dose is Warfarin sodium 5 mg tabs: take as directed.  The target INR is 2 - 3.  The next INR is due 12/22/2009.  Anticoagulation instructions were given to patient.  Results were reviewed/authorized by Cloyde Reams, RN, BSN.  He was notified by Cloyde Reams RN.         Prior Anticoagulation Instructions: INR 1.8  Start taking 1 tablet daily except 1.5 tablets on Mondays, Wednesdays, and Fridays.  Recheck in 2-3 weeks.    Current Anticoagulation Instructions: INR 1.6  Take an extra 1/2 tablet today then start taking  1 tablet daily except 1.5 tablets on Mondays, Wednesdays, Fridays, and Saturdays.  Recheck in 2-3 weeks.

## 2010-10-27 NOTE — Medication Information (Signed)
Summary: rov/sp  Anticoagulant Therapy  Managed by: Cloyde Reams, RN, BSN PCP: [name] MD Supervising MD: Jens Som MD, Arlys John Indication 1: Deep Vein Thrombosis - Leg (ICD-451.1) Lab Used: [facility] Site: [facility] INR POC 1.1 INR RANGE 2 - 3  Dietary changes: yes       Details: Incr vit K in diet this week.    Health status changes: no    Bleeding/hemorrhagic complications: no    Recent/future hospitalizations: no    Any changes in medication regimen? yes       Details: Was on abx x 10 days, completed abx.  Recent/future dental: no  Any missed doses?: no       Is patient compliant with meds? yes       Allergies: 1)  Ace Inhibitors 2)  * Actos  Anticoagulation Management History:      The patient is taking warfarin and comes in today for a routine follow up visit.  Positive risk factors for bleeding include presence of serious comorbidities.  Negative risk factors for bleeding include an age less than 69 years old.  The bleeding index is 'intermediate risk'.  Positive CHADS2 values include History of CHF, History of HTN, and History of Diabetes.  Negative CHADS2 values include Age > 4 years old.  The start date was 12/25/2008.  His last INR was 1.99.  Anticoagulation responsible provider: Jens Som MD, Arlys John.  INR POC: 1.1.  Cuvette Lot#: [id number].  Exp: 06/2011.    Anticoagulation Management Assessment/Plan:      The patient's current anticoagulation dose is Warfarin sodium 5 mg tabs: take as directed.  The target INR is 2 - 3.  The next INR is due 05/08/2010.  Anticoagulation instructions were given to patient.  Results were reviewed/authorized by Cloyde Reams, RN, BSN.  He was notified by Cloyde Reams RN.         Prior Anticoagulation Instructions: INR 1.6  Take 2 tablets today then increase dose to 1 1/2 tablets every day except 1 tablet on Sunday.  Recheck in 10 days.   Current Anticoagulation Instructions: INR 1.1  Take 2 tablets today and tomorrow, then  start taking 1.5 tablets daily.  Recheck in 1-2 weeks.

## 2010-10-27 NOTE — Medication Information (Signed)
Summary: rov/mlw  Anticoagulant Therapy  Managed by: Shelby Dubin, PharmD PCP: Sylvan Cheese MD Supervising MD: Myrtis Ser MD, Tinnie Gens Indication 1: Deep Vein Thrombosis - Leg (ICD-451.1) Lab Used: [facility] Site: [facility] INR POC 1.2 INR RANGE 2 - 3  Dietary changes: no    Health status changes: no    Bleeding/hemorrhagic complications: no    Recent/future hospitalizations: no    Any changes in medication regimen? no    Recent/future dental: no  Any missed doses?: no       Is patient compliant with meds? yes       Allergies: 1)  Ace Inhibitors 2)  * Actos  Anticoagulation Management History:      The patient is taking warfarin and comes in today for a routine follow up visit.  Positive risk factors for bleeding include presence of serious comorbidities.  Negative risk factors for bleeding include an age less than 60 years old.  The bleeding index is 'intermediate risk'.  Positive CHADS2 values include History of CHF, History of HTN, and History of Diabetes.  Negative CHADS2 values include Age > 88 years old.  The start date was 12/25/2008.  His last INR was 1.99.  Anticoagulation responsible provider: Myrtis Ser MD, Tinnie Gens.  INR POC: 1.2.  Cuvette Lot#: [id number].  Exp: 12/2010.    Anticoagulation Management Assessment/Plan:      The patient's current anticoagulation dose is Warfarin sodium 5 mg tabs: take as directed.  The target INR is 2 - 3.  The next INR is due 10/28/2009.  Anticoagulation instructions were given to patient.  Results were reviewed/authorized by Shelby Dubin, PharmD.  He was notified by Lew Dawes, PharmD Candidate.         Prior Anticoagulation Instructions: INR 2.7   Continue 1 tab (5 mg) daily except 1.5 tab (7.5 mg) on Mondays and Fridays.   Recheck in 3 weeks as requested. January 18th at 9:45 am.   Current Anticoagulation Instructions: INR 1.2  Take an extra 0.5 tablet today then take 1.5 tablets Wednesday and Thursday. Resume current dose of 1 tablet  daily except 1.5 tablets on Mondays and Fridays. Patient refused recheck in 1 week. Will see in 2 weeks.

## 2010-10-27 NOTE — Medication Information (Signed)
Summary: cvrr  Anticoagulant Therapy  Managed by: Bethena Midget, RN, BSN PCP: Ardeen Garland  MD Supervising MD: Juanda Chance MD, Doni Widmer Indication 1: Deep Vein Thrombosis - Leg (ICD-451.1) Lab Used: [facility] Site: [facility] INR POC 1.1 INR RANGE 2 - 3  Dietary changes: no    Health status changes: no    Bleeding/hemorrhagic complications: no    Recent/future hospitalizations: no    Any changes in medication regimen? no    Recent/future dental: no  Any missed doses?: yes     Details: Missed dose last week  Is patient compliant with meds? yes       Allergies: 1)  Ace Inhibitors 2)  * Actos  Anticoagulation Management History:      The patient is taking warfarin and comes in today for a routine follow up visit.  Positive risk factors for bleeding include presence of serious comorbidities.  Negative risk factors for bleeding include an age less than 87 years old.  The bleeding index is 'intermediate risk'.  Positive CHADS2 values include History of CHF, History of HTN, and History of Diabetes.  Negative CHADS2 values include Age > 52 years old.  The start date was 12/25/2008.  His last INR was 1.99.  Anticoagulation responsible provider: Juanda Chance MD, Smitty Cords.  INR POC: 1.1.  Cuvette Lot#: [id number].  Exp: 01/2011.    Anticoagulation Management Assessment/Plan:      The patient's current anticoagulation dose is Warfarin sodium 5 mg tabs: take as directed.  The target INR is 2 - 3.  The next INR is due 03/20/2010.  Anticoagulation instructions were given to patient.  Results were reviewed/authorized by Bethena Midget, RN, BSN.  He was notified by Bethena Midget, RN, BSN.         Prior Anticoagulation Instructions: INR 1.3  Take extra 1/2 tablet taday then take 1.5 tablets tomorrow, then resume same dosage 1.5 tablets on Mondays, Wednesdays, Fridays, and Saturdays, and 1 tablet all other days.    Current Anticoagulation Instructions: INR 1.1 Today take extra 1/2 pill and on Saturday take 2  pills then change dose to 1 1/2 pills everyday except 1 pill on Sundays and Thursdays. Recheck in one week.

## 2010-10-27 NOTE — Medication Information (Signed)
Summary: rov/eh  Anticoagulant Therapy  Managed by: Bethena Midget, RN, BSN PCP: Sylvan Cheese MD Supervising MD: Graciela Husbands MD, Viviann Spare Indication 1: Deep Vein Thrombosis - Leg (ICD-451.1) Lab Used: [facility] Site: [facility] INR POC 1.6 INR RANGE 2 - 3  Dietary changes: no    Health status changes: no    Bleeding/hemorrhagic complications: no    Recent/future hospitalizations: no    Any changes in medication regimen? no    Recent/future dental: no  Any missed doses?: no       Is patient compliant with meds? yes       Allergies: 1)  Ace Inhibitors 2)  * Actos  Anticoagulation Management History:      The patient is taking warfarin and comes in today for a routine follow up visit.  Positive risk factors for bleeding include presence of serious comorbidities.  Negative risk factors for bleeding include an age less than 14 years old.  The bleeding index is 'intermediate risk'.  Positive CHADS2 values include History of CHF, History of HTN, and History of Diabetes.  Negative CHADS2 values include Age > 36 years old.  The start date was 12/25/2008.  His last INR was 1.99.  Anticoagulation responsible provider: Graciela Husbands MD, Viviann Spare.  INR POC: 1.6.  Cuvette Lot#: [id number].  Exp: 12/2010.    Anticoagulation Management Assessment/Plan:      The patient's current anticoagulation dose is Warfarin sodium 5 mg tabs: take as directed.  The target INR is 2 - 3.  The next INR is due 11/11/2009.  Anticoagulation instructions were given to patient.  Results were reviewed/authorized by Bethena Midget, RN, BSN.  He was notified by Bethena Midget, RN, BSN.         Prior Anticoagulation Instructions: INR 1.2  Take an extra 0.5 tablet today then take 1.5 tablets Wednesday and Thursday. Resume current dose of 1 tablet daily except 1.5 tablets on Mondays and Fridays. Patient refused recheck in 1 week. Will see in 2 weeks.  Current Anticoagulation Instructions: INR 1.6 Today take 1/2 pill. Then change dose to  1 pill everyday except 1 1/2 pills on Mondays, Wednesdays and Fridays. Recheck in 2 weeks.

## 2010-10-27 NOTE — Miscellaneous (Signed)
Summary: Changing Prob List   Clinical Lists Changes  Problems: Removed problem of TOBACCO USER (ICD-305.1) Removed problem of ONYCHOMYCOSIS, TOENAILS (ICD-110.1) Removed problem of ABNORMALITY OF GAIT (ICD-781.2) Removed problem of HYPERKALEMIA (ICD-276.7) Removed problem of CATARACT, HX OF (ICD-V12.40) Removed problem of SPECIAL SCREENING FOR MALIGNANT NEOPLASMS COLON (ICD-V76.51) Removed problem of FINGER, BLISTER, INFECTED (ICD-915.3) Removed problem of CHEST PAIN UNSPECIFIED (ICD-786.50) Removed problem of APNEA, SLEEP (ICD-780.57) Removed problem of OBESITY, NOS (ICD-278.00) Removed problem of ANXIETY (ICD-300.00) Removed problem of RESPIRATORY FAILURE, CHRONIC (ZOX-096.04) Observations: Added new observation of PAST MED HX: -CARDIOMYOPATHY, ISCHEMIC (ICD-414.8) (EF 30%) -CAD (ICD-414.00). Stent placement Right coronary artery 2007. Stenting LAD and cutting balloon of the diagonal (history of an inferior myocardial infarction.  Stent placement to the right coronary artery in May 2007.  He also has stenting to his LAD and a cutting balloon of the diagonal.  The most recent catheterization in June 2008 demonstrated distal obstructive LAD disease, but otherwise no acute disease.)  1. History of coronary artery disease with last catheterization in     January 2008 showing an ejection fraction of 35%, diagonal 40%,     distal left anterior descending artery 95% (2-mm vessel),     circumflex 70% and no in-stent restenosis in the right coronary     artery, medical therapy was recommended. 2. Ischemic cardiomyopathy. 3. Ongoing tobacco abuse.      - PFT's June 17, 2010 >> no airflow obstruction 4. Hypertension. 5. Hyperlipidemia. 6. Hypothyroidism. 7. Obstructive sleep apnea.       - Sleep study 06/23/10>> 8. History of deep venous thrombosis, on chronic Coumadin. 9. Allergy or intolerance to ACE INHIBITORS with cough and PIOGLITAZONE. 10.History of stent to the right coronary  artery and staged stent to     the left anterior descending artery with percutaneous coronary     intervention of the first diagonal branch in May 2002 secondary to     non-ST segment elevation myocardial infarction. 11.Obesity. 12.History of spinal fusion. 13.Anxiety. 14.History of Fournier gangrene, status post surgical debridement x2     in 2006. 15.History of diabetic nephropathy. 16.Osteoarthritis. 17. Chronic respiratory failure       - CTangiogram Chest 05/21/2010 bilateral effusion, no PE       - No desat walking on 2lpm June 17, 2010   (08/06/2010 15:19) Added new observation of PRIMARY MD: Cat Ta MD (08/06/2010 15:19)       Past History:  Past Medical History: -CARDIOMYOPATHY, ISCHEMIC (ICD-414.8) (EF 30%) -CAD (ICD-414.00). Stent placement Right coronary artery 2007. Stenting LAD and cutting balloon of the diagonal (history of an inferior myocardial infarction.  Stent placement to the right coronary artery in May 2007.  He also has stenting to his LAD and a cutting balloon of the diagonal.  The most recent catheterization in June 2008 demonstrated distal obstructive LAD disease, but otherwise no acute disease.)  1. History of coronary artery disease with last catheterization in     January 2008 showing an ejection fraction of 35%, diagonal 40%,     distal left anterior descending artery 95% (2-mm vessel),     circumflex 70% and no in-stent restenosis in the right coronary     artery, medical therapy was recommended. 2. Ischemic cardiomyopathy. 3. Ongoing tobacco abuse.      - PFT's June 17, 2010 >> no airflow obstruction 4. Hypertension. 5. Hyperlipidemia. 6. Hypothyroidism. 7. Obstructive sleep apnea.       - Sleep study 06/23/10>> 8. History of  deep venous thrombosis, on chronic Coumadin. 9. Allergy or intolerance to ACE INHIBITORS with cough and PIOGLITAZONE. 10.History of stent to the right coronary artery and staged stent to     the left anterior  descending artery with percutaneous coronary     intervention of the first diagonal branch in May 2002 secondary to     non-ST segment elevation myocardial infarction. 11.Obesity. 12.History of spinal fusion. 13.Anxiety. 14.History of Fournier gangrene, status post surgical debridement x2     in 2006. 15.History of diabetic nephropathy. 16.Osteoarthritis. 17. Chronic respiratory failure       - CTangiogram Chest 05/21/2010 bilateral effusion, no PE       - No desat walking on 2lpm June 17, 2010

## 2010-10-28 ENCOUNTER — Telehealth: Payer: Self-pay | Admitting: Family Medicine

## 2010-10-29 NOTE — Progress Notes (Signed)
Summary: pt had weight increase/swollen legs  Phone Note From Other Clinic   [facility] [phone] fyi Summary of Call: wt increase 248.8 today,248.2 on friday,when came home from hospital in dec was 132- legs are moderately swollen- pt [phone] Initial call taken by: Glynda Jaeger,  October 05, 2010 10:26 AM  Follow-up for Phone Call        Patient will be scheduled to see the PA. Follow-up by: [name], MD, [facility],  October 05, 2010 1:01 PM

## 2010-10-29 NOTE — Assessment & Plan Note (Signed)
Summary: hospital f/u CHF, DM    Vital Signs:  Patient profile:   61 year old male Height:      69 inches Weight:      240 pounds BMI:     35.57 Temp:     97.4 degrees F oral Pulse rate:   84 / minute BP sitting:   114 / 68  (left arm) Cuff size:   regular  Vitals Entered By: Tessie Fass CMA (October 21, 2010 3:06 PM) CC: hospital f/u chf Is Patient Diabetic? Yes Pain Assessment Patient in pain? no        Primary Care Provider:  Ilija Maxim MD  CC:  hospital f/u chf.  History of Present Illness: 60 y/o M with multiple medical conditions including CAD, HLD, HTN, DM, DVT, ischemic CM, systolic CHF with EF 20-25% who presents for hospital f/u of CHF exacerbation.    CHF: He has been checking his weight daily.  His weight has ranged from (10/12/10) 248# to 236 # on 10/21/10.  He states that he feel "good", not 100% yet.  He is wearing compression stockings daily.  States that he has some LE edema.  He is working with PT for deconditioning.  Has stopped using salt with his food.   Chest pain: no  Dyspnea:no  Palpitations: no   Syncope: no  DIABETES Meds:  Lantus 35 units qpm Compliance: yes  Diet: eating more vegetable, no yolk noodles, stir fly, cheerios, occasional Dunkin stick for breakfast.   Lightheadedness:no    Dizziness:no    Confusion :no   Shakiness:no   Abd  pain:no   Nausea:no    Vomiting:no  CBGs since 10/12/2009: 170s-300s.  One low number was 129.  One high number of 400.  ARF: ARF during previous admission thought to be from prenal and postobstructive causes.  Pt has urinary retention and has indwelling catheter.  Baseline Cr 1.1 to 1.2 but was as high as 2.6 during hospitalization.  Most recent Cr 1.5.    DVT: History of RLE DVT and is on chronic coumadin.  He has not taken coumadin in 1 week and only resumed this morning.  Pt has poor insight into his medical conditions and do not really know his medications.     ***  Planned urology procedure with Dr Phoebe Perch on  11/06/10.  Dr Antoine Poche gave clearance for stopping coumadin for 5 days.  Current Medications (verified): 1)  Lantus 100 Unit/ml Soln (Insulin Glargine) .... Inject 35 Units Each Evening  Disp: Qs 1 Month 2)  Lipitor 80 Mg Tabs (Atorvastatin Calcium) .Marland Kitchen.. 1 Tab By Mouth Daily 3)  Carvedilol 12.5 Mg Tabs (Carvedilol) .Marland Kitchen.. 1tab By Mouth Two Times A Day 4)  Synthroid 100 Mcg Tabs (Levothyroxine Sodium) .Marland Kitchen.. 1 Tablet By Mouth Daily 5)  Digoxin 0.25 Mg Tabs (Digoxin) .Marland Kitchen.. 1 Tab By Mouth Daily 6)  Imdur 60 Mg Xr24h-Tab (Isosorbide Mononitrate) .Marland Kitchen.. 1 Tablet By Mouth Daily 7)  Bayer Aspirin Ec Low Dose 81 Mg Tbec (Aspirin) .Marland Kitchen.. 1 Tab By Mouth Daily 8)  Diovan 160 Mg Tabs (Valsartan) .Marland Kitchen.. 1 By Mouth Daily 9)  Warfarin Sodium 5 Mg Tabs (Warfarin Sodium) .... Take As Directed 10)  Oxygen .... Wear 2l/min Continuously 11)  Ventolin Hfa 108 (90 Base) Mcg/act Aers (Albuterol Sulfate) .Marland Kitchen.. 1-2 Puffs Inhaled Every 4 Hours As Needed For Shortness of Breath 12)  Nitrostat 0.4 Mg Subl (Nitroglycerin) .Marland Kitchen.. 1 Tab Under Tongue As Needed For Chest Pain 13)  Torsemide 20 Mg Tabs (  Torsemide) .... 2 Tab By Mouth Once A Day For Fluid 14)  Acetaminophen 325 Mg  Tabs (Acetaminophen) .... As Needed 15)  Colace 100 Mg Caps (Docusate Sodium) .Marland Kitchen.. 1 By Mouth Daily 16)  Cardura 1 Mg Tabs (Doxazosin Mesylate) .... 1/2 Tab By Mouth Daily 17)  Miralax  Powd (Polyethylene Glycol 3350) .... As Needed 18)  Flomax 0.4 Mg Caps (Tamsulosin Hcl) .Marland Kitchen.. 1 By Mouth Daily 19)  Doxazosin Mesylate 2 Mg Tabs (Doxazosin Mesylate) .... 1/2 Tab By Mouth Daily  Allergies (verified): 1)  Ace Inhibitors 2)  * Actos  Past History:  Past Medical History: Last updated: 09/17/2010 -CARDIOMYOPATHY, ISCHEMIC (ICD-414.8) (EF 42%) -CAD (ICD-414.00). Stent placement Right coronary artery 2007. Stenting LAD and cutting balloon of the diagonal (history of an inferior myocardial infarction.  Stent placement to the right coronary artery in May 2007.  He  also has stenting to his LAD and a cutting balloon of the diagonal.  The most recent catheterization in June 2008 demonstrated distal obstructive LAD disease, but otherwise no acute disease.) 1. History of coronary artery disease with last catheterization in     January 2008 showing an ejection fraction of 35%, diagonal 40%,     distal left anterior descending artery 95% (2-mm vessel),     circumflex 70% and no in-stent restenosis in the right coronary     artery, medical therapy was recommended. 2. Ischemic cardiomyopathy. 3. Ongoing tobacco abuse.      - PFT's June 17, 2010 >> no airflow obstruction 4. Hypertension. 5. Hyperlipidemia. 6. Hypothyroidism. 7. Obstructive sleep apnea.       - Sleep study 06/23/10>> AHI 54/hr with desat to 74% 8. History of deep venous thrombosis, on chronic Coumadin. 9. Allergy or intolerance to ACE INHIBITORS with cough and PIOGLITAZONE. 10.History of stent to the right coronary artery and staged stent to     the left anterior descending artery with percutaneous coronary     intervention of the first diagonal branch in May 2002 secondary to     non-ST segment elevation myocardial infarction. 11.Obesity. 12.History of spinal fusion. 13.Anxiety. 14.History of Fournier gangrene, status post surgical debridement x2     in 2006. 15.History of diabetic nephropathy. 16.Osteoarthritis. 17. Chronic respiratory failure       - CTangiogram Chest 05/21/2010 bilateral effusion, no PE       - No desat walking on 2lpm June 17, 2010  18.  Systolic CHF Left ventricle: Echo 08/2010:  The estimated EF  20% to 25%.  19. history of PE, on coumadin  Past Surgical History: Last updated: 09/04/2010 Lumbar spinal fusion - about 1987 - 09/27/1985,  Tonsillectomy - as a child - 09/27/1954  Family History: Last updated: 01/21/2009  Mother; he does not know her health problems.  Father   is unknown to the patient.  A sister had breast cancer.      Social  History: Last updated: 10/21/2010 The patient is divorced.   He lives alone. 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   He has not spoken to his on ly daughter since age 13 His sister is his preferred advocate FULL CODE. Drinks alcohol current smoker, began age 44, 1 pack per week.   Neighbor: Brandon Melnick [phone]  Has HH RN and HHPT  Risk Factors: Smoking Status: current (08/26/2010) Packs/Day: 0.25 (08/26/2010)  Social History: The patient is divorced.   He lives alone. 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   He has not spoken to his  on ly daughter since age 49 His sister is his preferred advocate FULL CODE. Drinks alcohol current smoker, began age 89, 1 pack per week.   Neighbor: Brandon Melnick [phone]  Has HH RN and HHPT  Review of Systems       per hpi   Physical Exam  General:  Well-developed,well-nourished,in no acute distress; alert,appropriate and cooperative throughout examination. Vitals reviewed.  Lungs:  Normal respiratory effort, chest expands symmetrically. Lungs are clear to auscultation, no crackles or wheezes. Heart:  Normal rate and regular rhythm. S1 and S2 normal without gallop, murmur, click, rub or other extra sounds.  No bruit.  No jvd.   Abdomen:  Bowel sounds positive,abdomen soft and non-tender without masses, organomegaly or hernias noted.  obese.  No fluid shift.  Pulses:  R posterior tibial normal and L posterior tibial normal.   Extremities:  trace left pedal edema and trace right pedal edema.   Neurologic:  alert & oriented X3.     Impression & Recommendations:  Problem # 1:  CHRONIC SYSTOLIC HEART FAILURE (ICD-428.22) Assessment Improved Pt appears euvolemic today, although weight is still about 10 lbs above baseline.  Will continue torsemide 40mg  daily and check digoxin level today.  Discussed that pt should weigh himself daily and to call me immediately if his weight is 248 lbs or above.    His updated medication list for this problem includes:     Carvedilol 12.5 Mg Tabs (Carvedilol) .Marland Kitchen... 1tab by mouth two times a day    Digoxin 0.25 Mg Tabs (Digoxin) .Marland Kitchen... 1 tab by mouth daily    Bayer Aspirin Ec Low Dose 81 Mg Tbec (Aspirin) .Marland Kitchen... 1 tab by mouth daily    Diovan 160 Mg Tabs (Valsartan) .Marland Kitchen... 1 by mouth daily    Warfarin Sodium 5 Mg Tabs (Warfarin sodium) .Marland Kitchen... Take as directed    Torsemide 20 Mg Tabs (Torsemide) .Marland Kitchen... 2 tab by mouth once a day for fluid  Orders: Digoxin-[facility] (11914-78295) [facility]- Est  Level 4 (62130)  Problem # 2:  DIABETES MELLITUS, TYPE II, UNCONTROLLED, WITH COMPLICATIONS (ICD-250.92) Assessment: Improved Pt has been noncompliant in the past and difficult to treat because of infequent visits to [facility].  Last A1C >14 and today A1C is 10.1, which is improved but still not at goal.  A1C of 10.1 is the best A1C pt has had since 2008.  Pt had one low CBG of 129.  I would like to increase Lantus from 135 units to 140 units daily.  Will f/u in 1 month.   His updated medication list for this problem includes:    Lantus 100 Unit/ml Soln (Insulin glargine) ..... Inject 35 units each evening  disp: qs 1 month    Bayer Aspirin Ec Low Dose 81 Mg Tbec (Aspirin) .Marland Kitchen... 1 tab by mouth daily    Diovan 160 Mg Tabs (Valsartan) .Marland Kitchen... 1 by mouth daily  Orders: A1C-[facility] (86578) [facility]- Est  Level 4 (46962)  Problem # 3:  RENAL FAILURE, ACUTE (ICD-584.9) Assessment: New Pt had ARF from prenal and postobstructive etiologies.  Pt was d/c home with increased dose of torsemide 40mg  daily.  Will check Bmet today for survellaince.  Base Cr 1.1 - 1.2.  Most recent Bmet showed Cr 2.58.    Orders: Basic Met-[facility] ([id number]) [facility]- Est  Level 4 (01027)  Problem # 4:  DEEP VENOUS THROMBOPHLEBITIS, LEG, RIGHT (ICD-453.40) Assessment: Deteriorated Pt has been out of coumadin for almost 1 week and only resumed taking it this morning.  INR today is subtheratpeutic at 1.1.  Pt has difficulty getting transportation to our clinic and has been therapeutic  in the past with coumadin 5 mg daily.  Will resume this schedule.   Orders: INR/PT-[facility] ([address]) [facility]- Est  Level 4 (60454)  Complete Medication List: 1)  Lantus 100 Unit/ml Soln (Insulin glargine) .... Inject 35 units each evening  disp: qs 1 month 2)  Lipitor 80 Mg Tabs (Atorvastatin calcium) .Marland Kitchen.. 1 tab by mouth daily 3)  Carvedilol 12.5 Mg Tabs (Carvedilol) .Marland Kitchen.. 1tab by mouth two times a day 4)  Synthroid 100 Mcg Tabs (Levothyroxine sodium) .Marland Kitchen.. 1 tablet by mouth daily 5)  Digoxin 0.25 Mg Tabs (Digoxin) .Marland Kitchen.. 1 tab by mouth daily 6)  Imdur 60 Mg Xr24h-tab (Isosorbide mononitrate) .Marland Kitchen.. 1 tablet by mouth daily 7)  Bayer Aspirin Ec Low Dose 81 Mg Tbec (Aspirin) .Marland Kitchen.. 1 tab by mouth daily 8)  Diovan 160 Mg Tabs (Valsartan) .Marland Kitchen.. 1 by mouth daily 9)  Warfarin Sodium 5 Mg Tabs (Warfarin sodium) .... Take as directed 10)  Oxygen  .... Wear 2l/min continuously 11)  Ventolin Hfa 108 (90 Base) Mcg/act Aers (Albuterol sulfate) .Marland Kitchen.. 1-2 puffs inhaled every 4 hours as needed for shortness of breath 12)  Nitrostat 0.4 Mg Subl (Nitroglycerin) .Marland Kitchen.. 1 tab under tongue as needed for chest pain 13)  Torsemide 20 Mg Tabs (Torsemide) .... 2 tab by mouth once a day for fluid 14)  Acetaminophen 325 Mg Tabs (Acetaminophen) .... As needed 15)  Colace 100 Mg Caps (Docusate sodium) .Marland Kitchen.. 1 by mouth daily 16)  Cardura 1 Mg Tabs (Doxazosin mesylate) .... 1/2 tab by mouth daily 17)  Miralax Powd (Polyethylene glycol 3350) .... As needed 18)  Flomax 0.4 Mg Caps (Tamsulosin hcl) .Marland Kitchen.. 1 by mouth daily 19)  Doxazosin Mesylate 2 Mg Tabs (Doxazosin mesylate) .... 1/2 tab by mouth daily  Patient Instructions: 1)  Please schedule a follow-up appointment in 3-4 weeks for CHF.  2)  Weigh yourself every day.  Call me right away when your weight gets up to 248 lbs or when you have hard time breathing. 3)  Stay away from salt. 4)  Limit for fluid intake 1 1/2 liters daily.   5)  Take coumadin as directed.     Orders Added: 1)   Basic Met-[facility] [[id number]] 2)  INR/PT-[facility] [85610] 3)  Digoxin-[facility] [[id number]] 4)  A1C-[facility] [83036] 5)  [facility]- Est  Level 4 [29562]     Laboratory Results   Blood Tests   Date/Time Received: October 21, 2010 3:32 PM  Date/Time Reported: October 21, 2010 5:42 PM   HGBA1C: 10.1%   (Normal Range: Non-Diabetic - 3-6%   Control Diabetic - 6-8%)  INR: 1.1   (Normal Range: 0.88-1.12   Therap INR: 2.0-3.5) Comments: ...............test performed by......Marland KitchenBonnie A. [place], MLS (ASCP)cm       ANTICOAGULATION RECORD PREVIOUS REGIMEN & LAB RESULTS Anticoagulation Diagnosis:  Deep Vein Thrombosis - Leg (ICD-451.1) on  02/14/2009 Previous INR Goal Range:  2 - 3 on  02/14/2009 Previous INR:  2.3 on  10/07/2010 Previous Coumadin Dose(mg):  5mg  on  05/08/2010 Previous Regimen:  continue same:  5 mg - daily on  10/07/2010  NEW REGIMEN & LAB RESULTS Current INR: 1.1 Regimen: 5 mg daily  Provider: Teola Felipe Repeat testing in: after surgery Other Comments: ...............test performed by......Marland KitchenBonnie A. [place], MLS (ASCP)cm   Dose has been reviewed with patient or caretaker during this visit. Reviewed by: Dr. Janalyn Harder  Anticoagulation Visit Questionnaire Coumadin dose  missed/changed:  Yes Coumadin Dose Comments:  pt has been out of warfarin for 4 or 5 days Abnormal Bleeding Symptoms:  No  Any diet changes including alcohol intake, vegetables or greens since the last visit:  No Any illnesses or hospitalizations since the last visit:  Yes      Recent Illness/Hospitalizations:  was admitted 10-07-10 Any signs of clotting since the last visit (including chest discomfort, dizziness, shortness of breath, arm tingling, slurred speech, swelling or redness in leg):  No  MEDICATIONS LANTUS 100 UNIT/ML SOLN (INSULIN GLARGINE) inject 35 units each evening  disp: QS 1 month LIPITOR 80 MG TABS (ATORVASTATIN CALCIUM) 1 tab by mouth daily CARVEDILOL 12.5 MG TABS (CARVEDILOL) 1tab by mouth two times a  day SYNTHROID 100 MCG TABS (LEVOTHYROXINE SODIUM) 1 tablet by mouth daily DIGOXIN 0.25 MG TABS (DIGOXIN) 1 tab by mouth daily IMDUR 60 MG XR24H-TAB (ISOSORBIDE MONONITRATE) 1 tablet by mouth daily BAYER ASPIRIN EC LOW DOSE 81 MG TBEC (ASPIRIN) 1 tab by mouth daily DIOVAN 160 MG TABS (VALSARTAN) 1 by mouth daily WARFARIN SODIUM 5 MG TABS (WARFARIN SODIUM) take as directed * OXYGEN wear 2L/min continuously VENTOLIN HFA 108 (90 BASE) MCG/ACT AERS (ALBUTEROL SULFATE) 1-2 puffs inhaled every 4 hours as needed for shortness of breath NITROSTAT 0.4 MG SUBL (NITROGLYCERIN) 1 tab under tongue as needed for chest pain TORSEMIDE 20 MG TABS (TORSEMIDE) 2 tab by mouth once a day for fluid ACETAMINOPHEN 325 MG  TABS (ACETAMINOPHEN) as needed COLACE 100 MG CAPS (DOCUSATE SODIUM) 1 by mouth daily CARDURA 1 MG TABS (DOXAZOSIN MESYLATE) 1/2 tab by mouth daily MIRALAX  POWD (POLYETHYLENE GLYCOL 3350) as needed FLOMAX 0.4 MG CAPS (TAMSULOSIN HCL) 1 by mouth daily DOXAZOSIN MESYLATE 2 MG TABS (DOXAZOSIN MESYLATE) 1/2 tab by mouth daily

## 2010-10-29 NOTE — Progress Notes (Signed)
Summary: Checking on pt.   Phone Note Outgoing Call   Call placed by: [name] MD,  September 08, 2010 12:58 PM Call placed to: Patient Summary of Call: Called to check on pt.  He states that "I'm alive and kicking".  He states that he is breathing well, no dyspnea.  Using 2 L of oxygen, which is normal for him.  Very Kable LE edema.   He is awaiting for a ride to take him to Urology appt.  He is supposed to have the foley removed today.   Home health nurse: comes almost daily.   Physical therapist came yesterday. Has appt with Dr Antoine Poche on 09/10/10. Urinary retention:  Discussed that Medicare will not cover Flomax.  Asked pt to discuss this with Urology when he sees them.  I am concerned that if foley is removed and pt is not on this medication, then he won't be able to void.  I am hoping that Urology will have samples of Flomax.    I've called pt's [employer], pt's insurance company [phone], to request PA for Flomax.  This was approved by his insurance and was told that this did not require PA for #60 tabs for 30 days.  Asked insurance company recheck for confirmation because pt has not been approved per his pharmacy.  I was told that pt's cost is $4.24 for 30 days.   Called pt back and told him of approval.  Asked pt to call his pharmacy.  He agreed.  Initial call taken by: Cat Ta MD,  September 08, 2010 12:58 PM

## 2010-10-29 NOTE — Miscellaneous (Signed)
Summary: concerns about wt from [facility] RN  Clinical Lists Changes Nyoka Lint, rn at [facility] [phone]) left a message that his discharge wt in dec was 230. today he is 248.8. last friday her was 248.2. has 1+ pitting edema on lower extremities. wants to know if his meds need to be adjusted. called pt to make him an appt here. he already has one wednesday at 9am. called the nurse & told her md will review at [facility] visit.Golden Circle RN  October 05, 2010 10:32 AM

## 2010-10-29 NOTE — Medication Information (Signed)
Summary: Coumadin Clinic  Anticoagulant Therapy  Managed by: Inactive PCP: Cat Ta MD Supervising MD: Jens Som MD, Arlys John Indication 1: Deep Vein Thrombosis - Leg (ICD-451.1) Lab Used: [facility] Site: [facility] INR RANGE 2 - 3          Comments: Pt followed by [facility]  Allergies: 1)  Ace Inhibitors 2)  * Actos  Anticoagulation Management History:      Positive risk factors for bleeding include presence of serious comorbidities.  Negative risk factors for bleeding include an age less than 62 years old.  The bleeding index is 'intermediate risk'.  Positive CHADS2 values include History of CHF, History of HTN, and History of Diabetes.  Negative CHADS2 values include Age > 62 years old.  The start date was 12/25/2008.  His last INR was 4.2.  Anticoagulation responsible provider: Jens Som MD, Arlys John.  Exp: 06/2011.    Anticoagulation Management Assessment/Plan:      The patient's current anticoagulation dose is Warfarin sodium 5 mg tabs: take as directed.  The target INR is 2 - 3.  The next INR is due 1 week @ [facility] or Dr. Antoine Poche.  Anticoagulation instructions were given to patient.  Results were reviewed/authorized by Inactive.         Prior Anticoagulation Instructions: INR 1.9  Take Coumadin 2 tabs (10 mg) tomorrow, Saturday, August 13th.  Take Coumadin 1.5 tabs (7.5 mg) every day.  Return in 2 weeks.

## 2010-10-29 NOTE — Letter (Signed)
Summary: Inc Lantus dose  [facility]  [address]   [place], [place] [address]   Phone: [phone]  Fax: [phone]    10/22/2010  Lenin Kush [address] [place] [address]  [address] [place], [place]  [address]  Dear Mr. Bungert,  I would like to make a change to your Lantus dose.  Please increase Lantus to 40 units every day instead of 35 units.  Please continue to check your blood sugar and weight everyday.        Sincerely,   Maximina Pirozzi MD  Appended Document: Inc Lantus dose mailed

## 2010-10-29 NOTE — Progress Notes (Signed)
Summary: nuc pre-procedure  Phone Note Outgoing Call   Call placed by: Domenic Polite, CNMT,  September 09, 2010 4:16 PM Call placed to: Patient Reason for Call: Confirm/change Appt Summary of Call: Spoke with patient conerning his muga study on 09/10/10.

## 2010-10-29 NOTE — Progress Notes (Signed)
Summary: Pt out of his Warfarin x  1 week  Phone Note Other Incoming   Caller: Nyoka Lint  [facility] [phone] Summary of Call: Arline Asp was out seeing patient today.  Wanted to let us know patient is scheduled to see Dr. Janalyn Harder and have a PT/INR check tomorrow, but  patient has been out of his Warfarin for about 5 to 7 days.  Unable to contact patient because he has lost his cell phone.  Will need to address this at his office visit tomorrow. Initial call taken by: Terese Door,  October 20, 2010 3:05 PM

## 2010-10-29 NOTE — Progress Notes (Signed)
Summary: Elevated BS  Phone Note Other Incoming   Caller: Nyoka Lint with [facility] [phone] Summary of Call: Arline Asp calling to advise [place] that Mr. Golladay's blood sugars are slowly creeping up.  His blood sugars has been running in the upper 200's but this morning his FBS was 423mg /dl.   He checked it again at 2:30pm and it was 596mg /dl.  His FBS yesterday morning was 395mg /dl.  She also states that patient admits to not eating the food he should be eating.  He was currently taking his Lantus 35 units in the morning but Cindy instructed him to take his Lantus as night.  Pt. is scheduled to come in here tomorrow for PT/INR.   Initial call taken by: Terese Door,  September 23, 2010 4:22 PM  Follow-up for Phone Call        Reported to Dr. Jennette Kettle.   Pt's A1c >14.0%. for a LONG time so I think these nymbers are really pretty typical. No symptoms. No urgency. no new recs No further instructions needed at this time. Follow-up by: Terese Door,  September 23, 2010 4:26 PM

## 2010-10-29 NOTE — Progress Notes (Signed)
Summary: pt needs surgical clearence      waiting on [facility]  Phone Note From Other Clinic   Caller: Nurse/ Pam Request: Talk with Nurse, Talk with Provider Summary of Call: pt needs surgical clearence for PURP not scheduled until they get clearence pt also needs to stop coumadin for 5days. having it with Dr. Vernie Ammons office# [phone] ext [phone] fax# [phone]  Initial call taken by: Omer Jack,  October 15, 2010 1:26 PM  Follow-up for Phone Call        Patient is at moderate to high risk for any surgical procedure because of recent NQWMI and decompensated CHF. He has known diffuse CAD that on most recent cath was not amenable to revascularization.  He can come off of coumadin as needed for the planned procedure.  He has often been subtherapeutic with his INRs. Follow-up by: [name], MD, [facility],  October 18, 2010 8:33 PM  Additional Follow-up for Phone Call Additional follow up Details #1::        Information faxed to Dr Vernie Ammons Additional Follow-up by: Charolotte Capuchin, RN,  October 19, 2010 3:55 PM

## 2010-10-29 NOTE — Progress Notes (Signed)
Summary: verbal orders  Phone Note From Other Clinic Call back at [phone]   Caller: Olegario Messier [facility] Summary of Call: needs verbal orders to continue PT for 4-5 weeks Initial call taken by: De Nurse,  September 22, 2010 8:59 AM  Follow-up for Phone Call        Please give verbal order per Ta.  thank you.  Cat Ta MD  September 22, 2010 4:35 PM  Follow-up by: [name] MD,  September 22, 2010 4:35 PM  Additional Follow-up for Phone Call Additional follow up Details #1::        gave verbal orders Additional Follow-up by: Jimmy Footman, CMA,  September 23, 2010 8:49 AM

## 2010-10-29 NOTE — Miscellaneous (Signed)
Summary: Rx for Doxazosin  Clinical Lists Changes  Medications: Added new medication of DOXAZOSIN MESYLATE 2 MG TABS (DOXAZOSIN MESYLATE) 1/2 tab by mouth daily - Signed Rx of DOXAZOSIN MESYLATE 2 MG TABS (DOXAZOSIN MESYLATE) 1/2 tab by mouth daily;  #15 x 3;  Signed;  Entered by: [name] MD;  Authorized by: [name] MD;  Method used: Electronically to [facility] [address] [place], [place], [place]  [address], Ph: [phone], Fax: [phone]    Prescriptions: DOXAZOSIN MESYLATE 2 MG TABS (DOXAZOSIN MESYLATE) 1/2 tab by mouth daily  #15 x 3   Entered and Authorized by:   [name] MD   Signed by:   [name] MD on 10/14/2010   Method used:   Electronically to        [facility] (retail)       [address]       [place], [place]  [address]       Ph: [phone]       Fax: [phone]   RxID:   [id number]

## 2010-10-29 NOTE — Letter (Signed)
Summary: Generic Letter  Architectural technologist, Main Office  [address]   [place], [place] [address]   Phone: [phone]  Fax: [phone]        September 29, 2010 MRN: [id number]    [facility] [address] [place], [place]  [address]    Dear Mr. Dennen,         Sincerely,  Lorne Skeens  This letter has been electronically signed by your physician.

## 2010-10-29 NOTE — Initial Assessments (Signed)
Summary: Acute Decompensated Heart Failure Attending note  I saw Mr [name] with Dr Cat Ta.  I agree with her findings and plans as documented in her note of admission for today.  Acute Issues  Acute Decompensated Heart Failure in patient with Systolic Left Ventricular dysfunction secondary to ischemic cardiomyopathy.  Ejection Fraction was 20-25% on recent Echocardiogram.    Mr [name] has gained thirty pounds since hospital discharge on 09/16/10 office visit at [facility].             He has been experiencing progressive dyspnea on exertion over last week with disabling SOB just    walking in his trailer home in last few days.   Patient increased his toresimide to from 20 mg daily to 40 mg daily for three days last week without    increase in urine output or improvement in shortness of breath.     This will be his 3rd CHF admission in last 6 weeks.   Mr [name] missed his appointment with Dr [name] last week because of difficulty with transportation.  He      did not call [facility] for transportation. He just got a body weight scale at home in last 5 days.    Patient lives alone with support from a neighbor.  Plan: Admit to hospital Start Demadex 40 mg IV two times a day, metolazone 5 mg daily for adjuvant diuresis agent CXR, EKG, serial cardiac enzymes x 2 Strict I/O's, daily BMET while aggressively diuresing.  Case Manager consult for patient education about accessing [facility] transportation.  Develop a protocol for patient to increase and decrease his home toresimide dose based on his body weight changes and performance with 6-minute walk.

## 2010-10-29 NOTE — Initial Assessments (Signed)
Summary: CHF exacerbation    Vital Signs:  Patient profile:   60 year old male Height:      69 inches Weight:      252.2 pounds BMI:     37.38 O2 Sat:      91 % on 2 L/min Pulse rate:   82 / minute BP sitting:   106 / 66  (left arm) Cuff size:   regular  Vitals Entered By: Tessie Fass CMA (October 07, 2010 9:30 AM)  O2 Flow:  2 L/min CC: dyspnea, bilateral leg edema Is Patient Diabetic? Yes Pain Assessment Patient in pain? no        Primary Care Provider:  Consuello Lassalle MD  CC:  dyspnea and bilateral leg edema.  History of Present Illness: 60 y/o M with multiple medical conditions including CAD, HLD, HTN, DM, DVT, ischemic CM, systolic CHF with EF 20-25% who presents for dyspnea and bilateral leg edema.  He has decreased exercise tolerance as he cannot walk across the street to his friend's house before becoming dyspneic.  He also has increased weight of 20lbs since discharge from hospital on Sep 15, 2010.  Pt states that lower extremety edema is at least double the size they are normally and that he also feels that his abdomen is bigger than normal.  He has difficulty lying flat to sleep as he comes dyspneic.  He started weighing himself 5 days ago and his weights from home are 251 today, 250 yesterday, 248 day before.    Current Medications (verified): 1)  Lantus 100 Unit/ml Soln (Insulin Glargine) .... Inject 35 Units Each Evening  Disp: Qs 1 Month 2)  Lipitor 80 Mg Tabs (Atorvastatin Calcium) .Marland Kitchen.. 1 Tab By Mouth Daily 3)  Carvedilol 12.5 Mg Tabs (Carvedilol) .Marland Kitchen.. 1tab By Mouth Two Times A Day 4)  Synthroid 100 Mcg Tabs (Levothyroxine Sodium) .Marland Kitchen.. 1 Tablet By Mouth Daily 5)  Digoxin 0.25 Mg Tabs (Digoxin) .Marland Kitchen.. 1 Tab By Mouth Daily 6)  Imdur 60 Mg Xr24h-Tab (Isosorbide Mononitrate) .Marland Kitchen.. 1 Tablet By Mouth Daily 7)  Bayer Aspirin Ec Low Dose 81 Mg Tbec (Aspirin) .Marland Kitchen.. 1 Tab By Mouth Daily 8)  Diovan 160 Mg Tabs (Valsartan) .Marland Kitchen.. 1 By Mouth Daily 9)  Warfarin Sodium 5 Mg Tabs  (Warfarin Sodium) .... Take As Directed 10)  Oxygen .... Wear 2l/min Continuously 11)  Ventolin Hfa 108 (90 Base) Mcg/act Aers (Albuterol Sulfate) .Marland Kitchen.. 1-2 Puffs Inhaled Every 4 Hours As Needed For Shortness of Breath 12)  Nitrostat 0.4 Mg Subl (Nitroglycerin) .Marland Kitchen.. 1 Tab Under Tongue As Needed For Chest Pain 13)  Torsemide 20 Mg Tabs (Torsemide) .Marland Kitchen.. 1 Tab By Mouth Once A Day For Fluid 14)  Acetaminophen 325 Mg  Tabs (Acetaminophen) .... As Needed 15)  Colace 100 Mg Caps (Docusate Sodium) .Marland Kitchen.. 1 By Mouth Daily 16)  Cardura 1 Mg Tabs (Doxazosin Mesylate) .... 1/2 Tab By Mouth Daily 17)  Miralax  Powd (Polyethylene Glycol 3350) .... As Needed 18)  Flomax 0.4 Mg Caps (Tamsulosin Hcl) .Marland Kitchen.. 1 By Mouth Daily  Allergies (verified): 1)  Ace Inhibitors 2)  * Actos  Past History:  Past Medical History: Last updated: 09/17/2010 -CARDIOMYOPATHY, ISCHEMIC (ICD-414.8) (EF 42%) -CAD (ICD-414.00). Stent placement Right coronary artery 2007. Stenting LAD and cutting balloon of the diagonal (history of an inferior myocardial infarction.  Stent placement to the right coronary artery in May 2007.  He also has stenting to his LAD and a cutting balloon of the diagonal.  The most  recent catheterization in June 2008 demonstrated distal obstructive LAD disease, but otherwise no acute disease.) 1. History of coronary artery disease with last catheterization in     January 2008 showing an ejection fraction of 35%, diagonal 40%,     distal left anterior descending artery 95% (2-mm vessel),     circumflex 70% and no in-stent restenosis in the right coronary     artery, medical therapy was recommended. 2. Ischemic cardiomyopathy. 3. Ongoing tobacco abuse.      - PFT's June 17, 2010 >> no airflow obstruction 4. Hypertension. 5. Hyperlipidemia. 6. Hypothyroidism. 7. Obstructive sleep apnea.       - Sleep study 06/23/10>> AHI 54/hr with desat to 74% 8. History of deep venous thrombosis, on chronic Coumadin. 9.  Allergy or intolerance to ACE INHIBITORS with cough and PIOGLITAZONE. 10.History of stent to the right coronary artery and staged stent to     the left anterior descending artery with percutaneous coronary     intervention of the first diagonal branch in May 2002 secondary to     non-ST segment elevation myocardial infarction. 11.Obesity. 12.History of spinal fusion. 13.Anxiety. 14.History of Fournier gangrene, status post surgical debridement x2     in 2006. 15.History of diabetic nephropathy. 16.Osteoarthritis. 17. Chronic respiratory failure       - CTangiogram Chest 05/21/2010 bilateral effusion, no PE       - No desat walking on 2lpm June 17, 2010  18.  Systolic CHF Left ventricle: Echo 08/2010:  The estimated EF  20% to 25%.  19. history of PE, on coumadin  Past Surgical History: Last updated: 09/04/2010 Lumbar spinal fusion - about 1987 - 09/27/1985,  Tonsillectomy - as a child - 09/27/1954  Family History: Last updated: 01/21/2009  Mother; he does not know her health problems.  Father   is unknown to the patient.  A sister had breast cancer.      Social History: Last updated: 09/29/2010 The patient is divorced.   He lives alone. 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   He has not spoken to his on ly daughter since age 89 His sister is his preferred advocate FULL CODE. Drinks alcohol current smoker, began age 25, 1 pack per week.   Neighbor: Brandon Melnick [phone]  Risk Factors: Smoking Status: current (08/26/2010) Packs/Day: 0.25 (08/26/2010)  Review of Systems General:  Complains of fatigue; denies chills, fever, loss of appetite, and weight loss. ENT:  Denies difficulty swallowing, hoarseness, and nasal congestion. CV:  Complains of difficulty breathing while lying down, fatigue, shortness of breath with exertion, swelling of feet, and weight gain; denies chest pain or discomfort, fainting, leg cramps with exertion, lightheadness, near fainting, palpitations, and  swelling of hands. Resp:  Complains of shortness of breath; denies chest discomfort, chest pain with inspiration, cough, coughing up blood, sputum productive, and wheezing. GI:  Denies abdominal pain, bloody stools, change in bowel habits, constipation, diarrhea, nausea, and vomiting. GU:  Denies dysuria. MS:  Denies joint pain and joint swelling.  Physical Exam  General:  Well-developed,well-nourished,in no mild distress; alert,appropriate and cooperative throughout examination Head:  normocephalic and atraumatic.   Mouth:  Oral mucosa and oropharynx without lesions or exudates. Lungs:  Good respiratory effort Good air movement Very minimal bibasilar crackles No wheezing Heart:  Normal rate and regular rhythm. S1 and S2 normal without gallop, murmur, click, rub or other extra sounds. Abdomen:  Hypoactive bowel sounds, Nontender, nondisteded, no fluid shift.   Pulses:  Cannot palpate  LE pulses +UE pulses Extremities:  +3 edema.  R LE edema > L LE Neurologic:  Nonfocal    Impression & Recommendations:  Problem # 1:  CHRONIC SYSTOLIC HEART FAILURE (ICD-428.22) Assessment Deteriorated Pt with CHF with EF 20-25% from [facility] 08/2010 and increased weight of 20lbs and worsening dyspenic on exertion.  Likely CHF exacerbation.  Will admit to [facility] telemetry and start aggressive diuresis with Torsemide 40mg  IV two times a day.  Will will monitor pt's daily weight and strict I&O.  Will get BNP, CXR, CBC, and Bmet.   We will also rule out for ACS with CE Q8h x 3, EKG x 2.    His updated medication list for this problem includes:    Carvedilol 12.5 Mg Tabs (Carvedilol) .Marland Kitchen... 1tab by mouth two times a day    Digoxin 0.25 Mg Tabs (Digoxin) .Marland Kitchen... 1 tab by mouth daily    Bayer Aspirin Ec Low Dose 81 Mg Tbec (Aspirin) .Marland Kitchen... 1 tab by mouth daily    Diovan 160 Mg Tabs (Valsartan) .Marland Kitchen... 1 by mouth daily    Warfarin Sodium 5 Mg Tabs (Warfarin sodium) .Marland Kitchen... Take as directed    Torsemide 20 Mg Tabs  (Torsemide) .Marland Kitchen... 1 tab by mouth once a day for fluid  Orders: Hospital Admit-[facility] (00000)  Problem # 2:  RENAL FAILURE, ACUTE (ICD-584.9) Pt had ARF with Cr 2.65 during last admission thought to be from prenal + postobstructive etiologies.  Baseline Cr 1.1-1.2.  Will get Bmet and monitor closely as we diurese the pt during this hospitalization.    Problem # 3:  DIABETES MELLITUS, TYPE II, UNCONTROLLED, WITH COMPLICATIONS (ICD-250.92) Pt's DM has been difficult to control due to noncompliance and poor insight.  Last A1C >14 in Nov.  Will continue Lantus 35 Units at bedtime and monitor CBG.  May need to add SSI during hospitalization.   His updated medication list for this problem includes:    Lantus 100 Unit/ml Soln (Insulin glargine) ..... Inject 35 units each evening  disp: qs 1 month    Bayer Aspirin Ec Low Dose 81 Mg Tbec (Aspirin) .Marland Kitchen... 1 tab by mouth daily    Diovan 160 Mg Tabs (Valsartan) .Marland Kitchen... 1 by mouth daily  Problem # 4:  DEEP VENOUS THROMBOPHLEBITIS, LEG, RIGHT (ICD-453.40) History of DVT.  INR therapeutic in clinic today at 2.3.  Will continue coumadin with dosing per pharmacy.   Problem # 5:  HYPERLIPIDEMIA (ICD-272.4) LDL at goal from [facility] 03/2010 with LDL 53, HDL 25, Trig 112.  Will continue Lipitor 80mg  at bedtime.  His updated medication list for this problem includes:    Lipitor 80 Mg Tabs (Atorvastatin calcium) .Marland Kitchen... 1 tab by mouth daily  Problem # 6:  HYPOTHYROIDISM, UNSPECIFIED (ICD-244.9) TSH 1.9 in 03/2010.  Will continue synthroid daily.   His updated medication list for this problem includes:    Synthroid 100 Mcg Tabs (Levothyroxine sodium) .Marland Kitchen... 1 tablet by mouth daily  Problem # 7:  URINARY RETENTION (ICD-788.20) Urinary retention from hosp in Nov.  Pt is followed by [facility] and continues to use foley catheter.  Will continue Flomax 0.4mg  daily.   Problem # 8:  FEN/GI Low salt diet, DM diet Heplock IV  Problem # 9:   Prophylaxis Coumadin  Problem # 10:  Code Status Full Code  Problem # 11:  Disposition Pt has poor insight into his disease and has difficulty getting to his appointments.  This is his 3rd admission for similar symptoms  since Nov 2011.  I think that he has proven that it is difficult for him to care for himself at home, even with HHRN and HHPT.  Pt is amendable to going to an assisted living facility.  Will consult SW and PT for evaluation for placement.   Complete Medication List: 1)  Lantus 100 Unit/ml Soln (Insulin glargine) .... Inject 35 units each evening  disp: qs 1 month 2)  Lipitor 80 Mg Tabs (Atorvastatin calcium) .Marland Kitchen.. 1 tab by mouth daily 3)  Carvedilol 12.5 Mg Tabs (Carvedilol) .Marland Kitchen.. 1tab by mouth two times a day 4)  Synthroid 100 Mcg Tabs (Levothyroxine sodium) .Marland Kitchen.. 1 tablet by mouth daily 5)  Digoxin 0.25 Mg Tabs (Digoxin) .Marland Kitchen.. 1 tab by mouth daily 6)  Imdur 60 Mg Xr24h-tab (Isosorbide mononitrate) .Marland Kitchen.. 1 tablet by mouth daily 7)  Bayer Aspirin Ec Low Dose 81 Mg Tbec (Aspirin) .Marland Kitchen.. 1 tab by mouth daily 8)  Diovan 160 Mg Tabs (Valsartan) .Marland Kitchen.. 1 by mouth daily 9)  Warfarin Sodium 5 Mg Tabs (Warfarin sodium) .... Take as directed 10)  Oxygen  .... Wear 2l/min continuously 11)  Ventolin Hfa 108 (90 Base) Mcg/act Aers (Albuterol sulfate) .Marland Kitchen.. 1-2 puffs inhaled every 4 hours as needed for shortness of breath 12)  Nitrostat 0.4 Mg Subl (Nitroglycerin) .Marland Kitchen.. 1 tab under tongue as needed for chest pain 13)  Torsemide 20 Mg Tabs (Torsemide) .Marland Kitchen.. 1 tab by mouth once a day for fluid 14)  Acetaminophen 325 Mg Tabs (Acetaminophen) .... As needed 15)  Colace 100 Mg Caps (Docusate sodium) .Marland Kitchen.. 1 by mouth daily 16)  Cardura 1 Mg Tabs (Doxazosin mesylate) .... 1/2 tab by mouth daily 17)  Miralax Powd (Polyethylene glycol 3350) .... As needed 18)  Flomax 0.4 Mg Caps (Tamsulosin hcl) .Marland Kitchen.. 1 by mouth daily  Other Orders: INR/PT-[facility] ([address])   Orders Added: 1)  INR/PT-[facility] [85610] 2)  Hospital  Admit-[facility] [00000]    Laboratory Results   Blood Tests      INR: 2.3   (Normal Range: 0.88-1.12   Therap INR: 2.0-3.5)      ANTICOAGULATION RECORD PREVIOUS REGIMEN & LAB RESULTS Anticoagulation Diagnosis:  Deep Vein Thrombosis - Leg (ICD-451.1) on  02/14/2009 Previous INR Goal Range:  2 - 3 on  02/14/2009 Previous INR:  2.1 on  09/24/2010 Previous Coumadin Dose(mg):  5mg  on  05/08/2010 Previous Regimen:  5 mg - daily on  09/24/2010  NEW REGIMEN & LAB RESULTS Current INR: 2.3 Regimen: continue same:  5 mg - daily  Provider: Kirbie Stodghill Repeat testing in: pt admitted Other Comments: ...............test performed by......Marland KitchenBonnie A. [place], MLS (ASCP)cm   Dose has been reviewed with patient or caretaker during this visit. Reviewed by: Mosie Lukes (ASCP)cm  Anticoagulation Visit Questionnaire Coumadin dose missed/changed:  No Abnormal Bleeding Symptoms:  No  Any diet changes including alcohol intake, vegetables or greens since the last visit:  No Any illnesses or hospitalizations since the last visit:  No Any signs of clotting since the last visit (including chest discomfort, dizziness, shortness of breath, arm tingling, slurred speech, swelling or redness in leg):  Yes      Signs of Clotting:  legs swelling,  SOB, has appointment with MD now  MEDICATIONS LANTUS 100 UNIT/ML SOLN (INSULIN GLARGINE) inject 35 units each evening  disp: QS 1 month LIPITOR 80 MG TABS (ATORVASTATIN CALCIUM) 1 tab by mouth daily CARVEDILOL 12.5 MG TABS (CARVEDILOL) 1tab by mouth two times a day SYNTHROID 100 MCG TABS (LEVOTHYROXINE SODIUM) 1 tablet  by mouth daily DIGOXIN 0.25 MG TABS (DIGOXIN) 1 tab by mouth daily IMDUR 60 MG XR24H-TAB (ISOSORBIDE MONONITRATE) 1 tablet by mouth daily BAYER ASPIRIN EC LOW DOSE 81 MG TBEC (ASPIRIN) 1 tab by mouth daily DIOVAN 160 MG TABS (VALSARTAN) 1 by mouth daily WARFARIN SODIUM 5 MG TABS (WARFARIN SODIUM) take as directed * OXYGEN wear 2L/min  continuously VENTOLIN HFA 108 (90 BASE) MCG/ACT AERS (ALBUTEROL SULFATE) 1-2 puffs inhaled every 4 hours as needed for shortness of breath NITROSTAT 0.4 MG SUBL (NITROGLYCERIN) 1 tab under tongue as needed for chest pain TORSEMIDE 20 MG TABS (TORSEMIDE) 1 tab by mouth once a day for fluid ACETAMINOPHEN 325 MG  TABS (ACETAMINOPHEN) as needed COLACE 100 MG CAPS (DOCUSATE SODIUM) 1 by mouth daily CARDURA 1 MG TABS (DOXAZOSIN MESYLATE) 1/2 tab by mouth daily MIRALAX  POWD (POLYETHYLENE GLYCOL 3350) as needed FLOMAX 0.4 MG CAPS (TAMSULOSIN HCL) 1 by mouth daily

## 2010-10-29 NOTE — Assessment & Plan Note (Signed)
Summary: hosp f/u   Vital Signs:  Patient profile:   60 year old male Weight:      222 pounds Temp:     97.7 degrees F oral Pulse rate:   77 / minute Pulse rhythm:   regular BP sitting:   138 / 73  (left arm) Cuff size:   regular  Vitals Entered By: Loralee Pacas CMA (September 17, 2010 2:42 PM) CC: hospital follow up   Primary Care Provider:  [name] MD  CC:  hospital follow up.  History of Present Illness: 60 y/o M with multiple medical problems is here for hospital f/u.  He was admitted for generalized malaise and found to be in acute renal failure from over diuresis, urinary retention, uti.    ARF: His diuretic regimen was adjusted during hospital stay and renal functioned returned to baseline.  He was discharged with changes to his medication that included d/c aldactone and decrease in torsemide from 40mg  to 20mg  daily.  Pt looks well today and states that he is feeling much better.    Urinary retention: During prior hospitaliztion pt was found to have urinary retention likely from BPH.  A foley catheter was placed and pt was discharged with a Rx for Flomax and to f/u with urology.  When he was admitted this time the foley catheter was removed by urology and pt again had urinary retention.  Flomax was resumed and another catheter is placed.  Pt will need to f/u with urology again for this.    UTI: Pt was found to have UTI likley from instrumentation with foley catheter.  He was d/c with Avelox for 10 days.  Pt currently does not complain of dysuria, fever/chils.     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  His coumadin dosing is followed by Dr Hochrein's office.  Pt was found to be supratherapeutic with INR >4 during hospitalization.  His coumadin dosing was adjusted and he is currently taking 7.5mg  daily.  No complaints of LE edema/pain, dyspnea, chest pain.   Current Medications (verified): 1)  Lantus 100 Unit/ml Soln (Insulin Glargine) .... Inject  35 Units Each Evening  Disp: Qs 1 Month 2)  Lipitor 80 Mg Tabs (Atorvastatin Calcium) .Marland Kitchen.. 1 Tab By Mouth Daily 3)  Carvedilol 12.5 Mg Tabs (Carvedilol) .Marland Kitchen.. 1tab By Mouth Two Times A Day 4)  Synthroid 100 Mcg Tabs (Levothyroxine Sodium) .Marland Kitchen.. 1 Tablet By Mouth Daily 5)  Digoxin 0.25 Mg Tabs (Digoxin) .Marland Kitchen.. 1 Tab By Mouth Daily 6)  Imdur 60 Mg Xr24h-Tab (Isosorbide Mononitrate) .Marland Kitchen.. 1 Tablet By Mouth Daily 7)  Bayer Aspirin Ec Low Dose 81 Mg Tbec (Aspirin) .Marland Kitchen.. 1 Tab By Mouth Daily 8)  Diovan 160 Mg Tabs (Valsartan) .Marland Kitchen.. 1 By Mouth Daily 9)  Warfarin Sodium 5 Mg Tabs (Warfarin Sodium) .... Take As Directed 10)  Oxygen .... Wear 2l/min Continuously 11)  Ventolin Hfa 108 (90 Base) Mcg/act Aers (Albuterol Sulfate) .Marland Kitchen.. 1-2 Puffs Inhaled Every 4 Hours As Needed For Shortness of Breath 12)  Nitrostat 0.4 Mg Subl (Nitroglycerin) .Marland Kitchen.. 1 Tab Under Tongue As Needed For Chest Pain 13)  Torsemide 20 Mg Tabs (Torsemide) .Marland Kitchen.. 1 Tab By Mouth Once A Day For Fluid 14)  Acetaminophen 325 Mg  Tabs (Acetaminophen) .... As Needed 15)  Colace 100 Mg Caps (Docusate Sodium) .Marland Kitchen.. 1 By Mouth Daily 16)  Cardura 1 Mg Tabs (Doxazosin Mesylate) .... 1/2 Tab By Mouth Daily 17)  Miralax  Powd (Polyethylene Glycol 3350) .... As Needed 18)  Flomax 0.4 Mg Caps (Tamsulosin Hcl) .Marland Kitchen.. 1 By Mouth Daily 19)  Levofloxacin 750 Mg Tabs (Levofloxacin) .Marland Kitchen.. 1 Tab By Mouth Daily X 10 Days  Allergies (verified): 1)  Ace Inhibitors 2)  * Actos  Past History:  Past Medical History: -CARDIOMYOPATHY, ISCHEMIC (ICD-414.8) (EF 42%) -CAD (ICD-414.00). Stent placement Right coronary artery 2007. Stenting LAD and cutting balloon of the diagonal (history of an inferior myocardial infarction.  Stent placement to the right coronary artery in May 2007.  He also has stenting to his LAD and a cutting balloon of the diagonal.  The most recent catheterization in June 2008 demonstrated distal obstructive LAD disease, but otherwise no acute disease.) 1.  History of coronary artery disease with last catheterization in     January 2008 showing an ejection fraction of 35%, diagonal 40%,     distal left anterior descending artery 95% (2-mm vessel),     circumflex 70% and no in-stent restenosis in the right coronary     artery, medical therapy was recommended. 2. Ischemic cardiomyopathy. 3. Ongoing tobacco abuse.      - PFT's June 17, 2010 >> no airflow obstruction 4. Hypertension. 5. Hyperlipidemia. 6. Hypothyroidism. 7. Obstructive sleep apnea.       - Sleep study 06/23/10>> AHI 54/hr with desat to 74% 8. History of deep venous thrombosis, on chronic Coumadin. 9. Allergy or intolerance to ACE INHIBITORS with cough and PIOGLITAZONE. 10.History of stent to the right coronary artery and staged stent to     the left anterior descending artery with percutaneous coronary     intervention of the first diagonal branch in May 2002 secondary to     non-ST segment elevation myocardial infarction. 11.Obesity. 12.History of spinal fusion. 13.Anxiety. 14.History of Fournier gangrene, status post surgical debridement x2     in 2006. 15.History of diabetic nephropathy. 16.Osteoarthritis. 17. Chronic respiratory failure       - CTangiogram Chest 05/21/2010 bilateral effusion, no PE       - No desat walking on 2lpm June 17, 2010  18.  Systolic CHF Left ventricle: Echo 08/2010:  The estimated EF  20% to 25%.  19. history of PE, on coumadin  Review of Systems       per hpi   Physical Exam  General:  Well-developed,well-nourished,in no acute distress; alert,appropriate and cooperative throughout examination. vitals reviewed.  Mouth:  MMM Lungs:  Exp wheezing in lower lobes Decreased air movements  Heart:  RRR No murmur or gallop Abdomen:  Bowel sounds positive,abdomen soft and non-tender without masses, organomegaly or hernias noted.  obese, no fluid shift, no edema Genitalia:  foley in place Pulses:  +2 bilaterally  Extremities:   trace left pedal edema.   Neurologic:  alert & oriented X3.   Cervical Nodes:  No lymphadenopathy noted   Impression & Recommendations:  Problem # 1:  CHRONIC SYSTOLIC HEART FAILURE (ICD-428.22) Assessment Improved Will continue current meds.  Pt not dyspneic.  He is not using his O2 currently and appears well.  He appears euvolemic.  Will need to f/u closely to monitor diuresesis need vs worsening renal function.   The following medications were removed from the medication list:    Aldactone 25 Mg Tabs (Spironolactone) .Marland Kitchen... 1/2 by mouth daily His updated medication list for this problem includes:    Carvedilol 12.5 Mg Tabs (Carvedilol) .Marland Kitchen... 1tab by mouth two times a day    Digoxin 0.25 Mg Tabs (Digoxin) .Marland KitchenMarland KitchenMarland KitchenMarland Kitchen  1 tab by mouth daily    Bayer Aspirin Ec Low Dose 81 Mg Tbec (Aspirin) .Marland Kitchen... 1 tab by mouth daily    Diovan 160 Mg Tabs (Valsartan) .Marland Kitchen... 1 by mouth daily    Warfarin Sodium 5 Mg Tabs (Warfarin sodium) .Marland Kitchen... Take as directed    Torsemide 20 Mg Tabs (Torsemide) .Marland Kitchen... 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  I've made adjustment to coumadin dosing (skip next to days, then resume at 5mg  daily).  Pt to rtc in one week for INR check.  Unfortunately pt has poor insight into his medical conditions and seems very upset at ther frequent visits he has to make to physician offices.  I've tried to explain the importance of these visits to him.   Orders: INR/PT-[facility] (86578) [facility]- Est  Level 4 (46962)  Problem # 3:  RENAL FAILURE, ACUTE (ICD-584.9) Assessment: Unchanged ARF on admission to hospital with Cr of 2.65 and baseline of 1.0-1.2.  Diuretics were adjusted during hospitalization.  Pt does not appear too dry.  Will check Bmet today.  Will need close f/u to adjust torsemide dose.    Orders: Basic Met-[facility] ([address]284-13244) [facility]-  Est  Level 4 (01027)  Problem # 4:  UTI (ICD-599.0) Assessment: Unchanged  Asymptomatic.  Cont antibiotic as directed.    His updated medication list for this problem includes:    Levofloxacin 750 Mg Tabs (Levofloxacin) .Marland Kitchen... 1 tab by mouth daily x 10 days  Orders: [facility]- Est  Level 4 (99214)  Complete Medication List: 1)  Lantus 100 Unit/ml Soln (Insulin glargine) .... Inject 35 units each evening  disp: qs 1 month 2)  Lipitor 80 Mg Tabs (Atorvastatin calcium) .Marland Kitchen.. 1 tab by mouth daily 3)  Carvedilol 12.5 Mg Tabs (Carvedilol) .Marland Kitchen.. 1tab by mouth two times a day 4)  Synthroid 100 Mcg Tabs (Levothyroxine sodium) .Marland Kitchen.. 1 tablet by mouth daily 5)  Digoxin 0.25 Mg Tabs (Digoxin) .Marland Kitchen.. 1 tab by mouth daily 6)  Imdur 60 Mg Xr24h-tab (Isosorbide mononitrate) .Marland Kitchen.. 1 tablet by mouth daily 7)  Bayer Aspirin Ec Low Dose 81 Mg Tbec (Aspirin) .Marland Kitchen.. 1 tab by mouth daily 8)  Diovan 160 Mg Tabs (Valsartan) .Marland Kitchen.. 1 by mouth daily 9)  Warfarin Sodium 5 Mg Tabs (Warfarin sodium) .... Take as directed 10)  Oxygen  .... Wear 2l/min continuously 11)  Ventolin Hfa 108 (90 Base) Mcg/act Aers (Albuterol sulfate) .Marland Kitchen.. 1-2 puffs inhaled every 4 hours as needed for shortness of breath 12)  Nitrostat 0.4 Mg Subl (Nitroglycerin) .Marland Kitchen.. 1 tab under tongue as needed for chest pain 13)  Torsemide 20 Mg Tabs (Torsemide) .Marland Kitchen.. 1 tab by mouth once a day for fluid 14)  Acetaminophen 325 Mg Tabs (Acetaminophen) .... As needed 15)  Colace 100 Mg Caps (Docusate sodium) .Marland Kitchen.. 1 by mouth daily 16)  Cardura 1 Mg Tabs (Doxazosin mesylate) .... 1/2 tab by mouth daily 17)  Miralax Powd (Polyethylene glycol 3350) .... As needed 18)  Flomax 0.4 Mg Caps (Tamsulosin hcl) .Marland Kitchen.. 1 by mouth daily 19)  Levofloxacin 750 Mg Tabs (Levofloxacin) .Marland Kitchen.. 1 tab by mouth daily x 10 days  Patient Instructions: 1)  Please schedule a follow-up appointment in 2-3 weeks for breathing and fluid. 2)  I've checked all your medications and the only one that is  missing is the Flomax.  Please  look for this at home.  3)      Orders Added: 1)  INR/PT-[facility] [85610] 2)  Basic Met-[facility] [[id number]] 3)  [facility]- Est  Level 4 [08657]     ANTICOAGULATION RECORD PREVIOUS REGIMEN & LAB RESULTS Anticoagulation Diagnosis:  Deep Vein Thrombosis - Leg (ICD-451.1) on  02/14/2009 Previous INR Goal Range:  2 - 3 on  02/14/2009 Previous INR:  1.0 on  08/26/2010 Previous Coumadin Dose(mg):  5mg  on  05/08/2010 Previous Regimen:  admitted on  08/26/2010  NEW REGIMEN & LAB RESULTS Current INR: 4.2 Regimen: hold Fri and Sat;  then take 5 mg daily  Provider: Ta Repeat testing in: 1 week @ [facility] or Dr. Antoine Poche Other Comments: ...............test performed by......Marland KitchenBonnie A. [place], MLS (ASCP)cm   Dose has been reviewed with patient or caretaker during this visit. Reviewed by: Dr. Janalyn Harder

## 2010-10-29 NOTE — Progress Notes (Signed)
Summary: phone # is d/c - sending letter  Phone Note Outgoing Call   Call placed by: Lorne Skeens,  September 29, 2010 11:21 AM Summary of Call: Call patient to set up an appt to see Dr. Antoine Poche PA- Lorin Picket, phone # is disconnected [phone]. Per Pam  RN, advise [name] Dr. Antoine Poche scheduler to send a letter advising patient to call the office to set up an appointment.  Initial call taken by: Lorne Skeens,  September 29, 2010 11:25 AM

## 2010-10-29 NOTE — Initial Assessments (Signed)
Summary: Hospital Admission     Referring Provider:  Sandrea Hughs MD Primary Provider:  [name] MD   History of Present Illness: Pt. is a 60 y/o c/m with CHF EF 34% recently dc'd on Dec. 9th with exacerbation, and renal impairment. He was at home today and started to feel weak and had difficulty getting up becuase his legs were weak. He said he "didnt feel well" He has no specific symptoms. He is peeing and stooling well. He is eating and drinking regularly although probably poor fluid intake. He is still smoking cigarettes. He has no fever, no chills, no night sweats. He complains of chest pain that was aching, that has since resolved. His abdomen is hurting him, but non-specifically, and not reproducibly on palpation. His lungs are clearand he has no evidence of fluid overload.   Problems Prior to Update: 1)  Cough  (ICD-786.2) 2)  Chest Pain Unspecified  (ICD-786.50) 3)  Antitussives Caus Adverse Effect Therapeutic Use  (ICD-E945.4) 4)  Obstructive Sleep Apnea  (ICD-327.23) 5)  Diabetes Mellitus, Type II, Uncontrolled, With Complications  (ICD-250.92) 6)  Deep Venous Thrombophlebitis, Leg, Right  (ICD-453.40) 7)  Cad  (ICD-414.00) 8)  Hyperlipidemia  (ICD-272.4) 9)  Hypertension, Benign Systemic  (ICD-401.1) 10)  Chronic Systolic Heart Failure  (ICD-428.22) 11)  Cardiomyopathy, Ischemic  (ICD-414.8) 12)  Hypothyroidism, Unspecified  (ICD-244.9) 13)  COPD  (ICD-496)  Current Problems (verified): 1)  Cough  (ICD-786.2) 2)  Chest Pain Unspecified  (ICD-786.50) 3)  Antitussives Caus Adverse Effect Therapeutic Use  (ICD-E945.4) 4)  Obstructive Sleep Apnea  (ICD-327.23) 5)  Diabetes Mellitus, Type II, Uncontrolled, With Complications  (ICD-250.92) 6)  Deep Venous Thrombophlebitis, Leg, Right  (ICD-453.40) 7)  Cad  (ICD-414.00) 8)  Hyperlipidemia  (ICD-272.4) 9)  Hypertension, Benign Systemic  (ICD-401.1) 10)  Chronic Systolic Heart Failure  (ICD-428.22) 11)  Cardiomyopathy,  Ischemic  (ICD-414.8) 12)  Hypothyroidism, Unspecified  (ICD-244.9) 13)  COPD  (ICD-496)  Medications Prior to Update: 1)  Lantus 100 Unit/ml Soln (Insulin Glargine) .... Inject 75 Units Each Evening  Disp: Qs 1 Month 2)  Lipitor 80 Mg Tabs (Atorvastatin Calcium) .Marland Kitchen.. 1 Tab By Mouth Daily 3)  Carvedilol 12.5 Mg Tabs (Carvedilol) .... 1/2 By Mouth Two Times A Day 4)  Synthroid 100 Mcg Tabs (Levothyroxine Sodium) .Marland Kitchen.. 1 Tablet By Mouth Daily 5)  Digoxin 0.25 Mg Tabs (Digoxin) .Marland Kitchen.. 1 Tab By Mouth Daily 6)  Imdur 60 Mg Xr24h-Tab (Isosorbide Mononitrate) .Marland Kitchen.. 1 Tablet By Mouth Daily 7)  Bayer Aspirin Ec Low Dose 81 Mg Tbec (Aspirin) .Marland Kitchen.. 1 Tab By Mouth Daily 8)  Diovan 160 Mg Tabs (Valsartan) .Marland Kitchen.. 1 By Mouth Daily 9)  Warfarin Sodium 5 Mg Tabs (Warfarin Sodium) .... Take As Directed 10)  Oxygen .... Wear 2l/min Continuously 11)  Ventolin Hfa 108 (90 Base) Mcg/act Aers (Albuterol Sulfate) .Marland Kitchen.. 1-2 Puffs Inhaled Every 4 Hours As Needed For Shortness of Breath 12)  Nitrostat 0.4 Mg Subl (Nitroglycerin) .Marland Kitchen.. 1 Tab Under Tongue As Needed For Chest Pain 13)  Aldactone 25 Mg Tabs (Spironolactone) .... 1/2 By Mouth Daily 14)  Torsemide 20 Mg Tabs (Torsemide) .... 2 Tab By Mouth Two Times A Day For Fluid 15)  Acetaminophen 325 Mg  Tabs (Acetaminophen) .... As Needed 16)  Colace 100 Mg Caps (Docusate Sodium) .Marland Kitchen.. 1 By Mouth Daily 17)  Cardura 1 Mg Tabs (Doxazosin Mesylate) .Marland Kitchen.. 1 By Mouth Daily 18)  Lactulose 10 Gm/52ml Soln (Lactulose) .... As Needed 19)  Neomycin-Polymyxin-Gramicidin  Soln (Neomycin-Polymyx-Gramicid) .... Uad 20)  Miralax  Powd (Polyethylene Glycol 3350) .... As Needed 21)  Flomax 0.4 Mg Caps (Tamsulosin Hcl) .Marland Kitchen.. 1 By Mouth Daily 22)  Spiriva Handihaler 18 Mcg Caps (Tiotropium Bromide Monohydrate) .... Uad 23)  Vicodin 5-500 Mg Tabs (Hydrocodone-Acetaminophen) .... As Needed  Current Medications (verified): 1)  Lantus 100 Unit/ml Soln (Insulin Glargine) .... Inject 75 Units Each  Evening  Disp: Qs 1 Month 2)  Lipitor 80 Mg Tabs (Atorvastatin Calcium) .Marland Kitchen.. 1 Tab By Mouth Daily 3)  Carvedilol 12.5 Mg Tabs (Carvedilol) .... 1/2 By Mouth Two Times A Day 4)  Synthroid 100 Mcg Tabs (Levothyroxine Sodium) .Marland Kitchen.. 1 Tablet By Mouth Daily 5)  Digoxin 0.25 Mg Tabs (Digoxin) .Marland Kitchen.. 1 Tab By Mouth Daily 6)  Imdur 60 Mg Xr24h-Tab (Isosorbide Mononitrate) .Marland Kitchen.. 1 Tablet By Mouth Daily 7)  Bayer Aspirin Ec Low Dose 81 Mg Tbec (Aspirin) .Marland Kitchen.. 1 Tab By Mouth Daily 8)  Diovan 160 Mg Tabs (Valsartan) .Marland Kitchen.. 1 By Mouth Daily 9)  Warfarin Sodium 5 Mg Tabs (Warfarin Sodium) .... Take As Directed 10)  Oxygen .... Wear 2l/min Continuously 11)  Ventolin Hfa 108 (90 Base) Mcg/act Aers (Albuterol Sulfate) .Marland Kitchen.. 1-2 Puffs Inhaled Every 4 Hours As Needed For Shortness of Breath 12)  Nitrostat 0.4 Mg Subl (Nitroglycerin) .Marland Kitchen.. 1 Tab Under Tongue As Needed For Chest Pain 13)  Aldactone 25 Mg Tabs (Spironolactone) .... 1/2 By Mouth Daily 14)  Torsemide 20 Mg Tabs (Torsemide) .... 2 Tab By Mouth Two Times A Day For Fluid 15)  Acetaminophen 325 Mg  Tabs (Acetaminophen) .... As Needed 16)  Colace 100 Mg Caps (Docusate Sodium) .Marland Kitchen.. 1 By Mouth Daily 17)  Cardura 1 Mg Tabs (Doxazosin Mesylate) .Marland Kitchen.. 1 By Mouth Daily 18)  Lactulose 10 Gm/60ml Soln (Lactulose) .... As Needed 19)  Neomycin-Polymyxin-Gramicidin  Soln (Neomycin-Polymyx-Gramicid) .... Uad 20)  Miralax  Powd (Polyethylene Glycol 3350) .... As Needed 21)  Flomax 0.4 Mg Caps (Tamsulosin Hcl) .Marland Kitchen.. 1 By Mouth Daily 22)  Spiriva Handihaler 18 Mcg Caps (Tiotropium Bromide Monohydrate) .... Uad 23)  Vicodin 5-500 Mg Tabs (Hydrocodone-Acetaminophen) .... As Needed  Allergies (verified): 1)  Ace Inhibitors 2)  * Actos   Family History: Reviewed history from 01/21/2009 and no changes required.  Mother; he does not know her health problems.  Father   is unknown to the patient.  A sister had breast cancer.      Social History: Reviewed history from  08/13/2010 and no changes required. The patient is divorced.   He lives alone. 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   Drinks alcohol current smoker, began age 40, 1 pack per week.     Vital Signs:  Patient profile:   60 year old male O2 Sat:      99 % on 3 L/min Pulse rate:   77 / minute Pulse rhythm:   regular Resp:     19 per minute BP supine:   107 / 77  LABS: 128/5.6 90/23 99/2.65 648 (confirmed) sodium corrected: 137  11>13/37<266  anion gap 15  0.1 trop   Physical Exam  General:  c/m, no acute distress, seems lethargic, but could be baseline. Lungs:  Normal respiratory effort, chest expands symmetrically. Lungs are clear to auscultation, no crackles or wheezes. Heart:  Normal rate and regular rhythm. S1 and S2 normal without gallop, murmur, click, rub or other extra sounds. Abdomen:  Bowel sounds positive,abdomen soft and non-tender without masses, organomegaly or hernias noted. Extremities:  No clubbing, cyanosis, edema, or deformity noted with normal full range of motion of all joints.     Impression & Recommendations:  Problem # 1:  Acute Kidney Injury Assessment Deteriorated Patient has a worsened renal function than his discharge level. 1.7 to 2.3 we will gently hydrate him to rule out prerenal azotemia. we will make sure he is urinating to rule out post-renal obstruction (history of foley) We will get a FENA, U/A, microscopy to assess cause.  Problem # 2:  CHF EF 34%, currently not overloaded and lungs are clear. will continue home meds and watch for fluid overload.  Problem # 3:  DIABETES MELLITUS, TYPE II, UNCONTROLLED, WITH COMPLICATIONS (ICD-250.92) We will continue his lantus dosing and IVF. He is hyperglycemic to 643, but he has not been taking his Lantus. Anion gap is only 15.  His updated medication list for this problem includes:    Lantus 100 Unit/ml Soln (Insulin glargine) ..... Inject 75 units each evening  disp: qs 1 month    Bayer Aspirin  Ec Low Dose 81 Mg Tbec (Aspirin) .Marland Kitchen... 1 tab by mouth daily    Diovan 160 Mg Tabs (Valsartan) .Marland Kitchen... 1 by mouth daily  Problem # 4:  COPD (ICD-496) He is still smoking. advised him to quit again. We will give him a 14 mg patch. and get smoking cessation counselling for him. he has clear lungs at this time.  His updated medication list for this problem includes:    Ventolin Hfa 108 (90 Base) Mcg/act Aers (Albuterol sulfate) .Marland Kitchen... 1-2 puffs inhaled every 4 hours as needed for shortness of breath    Spiriva Handihaler 18 Mcg Caps (Tiotropium bromide monohydrate) ..... Uad  Problem # 5:  FENGI renal diet, NS0.9% @ 100cc/hr  Problem # 6:  Dispo Likely home tomorrow pending his improvement   Complete Medication List: 1)  Lantus 100 Unit/ml Soln (Insulin glargine) .... Inject 75 units each evening  disp: qs 1 month 2)  Lipitor 80 Mg Tabs (Atorvastatin calcium) .Marland Kitchen.. 1 tab by mouth daily 3)  Carvedilol 12.5 Mg Tabs (Carvedilol) .... 1/2 by mouth two times a day 4)  Synthroid 100 Mcg Tabs (Levothyroxine sodium) .Marland Kitchen.. 1 tablet by mouth daily 5)  Digoxin 0.25 Mg Tabs (Digoxin) .Marland Kitchen.. 1 tab by mouth daily 6)  Imdur 60 Mg Xr24h-tab (Isosorbide mononitrate) .Marland Kitchen.. 1 tablet by mouth daily 7)  Bayer Aspirin Ec Low Dose 81 Mg Tbec (Aspirin) .Marland Kitchen.. 1 tab by mouth daily 8)  Diovan 160 Mg Tabs (Valsartan) .Marland Kitchen.. 1 by mouth daily 9)  Warfarin Sodium 5 Mg Tabs (Warfarin sodium) .... Take as directed 10)  Oxygen  .... Wear 2l/min continuously 11)  Ventolin Hfa 108 (90 Base) Mcg/act Aers (Albuterol sulfate) .Marland Kitchen.. 1-2 puffs inhaled every 4 hours as needed for shortness of breath 12)  Nitrostat 0.4 Mg Subl (Nitroglycerin) .Marland Kitchen.. 1 tab under tongue as needed for chest pain 13)  Aldactone 25 Mg Tabs (Spironolactone) .... 1/2 by mouth daily 14)  Torsemide 20 Mg Tabs (Torsemide) .... 2 tab by mouth two times a day for fluid 15)  Acetaminophen 325 Mg Tabs (Acetaminophen) .... As needed 16)  Colace 100 Mg Caps (Docusate sodium)  .Marland Kitchen.. 1 by mouth daily 17)  Cardura 1 Mg Tabs (Doxazosin mesylate) .Marland Kitchen.. 1 by mouth daily 18)  Lactulose 10 Gm/63ml Soln (Lactulose) .... As needed 19)  Neomycin-polymyxin-gramicidin Soln (Neomycin-polymyx-gramicid) .... Uad 20)  Miralax Powd (Polyethylene glycol 3350) .... As needed 21)  Flomax 0.4 Mg Caps (Tamsulosin hcl) .Marland KitchenMarland KitchenMarland Kitchen  1 by mouth daily 22)  Spiriva Handihaler 18 Mcg Caps (Tiotropium bromide monohydrate) .... Uad 23)  Vicodin 5-500 Mg Tabs (Hydrocodone-acetaminophen) .... As needed   Appended Document: R3 addendum    Clinical Lists Changes  Problems: Added new problem of RENAL FAILURE, ACUTE (ICD-584.9) Assessed RENAL FAILURE, ACUTE as comment only - Today 2.65, was 1.1 indischarge dec 9. Assessed CHEST PAIN UNSPECIFIED as comment only - will cycle cardiac enzyme, tropinin .1 less than previous admission, thought to be due to heart strain and poor renal clearance Assessed DIABETES MELLITUS, TYPE II, UNCONTROLLED, WITH COMPLICATIONS as comment only - patient did not take his lantus this AM.  he states he tkes 30 units qam.  Will restart lower than home dose and titrate as needed during hospitalization.  His updated medication list for this problem includes:    Lantus 100 Unit/ml Soln (Insulin glargine) ..... Inject 75 units each evening  disp: qs 1 month    Bayer Aspirin Ec Low Dose 81 Mg Tbec (Aspirin) .Marland Kitchen... 1 tab by mouth daily    Diovan 160 Mg Tabs (Valsartan) .Marland Kitchen... 1 by mouth daily  Assessed HYPERTENSION, BENIGN SYSTEMIC as comment only -  His updated medication list for this problem includes:    Carvedilol 12.5 Mg Tabs (Carvedilol) .Marland Kitchen... 1/2 by mouth two times a day    Diovan 160 Mg Tabs (Valsartan) .Marland Kitchen... 1 by mouth daily    Aldactone 25 Mg Tabs (Spironolactone) .Marland Kitchen... 1/2 by mouth daily    Torsemide 20 Mg Tabs (Torsemide) .Marland Kitchen... 2 tab by mouth two times a day for fluid    Cardura 1 Mg Tabs (Doxazosin mesylate) .Marland Kitchen... 1 by mouth daily  Assessed DEEP VENOUS THROMBOPHLEBITIS,  LEG, RIGHT as comment only - check INR,  Continue coumadin Assessed CHRONIC SYSTOLIC HEART FAILURE as comment only - continue digoxin,   Assessed OBSTRUCTIVE SLEEP APNEA as comment only - continue O2 by nasal cannula.  Patient in process of fitting new CPAP as outpatient with pulmonology. Observations: Added new observation of PAST MED HX: -CARDIOMYOPATHY, ISCHEMIC (ICD-414.8) (EF 42%) -CAD (ICD-414.00). Stent placement Right coronary artery 2007. Stenting LAD and cutting balloon of the diagonal (history of an inferior myocardial infarction.  Stent placement to the right coronary artery in May 2007.  He also has stenting to his LAD and a cutting balloon of the diagonal.  The most recent catheterization in June 2008 demonstrated distal obstructive LAD disease, but otherwise no acute disease.) 1. History of coronary artery disease with last catheterization in     January 2008 showing an ejection fraction of 35%, diagonal 40%,     distal left anterior descending artery 95% (2-mm vessel),     circumflex 70% and no in-stent restenosis in the right coronary     artery, medical therapy was recommended. 2. Ischemic cardiomyopathy. 3. Ongoing tobacco abuse.      - PFT's June 17, 2010 >> no airflow obstruction 4. Hypertension. 5. Hyperlipidemia. 6. Hypothyroidism. 7. Obstructive sleep apnea.       - Sleep study 06/23/10>> AHI 54/hr with desat to 74% 8. History of deep venous thrombosis, on chronic Coumadin. 9. Allergy or intolerance to ACE INHIBITORS with cough and PIOGLITAZONE. 10.History of stent to the right coronary artery and staged stent to     the left anterior descending artery with percutaneous coronary     intervention of the first diagonal branch in May 2002 secondary to     non-ST segment elevation myocardial infarction. 11.Obesity. 12.History of spinal fusion. 13.Anxiety.  14.History of Fournier gangrene, status post surgical debridement x2     in 2006. 15.History of diabetic  nephropathy. 16.Osteoarthritis. 17. Chronic respiratory failure       - CTangiogram Chest 05/21/2010 bilateral effusion, no PE       - No desat walking on 2lpm June 17, 2010  18.  Systolic CHF Left ventricle: Echo 08/2010:  The estimated EF  20% to 25%.  19. history of PE on coumadin (09/11/2010 17:03) Added new observation of PE COMMENTS: BMET: 128/5.6 90/23 99/2.65 648  CBC: WBC 11 HGB13 HCT 37 PLT266  0.1 trop CK 76   CXR: Potential increase in right hilar adenopathy compared to prior.   The patient has chronic adenopathy but this may be worsening.   Consider sarcoidosis or lymphoproliferative disorder.  D-Dimer 0.6 (09/11/2010 17:03) Added new observation of PEDAL PULSE: DP, PT pulses 2+ bilaterally (09/11/2010 17:03) Added new observation of EXTREMITIES: no LE edema (09/11/2010 17:03) Added new observation of NEURO EXAM: alert & oriented X3 and cranial nerves II-XII intact.  nonfocal (09/11/2010 17:03) Added new observation of ABDOMEN EXAM: Bowel sounds positive,abdomen soft and mildy tender in pelvis without masses, organomegaly or hernias noted. (09/11/2010 17:03) Added new observation of NECK EXAM: No deformities, masses, or tenderness noted. (09/11/2010 17:03) Added new observation of HEART EXAM: Normal rate and regular rhythm. S1 and S2 normal without gallop, murmur, click, rub or other extra sounds. (09/11/2010 17:03) Added new observation of LUNG EXAM: Normal respiratory effort, chest expands symmetrically. Lungs are clear to auscultation, no crackles or wheezes. (09/11/2010 17:03) Added new observation of ORAL EXAM: Teeth, gums and palate normal. Oral mucosa normal, moist (09/11/2010 17:03) Added new observation of EYE EXAM: PERRLA/EOM intact (09/11/2010 17:03) Added new observation of HD/FACE INSP: normocephalic and atraumatic (09/11/2010 17:03) Added new observation of PEADULT: Delbert Harness MD ~General`Gen appear ~Head`hd/face insp ~Eyes`Eye exam ~Mouth`Oral exam  ~Lungs`lung exam ~Heart`Heart exam ~Neck`NECK EXAM ~Abdomen`Abdomen exam ~Neurologic`Neuro exam ~Extremities`Extremities ~Pulses`pedal pulse ~Additional Exam`PE comments (09/11/2010 17:03) Added new observation of GEN APPEAR: NAD, alert and oriented x 4 (09/11/2010 17:03) Added new observation of ROS: NEURO: Denies memory loss, numbness, tingling (09/11/2010 17:03) Added new observation of ROS: GU: Denies dysuria, hematuria (09/11/2010 17:03) Added new observation of ROS: GI: Complains of abdominal pain; Denies constipation, diarrhea, nausea, vomiting (09/11/2010 17:03) Added new observation of ZOX:WRUEAV: Complains of chest discomfort, shortness of breath; Denies cough (09/11/2010 17:03) Added new observation of ROS: CARDIAC: Complains of chest pain or discomfort, fatigue, shortness of breath with exertion; Denies fainting, lightheadness, near fainting, swelling of feet (09/11/2010 17:03) Added new observation of WUJ:WJXBJYN: Complains of chills; Denies fever, weakness (09/11/2010 17:03) Added new observation of SOCIAL HX: The patient is divorced.   He lives alone. 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   He has not spoken to his on ly daughter since age 77 His sister is his preferred advocate FULL CODE. Drinks alcohol current smoker, began age 63, 1 pack per week.  (09/11/2010 17:03) Added new observation of HPI: 60 yo with several day history of increasing fatigue with 2 days history of intermittant left sided chest pain at rest and with activity, chronic for this patient, with last episodes starting 2 days ago.  Described as nonradiating pressure.  No dyspnea or nausea with the episodes but has dyspnea at baseline.  Wera 2 L o2 chornically.    Yesterday had MUGA scan with EF 34% Also had urology appointment yesterday where they removed his foley catheter from prior hospitalization.  He states he has had good fluid intake and has voided multiple times since foley was removed yesterday.  Admission for  hyperglycemia and acute renal failure.  Patient's oxygenation is at baseline. (09/11/2010 17:03) Added new observation of REFERRING MD: Sandrea Hughs MD (09/11/2010 17:03) Added new observation of PRIMARY MD: Cat Ta MD (09/11/2010 17:03)       Referred by:  Sandrea Hughs MD PCP:  [name] MD   History of Present Illness: 60 yo with several day history of increasing fatigue with 2 days history of intermittant left sided chest pain at rest and with activity, chronic for this patient, with last episodes starting 2 days ago.  Described as nonradiating pressure.  No dyspnea or nausea with the episodes but has dyspnea at baseline.  Wera 2 L o2 chornically.    Yesterday had MUGA scan with EF 34% Also had urology appointment yesterday where they removed his foley catheter from prior hospitalization.  He states he has had good fluid intake and has voided multiple times since foley was removed yesterday.  Admission for hyperglycemia and acute renal failure.  Patient's oxygenation is at baseline.  Past History:  Past Surgical History: Last updated: 09/04/2010 Lumbar spinal fusion - about 1987 - 09/27/1985,  Tonsillectomy - as a child - 09/27/1954  Family History: Last updated: 01/21/2009  Mother; he does not know her health problems.  Father   is unknown to the patient.  A sister had breast cancer.      Social History: Last updated: 09/11/2010 The patient is divorced.   He lives alone. 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   He has not spoken to his on ly daughter since age 13 His sister is his preferred advocate FULL CODE. Drinks alcohol current smoker, began age 68, 1 pack per week.   Past Medical History: -CARDIOMYOPATHY, ISCHEMIC (ICD-414.8) (EF 42%) -CAD (ICD-414.00). Stent placement Right coronary artery 2007. Stenting LAD and cutting balloon of the diagonal (history of an inferior myocardial infarction.  Stent placement to the right coronary artery in May 2007.  He also has stenting to his  LAD and a cutting balloon of the diagonal.  The most recent catheterization in June 2008 demonstrated distal obstructive LAD disease, but otherwise no acute disease.) 1. History of coronary artery disease with last catheterization in     January 2008 showing an ejection fraction of 35%, diagonal 40%,     distal left anterior descending artery 95% (2-mm vessel),     circumflex 70% and no in-stent restenosis in the right coronary     artery, medical therapy was recommended. 2. Ischemic cardiomyopathy. 3. Ongoing tobacco abuse.      - PFT's June 17, 2010 >> no airflow obstruction 4. Hypertension. 5. Hyperlipidemia. 6. Hypothyroidism. 7. Obstructive sleep apnea.       - Sleep study 06/23/10>> AHI 54/hr with desat to 74% 8. History of deep venous thrombosis, on chronic Coumadin. 9. Allergy or intolerance to ACE INHIBITORS with cough and PIOGLITAZONE. 10.History of stent to the right coronary artery and staged stent to     the left anterior descending artery with percutaneous coronary     intervention of the first diagonal branch in May 2002 secondary to     non-ST segment elevation myocardial infarction. 11.Obesity. 12.History of spinal fusion. 13.Anxiety. 14.History of Fournier gangrene, status post surgical debridement x2     in 2006. 15.History of diabetic nephropathy. 16.Osteoarthritis. 17. Chronic respiratory failure       - CTangiogram  Chest 05/21/2010 bilateral effusion, no PE       - No desat walking on 2lpm June 17, 2010  18.  Systolic CHF Left ventricle: Echo 08/2010:  The estimated EF  20% to 25%.  19. history of PE on coumadin   Social History: The patient is divorced.   He lives alone. 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   He has not spoken to his on ly daughter since age 56 His sister is his preferred advocate FULL CODE. Drinks alcohol current smoker, began age 15, 1 pack per week.    Review of Systems      See HPI General:  Complains of chills; denies fever  and weakness. CV:  Complains of chest pain or discomfort, fatigue, and shortness of breath with exertion; denies fainting, lightheadness, near fainting, and swelling of feet. Resp:  Complains of chest discomfort and shortness of breath; denies cough. GI:  Complains of abdominal pain; denies constipation, diarrhea, nausea, and vomiting. GU:  Denies dysuria and hematuria. Neuro:  Denies memory loss, numbness, and tingling.    Physical Exam  General:  NAD, alert and oriented x 4 Head:  normocephalic and atraumatic Eyes:  PERRLA/EOM intact Mouth:  Teeth, gums and palate normal. Oral mucosa normal, moist Neck:  No deformities, masses, or tenderness noted. Lungs:  Normal respiratory effort, chest expands symmetrically. Lungs are clear to auscultation, no crackles or wheezes. Heart:  Normal rate and regular rhythm. S1 and S2 normal without gallop, murmur, click, rub or other extra sounds. Abdomen:  Bowel sounds positive,abdomen soft and mildy tender in pelvis without masses, organomegaly or hernias noted. Pulses:  DP, PT pulses 2+ bilaterally Extremities:  no LE edema Neurologic:  alert & oriented X3 and cranial nerves II-XII intact.  nonfocal Additional Exam:  BMET: 128/5.6 90/23 99/2.65 648  CBC: WBC 11 HGB13 HCT 37 PLT266  0.1 trop CK 76   CXR: Potential increase in right hilar adenopathy compared to prior.   The patient has chronic adenopathy but this may be worsening.   Consider sarcoidosis or lymphoproliferative disorder.  D-Dimer 0.6   Impression & Recommendations:  Problem # 1:  RENAL FAILURE, ACUTE (ICD-584.9) Today 2.65, was 1.1 on discharge dec 9.  Labs suggest pre-renal etiology, no clear history of fluid loss but perhaps increase in diuretics on last discharge vs post-obs diuresis vs obstruction although patient has a good history of urine output since foley removal.  Strict i/o's, hold diuretics, MIVF, consider i/o cath if creatinine not improving or if poor uop.     Problem # 2:  CHEST PAIN UNSPECIFIED (ICD-786.50) will cycle cardiac enzyme, tropinin .1 less than previous admission, thought to be due to heart strain and poor renal clearance  Problem # 3:  DIABETES MELLITUS, TYPE II, UNCONTROLLED, WITH COMPLICATIONS (ICD-250.92) patient did not take his lantus this AM.  he states he tkes 30 units qam.  Will restart lower than home dose and titrate as needed during hospitalization.  His updated medication list for this problem includes:    Lantus 100 Unit/ml Soln (Insulin glargine) ..... Inject 75 units each evening  disp: qs 1 month    Bayer Aspirin Ec Low Dose 81 Mg Tbec (Aspirin) .Marland Kitchen... 1 tab by mouth daily    Diovan 160 Mg Tabs (Valsartan) .Marland Kitchen... 1 by mouth daily  Problem # 4:  HYPERTENSION, BENIGN SYSTEMIC (ICD-401.1) BP at baseline around 110's.  hold aldactone, torsemide, as below until renal function improves  His updated medication list for this  problem includes:    Carvedilol 12.5 Mg Tabs (Carvedilol) .Marland Kitchen... 1/2 by mouth two times a day    Diovan 160 Mg Tabs (Valsartan) .Marland Kitchen... 1 by mouth daily    Aldactone 25 Mg Tabs (Spironolactone) .Marland Kitchen... 1/2 by mouth daily    Torsemide 20 Mg Tabs (Torsemide) .Marland Kitchen... 2 tab by mouth two times a day for fluid    Cardura 1 Mg Tabs (Doxazosin mesylate) .Marland Kitchen... 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  Patient in process of fitting new CPAP as outpatient with pulmonology.  Problem # 8:  Prophylaxis Check INR, no need if therapeutic.  Problem # 9:  Dispo:   PT/OT, patient may be candidate for SNF, FULL CODE.  Complete Medication List: 1)  Lantus 100 Unit/ml Soln (Insulin glargine) .... Inject 75 units each evening  disp: qs 1 month 2)  Lipitor 80 Mg Tabs (Atorvastatin calcium)  .Marland Kitchen.. 1 tab by mouth daily 3)  Carvedilol 12.5 Mg Tabs (Carvedilol) .... 1/2 by mouth two times a day 4)  Synthroid 100 Mcg Tabs (Levothyroxine sodium) .Marland Kitchen.. 1 tablet by mouth daily 5)  Digoxin 0.25 Mg Tabs (Digoxin) .Marland Kitchen.. 1 tab by mouth daily 6)  Imdur 60 Mg Xr24h-tab (Isosorbide mononitrate) .Marland Kitchen.. 1 tablet by mouth daily 7)  Bayer Aspirin Ec Low Dose 81 Mg Tbec (Aspirin) .Marland Kitchen.. 1 tab by mouth daily 8)  Diovan 160 Mg Tabs (Valsartan) .Marland Kitchen.. 1 by mouth daily 9)  Warfarin Sodium 5 Mg Tabs (Warfarin sodium) .... Take as directed 10)  Oxygen  .... Wear 2l/min continuously 11)  Ventolin Hfa 108 (90 Base) Mcg/act Aers (Albuterol sulfate) .Marland Kitchen.. 1-2 puffs inhaled every 4 hours as needed for shortness of breath 12)  Nitrostat 0.4 Mg Subl (Nitroglycerin) .Marland Kitchen.. 1 tab under tongue as needed for chest pain 13)  Aldactone 25 Mg Tabs (Spironolactone) .... 1/2 by mouth daily 14)  Torsemide 20 Mg Tabs (Torsemide) .... 2 tab by mouth two times a day for fluid 15)  Acetaminophen 325 Mg Tabs (Acetaminophen) .... As needed 16)  Colace 100 Mg Caps (Docusate sodium) .Marland Kitchen.. 1 by mouth daily 17)  Cardura 1 Mg Tabs (Doxazosin mesylate) .Marland Kitchen.. 1 by mouth daily 18)  Lactulose 10 Gm/85ml Soln (Lactulose) .... As needed 19)  Neomycin-polymyxin-gramicidin Soln (Neomycin-polymyx-gramicid) .... Uad 20)  Miralax Powd (Polyethylene glycol 3350) .... As needed 21)  Flomax 0.4 Mg Caps (Tamsulosin hcl) .Marland Kitchen.. 1 by mouth daily 22)  Spiriva Handihaler 18 Mcg Caps (Tiotropium bromide monohydrate) .... Uad 23)  Vicodin 5-500 Mg Tabs (Hydrocodone-acetaminophen) .... As needed

## 2010-10-29 NOTE — Progress Notes (Signed)
Summary: Checking on pt since he missed appt  Phone Note Outgoing Call   Call placed by: Brylei Pedley MD,  October 02, 2010 11:55 AM Call placed to: Patient Summary of Call: Pt missed appt with me today.  I called to check on him.  No answer so I left message that I hope he is doing well.  Encouraged him to make appt with me so that I can check on his fluid status with Torsemide and also check his INR.   Initial call taken by: Franklin Clapsaddle MD,  October 02, 2010 11:57 AM

## 2010-10-29 NOTE — Progress Notes (Signed)
Summary: New dose Lantus   Phone Note Outgoing Call   Call placed by: [name] MD,  October 22, 2010 8:44 AM Summary of Call: [name], pt's [facility] [phone].  I was not able to reach pt to change Lantus from 35 units to 40 units.   Asked her to relay message to pt that I would like him to make this change.  [name] will not see him until next week, which is ok to start Lantus 40 units then.  She will give message to his HHPT to ask pt to increase Lantus to 40 units.  I will send new Rx to his pharmacy.  I will also attempt to call his neighbor with same request.  ***Note: pt to have surgery per urology for urinary retention.  May need to go to Assisted Living Facility after that.  Gave verbal order to [name] to have SW look into that if pt needs it.  [name] agreed.  Initial call taken by: Kylyn Sookram MD,  October 22, 2010 8:49 AM  Follow-up for Phone Call        Called Neighbor: [name] [phone].  Phone disconnected. Follow-up by: Pang Robers MD,  October 22, 2010 8:51 AM    New/Updated Medications: LANTUS 100 UNIT/ML SOLN (INSULIN GLARGINE) inject 40 units each evening  disp: QS 1 month. Prescriptions: LANTUS 100 UNIT/ML SOLN (INSULIN GLARGINE) inject 40 units each evening  disp: QS 1 month.  #1 x 3   Entered and Authorized by:   [name] MD   Signed by:   [name] MD on 10/22/2010   Method used:   Electronically to        [facility] (retail)       [address]       [place], [place]  [address]       Ph: [phone]       Fax: [phone]   RxID:   [id number]

## 2010-10-29 NOTE — Progress Notes (Signed)
Summary: Cardiology Phone Note - SOB/Orthopnea  Phone Note Call from Patient   Caller: [name] Summary of Call: Received call from [facility] from [facility] stating that he saw Mr. [name] today and pt. has c/o increasing LEE as well as orthopnea.  He has been out of his Digoxin and Diovan x 3 days and will not receive a refill through the mail until Tuesday (He says it won't help if I call it into a local pharmacy b/c he doesn't have the money to pay for it).  His current Torsemide dose is 20mg  daily, recently decreased from 40 two times a day after hospitalization for renal failure and dehydration.  I rec. that he increase Torsemide to 20mg  two times a day for the next 4 days.  If his Ss do not improve during that time, or worsen than he is to call us back.  Further, I'd like for him to be seen in clinic by Tereso Newcomer later next week and I will see if I can arrange that and we will be back in touch with him.  He will need a BMET when we see him back.   Josh, [facility] verbalized understanding and discussed it with Mr. [name] while I was on the phone.  Pt will call back if he is not improving. Initial call taken by: Creig Hines, ANP-BC,  September 26, 2010 11:55 AM     Appended Document: Cardiology Phone Note - SOB/Orthopnea Notes indicate this patient is to see me this week.  I do not see him on my schedule. Please make sure he gets appt.  Appended Document: Cardiology Phone Note - SOB/Orthopnea lmtcb./cy  Appended Document: Cardiology Phone Note - SOB/Orthopnea we have attempted several times to reach pt to not avail.  Per Dr Antoine Poche pt is in the hospital

## 2010-10-29 NOTE — Assessment & Plan Note (Signed)
Summary: Cardiology Rest MugaTesting    Muga Study  Indication: Assess LVF.Recent Echo EF=20-25%. Recent hospialization CHF, slight increase troponin. 5/07 IWMI>Stent RCA,LAD. 8/09 Cath: Multi-vessel disease, EF=20-25%. Hx. ICM,CHF,LBBB. Hx. Smoker,HTN,Lipids. IV 20G (R) wrist. Patsy Edwards,RN.  Muga Information: The patient's red blood cells were labeled using the Ultra Tag method with 33.0 mci of Technetium-23m Pertechnetate.  The images were reconstructed in the Anterior, Lateral and Left Anterior Oblique Views.  Impression: The LV systolic function is moderately depressed with and EF of 34%

## 2010-10-29 NOTE — Letter (Signed)
Summary: Generic Letter  Architectural technologist, Main Office  [address]   [place], [place] [address]   Phone: [phone]  Fax: [phone]        September 29, 2010 MRN: [id number]    [facility] [address] [place], [place]  [address]    Dear [name],   We have been trying to reach you in regards to an appointment. Your phone number [phone] is stating number is disconnected. Please call the office to set up an appointment to see Dr.Hochrein PA Tereso Newcomer.        Sincerely,  Lorne Skeens  This letter has been electronically signed by your physician.

## 2010-10-29 NOTE — Progress Notes (Signed)
Summary: chest discomfort last night  lm to cb  Phone Note From Other Clinic Call back at [phone]   Caller: Cheral Almas physical therapist Request: Talk with Nurse, Talk with Provider Summary of Call: pt was seen this morning b/p was 80/50 sitting, at rest pulse was 74 resp where 17 and this was about 9am this morning pt is complaining of chest pressure and arm pain last night and he took nitro and all symptoms except chest pressure went away. Per Olegario Messier pt is hard to get in contact with because he dosen't answer his phone but she still tried to f/u today but got no answer. Initial call taken by: Omer Jack,  September 23, 2010 3:11 PM  Follow-up for Phone Call        attempted to contact pt at the home number listed.  left message to call back if having s/s.  Sander Nephew, RN 09/23/10

## 2010-10-29 NOTE — Progress Notes (Signed)
Summary: Checking on pt.   Phone Note Outgoing Call   Call placed by: [name] MD,  September 29, 2010 10:22 AM Call placed to: Patient Summary of Call: Called to check on pt.  Phone is disconnected.  I called  [phone], which was in previous notes, but it was the number for pt's PT, [employer].  She is not scheduled to see pt until tomorrow.  She gave me a number to try [phone].   Called pt at that number and was able to speak to pt.  He states that he was feeling better today.  CBGs have been 160s-190s.  I asked pt to see me on Fri since I am in clinic.  He agreed.  Will sched appt for him at 9:30 for 10/02/10.    Initial call taken by: Sonia Bromell MD,  September 29, 2010 10:28 AM     Appended Document: Checking on pt.      Social History: The patient is divorced.   He lives alone. 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   He has not spoken to his on ly daughter since age 51 His sister is his preferred advocate FULL CODE. Drinks alcohol current smoker, began age 89, 1 pack per week.   Neighbor: Brandon Melnick [phone]

## 2010-11-03 ENCOUNTER — Encounter (HOSPITAL_COMMUNITY): Payer: Medicare Other

## 2010-11-03 LAB — BASIC METABOLIC PANEL
BUN: 36 mg/dL — ABNORMAL HIGH (ref 6–23)
CO2: 25 mEq/L (ref 19–32)
Calcium: 9 mg/dL (ref 8.4–10.5)
Chloride: 106 mEq/L (ref 96–112)
Creatinine, Ser: 1.3 mg/dL (ref 0.4–1.5)
GFR calc Af Amer: >60 mL/min (ref >60)
GFR calc non Af Amer: 56 mL/min — ABNORMAL LOW (ref >60)
Glucose, Bld: 336 mg/dL — ABNORMAL HIGH (ref 70–99)
Potassium: 4.5 mEq/L (ref 3.5–5.1)
Sodium: 139 mEq/L (ref 135–145)

## 2010-11-03 LAB — CBC
HCT: 33.9 % — ABNORMAL LOW (ref 39.0–52.0)
Hemoglobin: 11.3 g/dL — ABNORMAL LOW (ref 13.0–17.0)
MCH: 28.6 pg (ref 26.0–34.0)
MCHC: 33.3 g/dL (ref 30.0–36.0)
MCV: 85.8 fL (ref 78.0–100.0)
Platelets: 246 10*3/uL (ref 150–400)
RBC: 3.95 MIL/uL — ABNORMAL LOW (ref 4.22–5.81)
RDW: 14.5 % (ref 11.5–15.5)
WBC: 13.1 10*3/uL — ABNORMAL HIGH (ref 4.0–10.5)

## 2010-11-03 LAB — SURGICAL PCR SCREEN
MRSA, PCR: NEGATIVE
Staphylococcus aureus: POSITIVE — AB

## 2010-11-04 NOTE — Progress Notes (Signed)
Summary: Nyoka Lint with [facility]  Phone Note Other Incoming   Caller: Nyoka Lint with [facility] [phone] Summary of Call: Wanting to recertify Mr. Loden for once a week visits x 5 more weeks for CHF and DM management.  States that his mother just passed away. Social Worker is now involved.  Discussing maybe assisted living.  They are really trying to make sure Mr. Marchetta is taking his medication and trying to keep him out of the hospital.  Verbal okay given for recertification. Initial call taken by: Terese Door,  October 28, 2010 10:38 AM  Follow-up for Phone Call        Called Nyoka Lint back.  Gave her verbal order for [facility] once a week x 5 weeks.  Also working on Assisted living for pt.  Follow-up by: [name] MD,  October 28, 2010 12:29 PM

## 2010-11-04 NOTE — Consult Note (Signed)
Summary: [facility]   [facility]   Imported By: Roderic Ovens 10/29/2010 15:06:21  _____________________________________________________________________  External Attachment:    Type:   Image     Comment:   External Document

## 2010-11-05 ENCOUNTER — Other Ambulatory Visit: Payer: Self-pay | Admitting: Family Medicine

## 2010-11-05 LAB — URINE CULTURE
Colony Count: >=100000
Culture  Setup Time: 201202071616
Special Requests: NEGATIVE

## 2010-11-05 MED ORDER — TAMSULOSIN HCL 0.4 MG PO CAPS: 0.8000 mg | ORAL_CAPSULE | Freq: Every day | ORAL | Status: DC

## 2010-11-06 ENCOUNTER — Ambulatory Visit (HOSPITAL_COMMUNITY)
Admission: RE | Admit: 2010-11-06 | Discharge: 2010-11-08 | Disposition: A | Discharge status: Home / Self Care | Payer: Medicare Other | Attending: Urology | Admitting: Urology

## 2010-11-06 DIAGNOSIS — E1129 Type 2 diabetes mellitus with other diabetic kidney complication: Secondary | ICD-10-CM | POA: Insufficient documentation

## 2010-11-06 DIAGNOSIS — F3289 Other specified depressive episodes: Secondary | ICD-10-CM | POA: Insufficient documentation

## 2010-11-06 DIAGNOSIS — N138 Other obstructive and reflux uropathy: Secondary | ICD-10-CM | POA: Insufficient documentation

## 2010-11-06 DIAGNOSIS — R339 Retention of urine, unspecified: Secondary | ICD-10-CM | POA: Insufficient documentation

## 2010-11-06 DIAGNOSIS — Z9981 Dependence on supplemental oxygen: Secondary | ICD-10-CM | POA: Insufficient documentation

## 2010-11-06 DIAGNOSIS — N058 Unspecified nephritic syndrome with other morphologic changes: Secondary | ICD-10-CM | POA: Insufficient documentation

## 2010-11-06 DIAGNOSIS — Z01812 Encounter for preprocedural laboratory examination: Secondary | ICD-10-CM | POA: Insufficient documentation

## 2010-11-06 DIAGNOSIS — I10 Essential (primary) hypertension: Secondary | ICD-10-CM | POA: Insufficient documentation

## 2010-11-06 DIAGNOSIS — N210 Calculus in bladder: Secondary | ICD-10-CM | POA: Insufficient documentation

## 2010-11-06 DIAGNOSIS — N401 Enlarged prostate with lower urinary tract symptoms: Secondary | ICD-10-CM | POA: Insufficient documentation

## 2010-11-06 DIAGNOSIS — G4733 Obstructive sleep apnea (adult) (pediatric): Secondary | ICD-10-CM | POA: Insufficient documentation

## 2010-11-06 DIAGNOSIS — Z803 Family history of malignant neoplasm of breast: Secondary | ICD-10-CM | POA: Insufficient documentation

## 2010-11-06 DIAGNOSIS — E039 Hypothyroidism, unspecified: Secondary | ICD-10-CM | POA: Insufficient documentation

## 2010-11-06 DIAGNOSIS — F17200 Nicotine dependence, unspecified, uncomplicated: Secondary | ICD-10-CM | POA: Insufficient documentation

## 2010-11-06 DIAGNOSIS — E785 Hyperlipidemia, unspecified: Secondary | ICD-10-CM | POA: Insufficient documentation

## 2010-11-06 DIAGNOSIS — F329 Major depressive disorder, single episode, unspecified: Secondary | ICD-10-CM | POA: Insufficient documentation

## 2010-11-06 DIAGNOSIS — I2510 Atherosclerotic heart disease of native coronary artery without angina pectoris: Secondary | ICD-10-CM | POA: Insufficient documentation

## 2010-11-06 DIAGNOSIS — Z01810 Encounter for preprocedural cardiovascular examination: Secondary | ICD-10-CM | POA: Insufficient documentation

## 2010-11-06 DIAGNOSIS — I509 Heart failure, unspecified: Secondary | ICD-10-CM | POA: Insufficient documentation

## 2010-11-06 LAB — GLUCOSE, CAPILLARY
Glucose-Capillary: 146 mg/dL — ABNORMAL HIGH (ref 70–99)
Glucose-Capillary: 149 mg/dL — ABNORMAL HIGH (ref 70–99)
Glucose-Capillary: 145 mg/dL — ABNORMAL HIGH (ref 70–99)
Glucose-Capillary: 199 mg/dL — ABNORMAL HIGH (ref 70–99)
Glucose-Capillary: 207 mg/dL — ABNORMAL HIGH (ref 70–99)

## 2010-11-06 LAB — APTT: aPTT: 32 seconds (ref 24–37)

## 2010-11-06 LAB — PROTIME-INR
INR: 1.15 (ref 0.00–1.49)
Prothrombin Time: 14.9 seconds (ref 11.6–15.2)

## 2010-11-07 LAB — GLUCOSE, CAPILLARY
Glucose-Capillary: 118 mg/dL — ABNORMAL HIGH (ref 70–99)
Glucose-Capillary: 178 mg/dL — ABNORMAL HIGH (ref 70–99)
Glucose-Capillary: 100 mg/dL — ABNORMAL HIGH (ref 70–99)
Glucose-Capillary: 138 mg/dL — ABNORMAL HIGH (ref 70–99)

## 2010-11-09 LAB — GLUCOSE, CAPILLARY: Glucose-Capillary: 121 mg/dL — ABNORMAL HIGH (ref 70–99)

## 2010-11-10 ENCOUNTER — Telehealth: Payer: Self-pay | Admitting: Family Medicine

## 2010-11-10 NOTE — Telephone Encounter (Signed)
Cindy call to advise [name] had an outpatient procedure on Friday 11/06/10 on his urethra.  He was admitted to [facility] over the weekend and discharged on Sunday.  Needs continuation orders to resume his home health care for CHF and Diabetes.  Also wants Dr. Janalyn Harder to know he is not taking his Coumadin or ASA.  [name] is to follow up with urology in 7 to 10 days.  He is having a Dubray bleeding from his urethra but it is getting clearer.  Verbal orders given to resume home health.  Also advise to have patient call urologist office to see when they want him to restart his Coumadin and ASA.

## 2010-11-11 ENCOUNTER — Encounter: Payer: Self-pay | Admitting: Family Medicine

## 2010-11-11 DIAGNOSIS — I82409 Acute embolism and thrombosis of unspecified deep veins of unspecified lower extremity: Secondary | ICD-10-CM

## 2010-11-11 DIAGNOSIS — Z7901 Long term (current) use of anticoagulants: Secondary | ICD-10-CM | POA: Insufficient documentation

## 2010-11-17 ENCOUNTER — Other Ambulatory Visit: Payer: Self-pay | Admitting: Family Medicine

## 2010-11-17 ENCOUNTER — Telehealth: Payer: Self-pay | Admitting: *Deleted

## 2010-11-17 MED ORDER — TORSEMIDE 20 MG PO TABS: 40.0000 mg | ORAL_TABLET | Freq: Every day | ORAL | Status: DC

## 2010-11-17 NOTE — Telephone Encounter (Signed)
[facility] RN called to report that due to the recent death of patient's mother he is experiencing depression, anxiety, decreased apppetite and difficulty sleeping.  She is wondering if Dr. Janalyn Harder would be willing to prescribe anything to help relax him.  He uses [facility] on [address].  Routing note to Dr. Janalyn Harder now.

## 2010-11-17 NOTE — Telephone Encounter (Signed)
Called Cindy back. Got her voicemail: I cannot prescribe meds without seeing pt in clinic.  I asked her to encouraged pt to make appt with me or another provider.

## 2010-11-17 NOTE — Telephone Encounter (Signed)
Please review and refill

## 2010-11-19 NOTE — Op Note (Signed)
NAME:  [name], [name]                 ACCOUNT NO.:  [id number]  MEDICAL RECORD NO.:  [id number]           PATIENT TYPE:  O  LOCATION:  DAYL                         FACILITY:  [facility]  PHYSICIAN:  Makani Seckman C. Vernie Ammons, M.D.  DATE OF BIRTH:  [birth year]  DATE OF PROCEDURE:  11/06/2010 DATE OF DISCHARGE:                              OPERATIVE REPORT   PREOPERATIVE DIAGNOSES: 1. Benign prostatic hypertrophy with acute urinary retention. 2. Bladder calculi.  POSTOPERATIVE DIAGNOSES: 1. Benign prostatic hypertrophy with acute urinary retention. 2. Bladder calculi.  PROCEDURE: 1. Gyrus transurethral resection of prostate. 2. Extraction of bladder calculi.  SURGEON:  Kimani Hovis C. Vernie Ammons, MD  ANESTHESIA:  Spinal.  SPECIMENS:  None.  DRAINS:  20-French three-way Foley catheter.  BLOOD LOSS:  Less than 5 cc.  COMPLICATIONS:  None.  INDICATIONS:  The patient is a 60 year old male who developed acute urinary retention.  He was found urodynamically to have obstruction as well as some degree of bladder hypertonicity.  We have discussed the treatment options.  He has elected to proceed with transurethral resection of his prostate to reduce his outlet resistance.  The risks, complications, alternatives and limitations have been discussed.  He understands and has elected to proceed.  DESCRIPTION OF OPERATION:  After informed consent, the patient was brought to the major OR, placed on table, administered general anesthesia and then moved to the dorsal lithotomy position.  His genitalia was sterilely prepped and draped and an official time-out was then performed.  A 26-French resectoscope sheath with Timberlake obturator was then introduced into the bladder and the obturator removed and the 12-degree lens with the resectoscope element was then inserted.  The bladder was then fully and systematically inspected.  It was noted be free of any tumors or inflammatory lesions.  Ureteral orifices were  normal configuration and position and well away from the bladder neck.  There was no significant median lobe component protruding into the bladder. Several small bladder calculi were identified as well.  The Gyrus button was then used to vaporize the prostatic adenomatous tissue by starting at the 6 o'clock position and vaporizing back to the level of the veru and then vaporizing the left lobe down to the surgical capsule in a counterclockwise direction up to the 12 o'clock position. I then performed an identical maneuver on the right lobe of the prostate.  After fully vaporizing both lobes down to the surgical capsule, attention was directed to the forward back and some apical tissue which was resected back to the level of the veru with care being taken to remain proximal to the veru at all times.  I then used the Microvasive evacuator to remove all of the bladder calculi previously identified and photographed.  The bladder was then filled, and I reinspected the prostatic fossa and noted no arterial or significant venous bleeding.  I then removed the resectoscope and noted good flow of urine.  The Foley catheter was inserted and connected to close system drainage as well as continuous irrigation, and the patient was then moved to recovery room in stable and satisfactory condition. He tolerated procedure well  with no intraoperative complications.  He will be observed overnight with plans to discharge in the morning after a voiding trial.     Loraine Leriche C. Vernie Ammons, M.D.     MCO/MEDQ  D:  11/06/2010  T:  11/06/2010  Job:  [id number]  Electronically Signed by Ihor Gully M.D. on 11/19/2010 05:56:36 PM

## 2010-12-07 LAB — CBC
HCT: 36.1 % — ABNORMAL LOW (ref 39.0–52.0)
HCT: 37.6 % — ABNORMAL LOW (ref 39.0–52.0)
HCT: 33.3 % — ABNORMAL LOW (ref 39.0–52.0)
HCT: 34.7 % — ABNORMAL LOW (ref 39.0–52.0)
Hemoglobin: 12.7 g/dL — ABNORMAL LOW (ref 13.0–17.0)
Hemoglobin: 13.4 g/dL (ref 13.0–17.0)
Hemoglobin: 11.3 g/dL — ABNORMAL LOW (ref 13.0–17.0)
Hemoglobin: 11.8 g/dL — ABNORMAL LOW (ref 13.0–17.0)
MCH: 28.6 pg (ref 26.0–34.0)
MCH: 28.9 pg (ref 26.0–34.0)
MCH: 29.1 pg (ref 26.0–34.0)
MCH: 29.2 pg (ref 26.0–34.0)
MCH: 29.2 pg (ref 26.0–34.0)
MCH: 29.5 pg (ref 26.0–34.0)
MCHC: 33.6 g/dL (ref 30.0–36.0)
MCHC: 33.8 g/dL (ref 30.0–36.0)
MCHC: 34.1 g/dL (ref 30.0–36.0)
MCHC: 35.6 g/dL (ref 30.0–36.0)
MCHC: 33.8 g/dL (ref 30.0–36.0)
MCHC: 33.9 g/dL (ref 30.0–36.0)
MCHC: 33.9 g/dL (ref 30.0–36.0)
MCV: 82.9 fL (ref 78.0–100.0)
MCV: 84.7 fL (ref 78.0–100.0)
MCV: 85.5 fL (ref 78.0–100.0)
MCV: 87 fL (ref 78.0–100.0)
MCV: 87 fL (ref 78.0–100.0)
Platelets: 214 10*3/uL (ref 150–400)
Platelets: 219 10*3/uL (ref 150–400)
Platelets: 242 10*3/uL (ref 150–400)
Platelets: 200 10*3/uL (ref 150–400)
Platelets: 208 10*3/uL (ref 150–400)
Platelets: 231 10*3/uL (ref 150–400)
RBC: 4.26 MIL/uL (ref 4.22–5.81)
RBC: 4.32 MIL/uL (ref 4.22–5.81)
RBC: 4.54 MIL/uL (ref 4.22–5.81)
RBC: 3.83 MIL/uL — ABNORMAL LOW (ref 4.22–5.81)
RBC: 4.03 MIL/uL — ABNORMAL LOW (ref 4.22–5.81)
RBC: 4.08 MIL/uL — ABNORMAL LOW (ref 4.22–5.81)
RDW: 12.4 % (ref 11.5–15.5)
RDW: 12.6 % (ref 11.5–15.5)
RDW: 12.8 % (ref 11.5–15.5)
RDW: 12.6 % (ref 11.5–15.5)
RDW: 12.8 % (ref 11.5–15.5)
RDW: 12.8 % (ref 11.5–15.5)
RDW: 12.9 % (ref 11.5–15.5)
WBC: 10.4 10*3/uL (ref 4.0–10.5)
WBC: 11.1 10*3/uL — ABNORMAL HIGH (ref 4.0–10.5)
WBC: 13.9 10*3/uL — ABNORMAL HIGH (ref 4.0–10.5)
WBC: 9.6 10*3/uL (ref 4.0–10.5)
WBC: 8.5 10*3/uL (ref 4.0–10.5)
WBC: 8.8 10*3/uL (ref 4.0–10.5)
WBC: 9.2 10*3/uL (ref 4.0–10.5)

## 2010-12-07 LAB — URINALYSIS, ROUTINE W REFLEX MICROSCOPIC
Bilirubin Urine: NEGATIVE
Bilirubin Urine: NEGATIVE
Glucose, UA: NEGATIVE mg/dL
Ketones, ur: NEGATIVE mg/dL
Nitrite: POSITIVE — AB
Nitrite: POSITIVE — AB
Protein, ur: 30 mg/dL — AB
Protein, ur: NEGATIVE mg/dL
Specific Gravity, Urine: 1.011 (ref 1.005–1.030)
Specific Gravity, Urine: 1.014 (ref 1.005–1.030)
Specific Gravity, Urine: 1.008 (ref 1.005–1.030)
Urobilinogen, UA: 0.2 mg/dL (ref 0.0–1.0)
Urobilinogen, UA: 0.2 mg/dL (ref 0.0–1.0)
pH: 5 (ref 5.0–8.0)

## 2010-12-07 LAB — GLUCOSE, CAPILLARY
Glucose-Capillary: 163 mg/dL — ABNORMAL HIGH (ref 70–99)
Glucose-Capillary: 178 mg/dL — ABNORMAL HIGH (ref 70–99)
Glucose-Capillary: 214 mg/dL — ABNORMAL HIGH (ref 70–99)
Glucose-Capillary: 222 mg/dL — ABNORMAL HIGH (ref 70–99)
Glucose-Capillary: 247 mg/dL — ABNORMAL HIGH (ref 70–99)
Glucose-Capillary: 250 mg/dL — ABNORMAL HIGH (ref 70–99)
Glucose-Capillary: 256 mg/dL — ABNORMAL HIGH (ref 70–99)
Glucose-Capillary: 287 mg/dL — ABNORMAL HIGH (ref 70–99)
Glucose-Capillary: 292 mg/dL — ABNORMAL HIGH (ref 70–99)
Glucose-Capillary: 319 mg/dL — ABNORMAL HIGH (ref 70–99)
Glucose-Capillary: 502 mg/dL — ABNORMAL HIGH (ref 70–99)
Glucose-Capillary: 142 mg/dL — ABNORMAL HIGH (ref 70–99)
Glucose-Capillary: 158 mg/dL — ABNORMAL HIGH (ref 70–99)
Glucose-Capillary: 167 mg/dL — ABNORMAL HIGH (ref 70–99)
Glucose-Capillary: 180 mg/dL — ABNORMAL HIGH (ref 70–99)
Glucose-Capillary: 182 mg/dL — ABNORMAL HIGH (ref 70–99)
Glucose-Capillary: 189 mg/dL — ABNORMAL HIGH (ref 70–99)
Glucose-Capillary: 228 mg/dL — ABNORMAL HIGH (ref 70–99)
Glucose-Capillary: 240 mg/dL — ABNORMAL HIGH (ref 70–99)
Glucose-Capillary: 250 mg/dL — ABNORMAL HIGH (ref 70–99)
Glucose-Capillary: 258 mg/dL — ABNORMAL HIGH (ref 70–99)
Glucose-Capillary: 260 mg/dL — ABNORMAL HIGH (ref 70–99)
Glucose-Capillary: 266 mg/dL — ABNORMAL HIGH (ref 70–99)

## 2010-12-07 LAB — COMPREHENSIVE METABOLIC PANEL
ALT: 24 U/L (ref 0–53)
AST: 21 U/L (ref 0–37)
AST: 23 U/L (ref 0–37)
Albumin: 2.9 g/dL — ABNORMAL LOW (ref 3.5–5.2)
Albumin: 3.3 g/dL — ABNORMAL LOW (ref 3.5–5.2)
CO2: 23 mEq/L (ref 19–32)
CO2: 28 mEq/L (ref 19–32)
Calcium: 8.8 mg/dL (ref 8.4–10.5)
Calcium: 9.1 mg/dL (ref 8.4–10.5)
Creatinine, Ser: 1.41 mg/dL (ref 0.4–1.5)
Creatinine, Ser: 2.65 mg/dL — ABNORMAL HIGH (ref 0.4–1.5)
GFR calc Af Amer: 30 mL/min — ABNORMAL LOW (ref >60)
GFR calc Af Amer: >60 mL/min (ref >60)
GFR calc non Af Amer: 25 mL/min — ABNORMAL LOW (ref >60)
GFR calc non Af Amer: 51 mL/min — ABNORMAL LOW (ref >60)
Sodium: 128 mEq/L — ABNORMAL LOW (ref 135–145)
Sodium: 141 mEq/L (ref 135–145)
Total Protein: 6.5 g/dL (ref 6.0–8.3)
Total Protein: 6.7 g/dL (ref 6.0–8.3)

## 2010-12-07 LAB — BASIC METABOLIC PANEL
BUN: 48 mg/dL — ABNORMAL HIGH (ref 6–23)
BUN: 17 mg/dL (ref 6–23)
BUN: 18 mg/dL (ref 6–23)
BUN: 21 mg/dL (ref 6–23)
BUN: 22 mg/dL (ref 6–23)
BUN: 27 mg/dL — ABNORMAL HIGH (ref 6–23)
BUN: 37 mg/dL — ABNORMAL HIGH (ref 6–23)
BUN: 39 mg/dL — ABNORMAL HIGH (ref 6–23)
CO2: 25 mEq/L (ref 19–32)
CO2: 27 mEq/L (ref 19–32)
CO2: 28 mEq/L (ref 19–32)
CO2: 28 mEq/L (ref 19–32)
CO2: 28 mEq/L (ref 19–32)
CO2: 30 mEq/L (ref 19–32)
CO2: 32 mEq/L (ref 19–32)
CO2: 33 mEq/L — ABNORMAL HIGH (ref 19–32)
Calcium: 8.5 mg/dL (ref 8.4–10.5)
Calcium: 8.2 mg/dL — ABNORMAL LOW (ref 8.4–10.5)
Calcium: 8.5 mg/dL (ref 8.4–10.5)
Calcium: 8.8 mg/dL (ref 8.4–10.5)
Calcium: 9 mg/dL (ref 8.4–10.5)
Chloride: 96 mEq/L (ref 96–112)
Chloride: 103 mEq/L (ref 96–112)
Chloride: 103 mEq/L (ref 96–112)
Chloride: 91 mEq/L — ABNORMAL LOW (ref 96–112)
Chloride: 97 mEq/L (ref 96–112)
Creatinine, Ser: 1.23 mg/dL (ref 0.4–1.5)
Creatinine, Ser: 1.93 mg/dL — ABNORMAL HIGH (ref 0.4–1.5)
Creatinine, Ser: 2.36 mg/dL — ABNORMAL HIGH (ref 0.4–1.5)
Creatinine, Ser: 1.07 mg/dL (ref 0.4–1.5)
Creatinine, Ser: 1.27 mg/dL (ref 0.4–1.5)
Creatinine, Ser: 1.29 mg/dL (ref 0.4–1.5)
Creatinine, Ser: 1.81 mg/dL — ABNORMAL HIGH (ref 0.4–1.5)
GFR calc Af Amer: 34 mL/min — ABNORMAL LOW (ref >60)
GFR calc Af Amer: 43 mL/min — ABNORMAL LOW (ref >60)
GFR calc Af Amer: 46 mL/min — ABNORMAL LOW (ref >60)
GFR calc Af Amer: 58 mL/min — ABNORMAL LOW (ref >60)
GFR calc Af Amer: >60 mL/min (ref >60)
GFR calc non Af Amer: 60 mL/min — ABNORMAL LOW (ref >60)
GFR calc non Af Amer: 49 mL/min — ABNORMAL LOW (ref >60)
Glucose, Bld: 188 mg/dL — ABNORMAL HIGH (ref 70–99)
Glucose, Bld: 223 mg/dL — ABNORMAL HIGH (ref 70–99)
Glucose, Bld: 241 mg/dL — ABNORMAL HIGH (ref 70–99)
Glucose, Bld: 246 mg/dL — ABNORMAL HIGH (ref 70–99)
Glucose, Bld: 258 mg/dL — ABNORMAL HIGH (ref 70–99)
Potassium: 4.1 mEq/L (ref 3.5–5.1)
Potassium: 3.2 mEq/L — ABNORMAL LOW (ref 3.5–5.1)
Potassium: 3.5 mEq/L (ref 3.5–5.1)
Potassium: 4.7 mEq/L (ref 3.5–5.1)
Sodium: 139 mEq/L (ref 135–145)
Sodium: 134 mEq/L — ABNORMAL LOW (ref 135–145)
Sodium: 139 mEq/L (ref 135–145)

## 2010-12-07 LAB — CREATININE, URINE, RANDOM: Creatinine, Urine: 127 mg/dL

## 2010-12-07 LAB — PROTIME-INR
INR: 2.16 — ABNORMAL HIGH (ref 0.00–1.49)
INR: 2.59 — ABNORMAL HIGH (ref 0.00–1.49)
INR: 0.98 (ref 0.00–1.49)
Prothrombin Time: 39.6 seconds — ABNORMAL HIGH (ref 11.6–15.2)
Prothrombin Time: 13.2 seconds (ref 11.6–15.2)
Prothrombin Time: 13.4 seconds (ref 11.6–15.2)
Prothrombin Time: 24.8 seconds — ABNORMAL HIGH (ref 11.6–15.2)
Prothrombin Time: 31.9 seconds — ABNORMAL HIGH (ref 11.6–15.2)

## 2010-12-07 LAB — URINE CULTURE
Culture  Setup Time: 201112171056
Culture  Setup Time: 201112020042
Special Requests: NEGATIVE

## 2010-12-07 LAB — URINE MICROSCOPIC-ADD ON

## 2010-12-07 LAB — CARDIAC PANEL(CRET KIN+CKTOT+MB+TROPI)
CK, MB: 2.8 ng/mL (ref 0.3–4.0)
CK, MB: 3.1 ng/mL (ref 0.3–4.0)
CK, MB: 2.6 ng/mL (ref 0.3–4.0)
Relative Index: INVALID (ref 0.0–2.5)
Total CK: 75 U/L (ref 7–232)
Troponin I: 0.11 ng/mL — ABNORMAL HIGH (ref 0.00–0.06)
Troponin I: 0.12 ng/mL — ABNORMAL HIGH (ref 0.00–0.06)

## 2010-12-07 LAB — MAGNESIUM
Magnesium: 1.9 mg/dL (ref 1.5–2.5)
Magnesium: 1.9 mg/dL (ref 1.5–2.5)

## 2010-12-07 LAB — BRAIN NATRIURETIC PEPTIDE
Pro B Natriuretic peptide (BNP): 245 pg/mL — ABNORMAL HIGH (ref 0.0–100.0)
Pro B Natriuretic peptide (BNP): 709 pg/mL — ABNORMAL HIGH (ref 0.0–100.0)

## 2010-12-07 LAB — CREATININE, SERUM
Creatinine, Ser: 1.71 mg/dL — ABNORMAL HIGH (ref 0.4–1.5)
GFR calc non Af Amer: 41 mL/min — ABNORMAL LOW (ref >60)

## 2010-12-07 LAB — CK TOTAL AND CKMB (NOT AT ARMC)
CK, MB: 4.1 ng/mL — ABNORMAL HIGH (ref 0.3–4.0)
Relative Index: INVALID (ref 0.0–2.5)
Total CK: 76 U/L (ref 7–232)

## 2010-12-07 LAB — POCT I-STAT, CHEM 8
Chloride: 98 mEq/L (ref 96–112)
Creatinine, Ser: 2.5 mg/dL — ABNORMAL HIGH (ref 0.4–1.5)
Hemoglobin: 13.3 g/dL (ref 13.0–17.0)
Potassium: 4.8 mEq/L (ref 3.5–5.1)
Sodium: 130 mEq/L — ABNORMAL LOW (ref 135–145)
TCO2: 28 mmol/L (ref 0–100)

## 2010-12-07 LAB — TSH: TSH: 2.961 u[IU]/mL (ref 0.350–4.500)

## 2010-12-07 LAB — DIFFERENTIAL
Eosinophils Relative: 2 % (ref 0–5)
Lymphocytes Relative: 14 % (ref 12–46)
Lymphs Abs: 1.6 10*3/uL (ref 0.7–4.0)
Monocytes Relative: 5 % (ref 3–12)

## 2010-12-07 LAB — HEPARIN LEVEL (UNFRACTIONATED)
Heparin Unfractionated: <0.1 IU/mL — ABNORMAL LOW (ref 0.30–0.70)
Heparin Unfractionated: <0.1 IU/mL — ABNORMAL LOW (ref 0.30–0.70)

## 2010-12-07 LAB — TROPONIN I: Troponin I: 0.1 ng/mL — ABNORMAL HIGH (ref 0.00–0.06)

## 2010-12-07 LAB — D-DIMER, QUANTITATIVE: D-Dimer, Quant: 0.3 ug/mL-FEU (ref 0.00–0.48)

## 2010-12-07 LAB — HEMOGLOBIN A1C: Mean Plasma Glucose: 390 mg/dL — ABNORMAL HIGH (ref <117)

## 2010-12-08 LAB — COMPREHENSIVE METABOLIC PANEL
AST: 14 U/L (ref 0–37)
Albumin: 2.9 g/dL — ABNORMAL LOW (ref 3.5–5.2)
Alkaline Phosphatase: 103 U/L (ref 39–117)
BUN: 14 mg/dL (ref 6–23)
CO2: 28 mEq/L (ref 19–32)
Chloride: 102 mEq/L (ref 96–112)
Creatinine, Ser: 1.25 mg/dL (ref 0.4–1.5)
GFR calc non Af Amer: 59 mL/min — ABNORMAL LOW (ref >60)
Potassium: 3.3 mEq/L — ABNORMAL LOW (ref 3.5–5.1)
Total Bilirubin: 0.7 mg/dL (ref 0.3–1.2)

## 2010-12-08 LAB — GLUCOSE, CAPILLARY: Glucose-Capillary: 246 mg/dL — ABNORMAL HIGH (ref 70–99)

## 2010-12-08 LAB — DIFFERENTIAL
Basophils Absolute: 0 10*3/uL (ref 0.0–0.1)
Basophils Relative: 0 % (ref 0–1)
Eosinophils Relative: 4 % (ref 0–5)
Lymphocytes Relative: 15 % (ref 12–46)
Monocytes Absolute: 0.6 10*3/uL (ref 0.1–1.0)
Neutro Abs: 6 10*3/uL (ref 1.7–7.7)

## 2010-12-08 LAB — CBC
Hemoglobin: 11.7 g/dL — ABNORMAL LOW (ref 13.0–17.0)
MCH: 29.3 pg (ref 26.0–34.0)
MCV: 86.5 fL (ref 78.0–100.0)
Platelets: 185 10*3/uL (ref 150–400)
RBC: 4 MIL/uL — ABNORMAL LOW (ref 4.22–5.81)
WBC: 8.2 10*3/uL (ref 4.0–10.5)

## 2010-12-08 LAB — DIGOXIN LEVEL: Digoxin Level: 0.8 ng/mL (ref 0.8–2.0)

## 2010-12-10 LAB — PROTIME-INR
INR: 1 (ref 0.00–1.49)
INR: 1.01 (ref 0.00–1.49)
INR: 1.12 (ref 0.00–1.49)
INR: 1.31 (ref 0.00–1.49)
INR: 2.03 — ABNORMAL HIGH (ref 0.00–1.49)
INR: 2.81 — ABNORMAL HIGH (ref 0.00–1.49)
Prothrombin Time: 13.4 seconds (ref 11.6–15.2)
Prothrombin Time: 15 seconds (ref 11.6–15.2)
Prothrombin Time: 16.5 seconds — ABNORMAL HIGH (ref 11.6–15.2)
Prothrombin Time: 23.1 seconds — ABNORMAL HIGH (ref 11.6–15.2)
Prothrombin Time: 29.7 seconds — ABNORMAL HIGH (ref 11.6–15.2)

## 2010-12-10 LAB — CBC
HCT: 35.3 % — ABNORMAL LOW (ref 39.0–52.0)
HCT: 36.2 % — ABNORMAL LOW (ref 39.0–52.0)
HCT: 37.1 % — ABNORMAL LOW (ref 39.0–52.0)
HCT: 37.1 % — ABNORMAL LOW (ref 39.0–52.0)
Hemoglobin: 12.9 g/dL — ABNORMAL LOW (ref 13.0–17.0)
Hemoglobin: 13 g/dL (ref 13.0–17.0)
MCH: 30.2 pg (ref 26.0–34.0)
MCHC: 34.1 g/dL (ref 30.0–36.0)
MCHC: 35.6 g/dL (ref 30.0–36.0)
MCV: 87.3 fL (ref 78.0–100.0)
Platelets: 186 10*3/uL (ref 150–400)
Platelets: 212 10*3/uL (ref 150–400)
Platelets: 221 10*3/uL (ref 150–400)
Platelets: 224 10*3/uL (ref 150–400)
RBC: 3.71 MIL/uL — ABNORMAL LOW (ref 4.22–5.81)
RBC: 4.22 MIL/uL (ref 4.22–5.81)
RBC: 4.25 MIL/uL (ref 4.22–5.81)
RBC: 4.28 MIL/uL (ref 4.22–5.81)
RDW: 13.5 % (ref 11.5–15.5)
RDW: 13.6 % (ref 11.5–15.5)
RDW: 13.9 % (ref 11.5–15.5)
WBC: 13.9 10*3/uL — ABNORMAL HIGH (ref 4.0–10.5)
WBC: 8.1 10*3/uL (ref 4.0–10.5)
WBC: 8.3 10*3/uL (ref 4.0–10.5)
WBC: 8.8 10*3/uL (ref 4.0–10.5)

## 2010-12-10 LAB — GLUCOSE, CAPILLARY
Glucose-Capillary: 178 mg/dL — ABNORMAL HIGH (ref 70–99)
Glucose-Capillary: 180 mg/dL — ABNORMAL HIGH (ref 70–99)
Glucose-Capillary: 239 mg/dL — ABNORMAL HIGH (ref 70–99)
Glucose-Capillary: 239 mg/dL — ABNORMAL HIGH (ref 70–99)
Glucose-Capillary: 249 mg/dL — ABNORMAL HIGH (ref 70–99)
Glucose-Capillary: 268 mg/dL — ABNORMAL HIGH (ref 70–99)
Glucose-Capillary: 318 mg/dL — ABNORMAL HIGH (ref 70–99)
Glucose-Capillary: 351 mg/dL — ABNORMAL HIGH (ref 70–99)
Glucose-Capillary: 387 mg/dL — ABNORMAL HIGH (ref 70–99)

## 2010-12-10 LAB — TROPONIN I: Troponin I: 0.12 ng/mL — ABNORMAL HIGH (ref 0.00–0.06)

## 2010-12-10 LAB — URINE MICROSCOPIC-ADD ON

## 2010-12-10 LAB — BASIC METABOLIC PANEL
BUN: 20 mg/dL (ref 6–23)
BUN: 43 mg/dL — ABNORMAL HIGH (ref 6–23)
BUN: 45 mg/dL — ABNORMAL HIGH (ref 6–23)
CO2: 24 mEq/L (ref 19–32)
Calcium: 8.4 mg/dL (ref 8.4–10.5)
Calcium: 9.3 mg/dL (ref 8.4–10.5)
Calcium: 9.5 mg/dL (ref 8.4–10.5)
Chloride: 106 mEq/L (ref 96–112)
Creatinine, Ser: 1.22 mg/dL (ref 0.4–1.5)
Creatinine, Ser: 1.38 mg/dL (ref 0.4–1.5)
GFR calc Af Amer: >60 mL/min (ref >60)
GFR calc Af Amer: >60 mL/min (ref >60)
GFR calc Af Amer: >60 mL/min (ref >60)
GFR calc Af Amer: >60 mL/min (ref >60)
GFR calc non Af Amer: 52 mL/min — ABNORMAL LOW (ref >60)
GFR calc non Af Amer: 54 mL/min — ABNORMAL LOW (ref >60)
GFR calc non Af Amer: >60 mL/min (ref >60)
GFR calc non Af Amer: >60 mL/min (ref >60)
Glucose, Bld: 260 mg/dL — ABNORMAL HIGH (ref 70–99)
Potassium: 4.9 mEq/L (ref 3.5–5.1)
Potassium: 5.1 mEq/L (ref 3.5–5.1)
Potassium: 5.2 mEq/L — ABNORMAL HIGH (ref 3.5–5.1)
Potassium: 5.4 mEq/L — ABNORMAL HIGH (ref 3.5–5.1)
Sodium: 136 mEq/L (ref 135–145)
Sodium: 136 mEq/L (ref 135–145)

## 2010-12-10 LAB — URINALYSIS, ROUTINE W REFLEX MICROSCOPIC
Glucose, UA: >1000 mg/dL — AB
Ketones, ur: NEGATIVE mg/dL
Leukocytes, UA: NEGATIVE
Nitrite: NEGATIVE
Specific Gravity, Urine: 1.029 (ref 1.005–1.030)
pH: 5.5 (ref 5.0–8.0)

## 2010-12-10 LAB — CK TOTAL AND CKMB (NOT AT ARMC)
CK, MB: 3.5 ng/mL (ref 0.3–4.0)
CK, MB: 3.8 ng/mL (ref 0.3–4.0)
Relative Index: 2.1 (ref 0.0–2.5)
Relative Index: 2.2 (ref 0.0–2.5)
Relative Index: 2.7 — ABNORMAL HIGH (ref 0.0–2.5)
Total CK: 140 U/L (ref 7–232)
Total CK: 142 U/L (ref 7–232)
Total CK: 164 U/L (ref 7–232)

## 2010-12-10 LAB — DIGOXIN LEVEL: Digoxin Level: <0.2 ng/mL — ABNORMAL LOW (ref 0.8–2.0)

## 2010-12-10 LAB — POCT I-STAT, CHEM 8
BUN: 13 mg/dL (ref 6–23)
Chloride: 107 mEq/L (ref 96–112)
Sodium: 139 mEq/L (ref 135–145)
TCO2: 20 mmol/L (ref 0–100)

## 2010-12-10 LAB — HEPARIN LEVEL (UNFRACTIONATED): Heparin Unfractionated: 0.37 IU/mL (ref 0.30–0.70)

## 2010-12-10 LAB — DIFFERENTIAL
Basophils Relative: 0 % (ref 0–1)
Lymphocytes Relative: 8 % — ABNORMAL LOW (ref 12–46)
Lymphs Abs: 1.1 10*3/uL (ref 0.7–4.0)
Monocytes Absolute: 1.2 10*3/uL — ABNORMAL HIGH (ref 0.1–1.0)
Monocytes Relative: 8 % (ref 3–12)
Neutro Abs: 11.6 10*3/uL — ABNORMAL HIGH (ref 1.7–7.7)
Neutrophils Relative %: 83 % — ABNORMAL HIGH (ref 43–77)

## 2010-12-10 LAB — TSH: TSH: 1.52 u[IU]/mL (ref 0.350–4.500)

## 2010-12-10 LAB — POCT CARDIAC MARKERS
Myoglobin, poc: 162 ng/mL (ref 12–200)
Troponin i, poc: 0.13 ng/mL — ABNORMAL HIGH (ref 0.00–0.09)

## 2010-12-10 LAB — MRSA PCR SCREENING: MRSA by PCR: NEGATIVE

## 2010-12-11 ENCOUNTER — Other Ambulatory Visit: Payer: Self-pay | Admitting: Family Medicine

## 2010-12-11 NOTE — Telephone Encounter (Signed)
Called pt back.  It has been a while since we've checked his INR.  He states that he has appt for INR on Tues.  Advised pt that I refilled coumadin but I really need him to come on Tues for INR check. Pt agreed.

## 2010-12-11 NOTE — Telephone Encounter (Signed)
Refill request

## 2010-12-15 ENCOUNTER — Ambulatory Visit (INDEPENDENT_AMBULATORY_CARE_PROVIDER_SITE_OTHER): Payer: Medicare Other | Admitting: *Deleted

## 2010-12-15 DIAGNOSIS — Z7901 Long term (current) use of anticoagulants: Secondary | ICD-10-CM

## 2010-12-15 DIAGNOSIS — I82409 Acute embolism and thrombosis of unspecified deep veins of unspecified lower extremity: Secondary | ICD-10-CM

## 2010-12-22 ENCOUNTER — Ambulatory Visit: Payer: Medicare Other

## 2010-12-22 DIAGNOSIS — Z7901 Long term (current) use of anticoagulants: Secondary | ICD-10-CM

## 2010-12-22 DIAGNOSIS — I82409 Acute embolism and thrombosis of unspecified deep veins of unspecified lower extremity: Secondary | ICD-10-CM

## 2010-12-22 LAB — PROTIME-INR
INR: 1.65 — ABNORMAL HIGH (ref <1.50)
Prothrombin Time: 19.7 seconds — ABNORMAL HIGH (ref 11.6–15.2)

## 2010-12-22 LAB — POCT INR: INR: 1.65

## 2011-01-04 ENCOUNTER — Ambulatory Visit: Payer: Medicare Other | Admitting: Family Medicine

## 2011-01-06 LAB — GLUCOSE, CAPILLARY
Glucose-Capillary: 101 mg/dL — ABNORMAL HIGH (ref 70–99)
Glucose-Capillary: 105 mg/dL — ABNORMAL HIGH (ref 70–99)
Glucose-Capillary: 105 mg/dL — ABNORMAL HIGH (ref 70–99)
Glucose-Capillary: 111 mg/dL — ABNORMAL HIGH (ref 70–99)
Glucose-Capillary: 114 mg/dL — ABNORMAL HIGH (ref 70–99)
Glucose-Capillary: 114 mg/dL — ABNORMAL HIGH (ref 70–99)
Glucose-Capillary: 115 mg/dL — ABNORMAL HIGH (ref 70–99)
Glucose-Capillary: 119 mg/dL — ABNORMAL HIGH (ref 70–99)
Glucose-Capillary: 120 mg/dL — ABNORMAL HIGH (ref 70–99)
Glucose-Capillary: 122 mg/dL — ABNORMAL HIGH (ref 70–99)
Glucose-Capillary: 123 mg/dL — ABNORMAL HIGH (ref 70–99)
Glucose-Capillary: 127 mg/dL — ABNORMAL HIGH (ref 70–99)
Glucose-Capillary: 134 mg/dL — ABNORMAL HIGH (ref 70–99)
Glucose-Capillary: 135 mg/dL — ABNORMAL HIGH (ref 70–99)
Glucose-Capillary: 136 mg/dL — ABNORMAL HIGH (ref 70–99)
Glucose-Capillary: 136 mg/dL — ABNORMAL HIGH (ref 70–99)
Glucose-Capillary: 141 mg/dL — ABNORMAL HIGH (ref 70–99)
Glucose-Capillary: 141 mg/dL — ABNORMAL HIGH (ref 70–99)
Glucose-Capillary: 141 mg/dL — ABNORMAL HIGH (ref 70–99)
Glucose-Capillary: 150 mg/dL — ABNORMAL HIGH (ref 70–99)
Glucose-Capillary: 166 mg/dL — ABNORMAL HIGH (ref 70–99)
Glucose-Capillary: 170 mg/dL — ABNORMAL HIGH (ref 70–99)
Glucose-Capillary: 172 mg/dL — ABNORMAL HIGH (ref 70–99)
Glucose-Capillary: 172 mg/dL — ABNORMAL HIGH (ref 70–99)
Glucose-Capillary: 70 mg/dL (ref 70–99)
Glucose-Capillary: 71 mg/dL (ref 70–99)
Glucose-Capillary: 76 mg/dL (ref 70–99)
Glucose-Capillary: 78 mg/dL (ref 70–99)
Glucose-Capillary: 79 mg/dL (ref 70–99)
Glucose-Capillary: 88 mg/dL (ref 70–99)
Glucose-Capillary: 88 mg/dL (ref 70–99)
Glucose-Capillary: 93 mg/dL (ref 70–99)

## 2011-01-06 LAB — CBC
HCT: 32.8 % — ABNORMAL LOW (ref 39.0–52.0)
HCT: 33.8 % — ABNORMAL LOW (ref 39.0–52.0)
HCT: 33.9 % — ABNORMAL LOW (ref 39.0–52.0)
HCT: 34.7 % — ABNORMAL LOW (ref 39.0–52.0)
HCT: 36.2 % — ABNORMAL LOW (ref 39.0–52.0)
HCT: 36.4 % — ABNORMAL LOW (ref 39.0–52.0)
HCT: 36.5 % — ABNORMAL LOW (ref 39.0–52.0)
Hemoglobin: 11.5 g/dL — ABNORMAL LOW (ref 13.0–17.0)
Hemoglobin: 12 g/dL — ABNORMAL LOW (ref 13.0–17.0)
Hemoglobin: 12.3 g/dL — ABNORMAL LOW (ref 13.0–17.0)
Hemoglobin: 12.5 g/dL — ABNORMAL LOW (ref 13.0–17.0)
Hemoglobin: 12.5 g/dL — ABNORMAL LOW (ref 13.0–17.0)
Hemoglobin: 12.7 g/dL — ABNORMAL LOW (ref 13.0–17.0)
Hemoglobin: 13.5 g/dL (ref 13.0–17.0)
MCHC: 33.8 g/dL (ref 30.0–36.0)
MCHC: 34 g/dL (ref 30.0–36.0)
MCHC: 34 g/dL (ref 30.0–36.0)
MCHC: 34.1 g/dL (ref 30.0–36.0)
MCHC: 34.2 g/dL (ref 30.0–36.0)
MCHC: 34.2 g/dL (ref 30.0–36.0)
MCHC: 34.4 g/dL (ref 30.0–36.0)
MCHC: 35.2 g/dL (ref 30.0–36.0)
MCV: 89 fL (ref 78.0–100.0)
MCV: 89.7 fL (ref 78.0–100.0)
MCV: 90.3 fL (ref 78.0–100.0)
MCV: 90.6 fL (ref 78.0–100.0)
Platelets: 230 10*3/uL (ref 150–400)
Platelets: 235 10*3/uL (ref 150–400)
Platelets: 249 10*3/uL (ref 150–400)
Platelets: 251 10*3/uL (ref 150–400)
Platelets: 252 10*3/uL (ref 150–400)
RBC: 3.74 MIL/uL — ABNORMAL LOW (ref 4.22–5.81)
RBC: 3.85 MIL/uL — ABNORMAL LOW (ref 4.22–5.81)
RBC: 3.88 MIL/uL — ABNORMAL LOW (ref 4.22–5.81)
RBC: 3.92 MIL/uL — ABNORMAL LOW (ref 4.22–5.81)
RBC: 4.02 MIL/uL — ABNORMAL LOW (ref 4.22–5.81)
RBC: 4.06 MIL/uL — ABNORMAL LOW (ref 4.22–5.81)
RBC: 4.1 MIL/uL — ABNORMAL LOW (ref 4.22–5.81)
RBC: 4.4 MIL/uL (ref 4.22–5.81)
RDW: 13.5 % (ref 11.5–15.5)
RDW: 13.9 % (ref 11.5–15.5)
RDW: 14 % (ref 11.5–15.5)
RDW: 14.1 % (ref 11.5–15.5)
RDW: 14.1 % (ref 11.5–15.5)
RDW: 14.1 % (ref 11.5–15.5)
RDW: 14.3 % (ref 11.5–15.5)
RDW: 14.8 % (ref 11.5–15.5)
RDW: 15.5 % (ref 11.5–15.5)
WBC: 7.1 10*3/uL (ref 4.0–10.5)
WBC: 8.2 10*3/uL (ref 4.0–10.5)
WBC: 8.2 10*3/uL (ref 4.0–10.5)
WBC: 8.7 10*3/uL (ref 4.0–10.5)
WBC: 9.3 10*3/uL (ref 4.0–10.5)

## 2011-01-06 LAB — BASIC METABOLIC PANEL
BUN: 19 mg/dL (ref 6–23)
BUN: 20 mg/dL (ref 6–23)
BUN: 21 mg/dL (ref 6–23)
BUN: 33 mg/dL — ABNORMAL HIGH (ref 6–23)
BUN: 38 mg/dL — ABNORMAL HIGH (ref 6–23)
BUN: 39 mg/dL — ABNORMAL HIGH (ref 6–23)
BUN: 39 mg/dL — ABNORMAL HIGH (ref 6–23)
BUN: 41 mg/dL — ABNORMAL HIGH (ref 6–23)
BUN: 43 mg/dL — ABNORMAL HIGH (ref 6–23)
BUN: 45 mg/dL — ABNORMAL HIGH (ref 6–23)
CO2: 23 mEq/L (ref 19–32)
CO2: 23 mEq/L (ref 19–32)
CO2: 26 mEq/L (ref 19–32)
CO2: 26 mEq/L (ref 19–32)
CO2: 26 mEq/L (ref 19–32)
CO2: 26 mEq/L (ref 19–32)
CO2: 27 mEq/L (ref 19–32)
CO2: 28 mEq/L (ref 19–32)
CO2: 30 mEq/L (ref 19–32)
CO2: 32 mEq/L (ref 19–32)
Calcium: 8.6 mg/dL (ref 8.4–10.5)
Calcium: 8.8 mg/dL (ref 8.4–10.5)
Calcium: 8.9 mg/dL (ref 8.4–10.5)
Calcium: 8.9 mg/dL (ref 8.4–10.5)
Calcium: 9 mg/dL (ref 8.4–10.5)
Calcium: 9.1 mg/dL (ref 8.4–10.5)
Calcium: 9.2 mg/dL (ref 8.4–10.5)
Calcium: 9.3 mg/dL (ref 8.4–10.5)
Calcium: 9.3 mg/dL (ref 8.4–10.5)
Chloride: 103 mEq/L (ref 96–112)
Chloride: 103 mEq/L (ref 96–112)
Chloride: 105 mEq/L (ref 96–112)
Chloride: 106 mEq/L (ref 96–112)
Chloride: 107 mEq/L (ref 96–112)
Chloride: 109 mEq/L (ref 96–112)
Chloride: 111 mEq/L (ref 96–112)
Creatinine, Ser: 0.8 mg/dL (ref 0.4–1.5)
Creatinine, Ser: 1 mg/dL (ref 0.4–1.5)
Creatinine, Ser: 1.04 mg/dL (ref 0.4–1.5)
Creatinine, Ser: 1.31 mg/dL (ref 0.4–1.5)
Creatinine, Ser: 1.38 mg/dL (ref 0.4–1.5)
Creatinine, Ser: 1.38 mg/dL (ref 0.4–1.5)
Creatinine, Ser: 1.52 mg/dL — ABNORMAL HIGH (ref 0.4–1.5)
Creatinine, Ser: 1.91 mg/dL — ABNORMAL HIGH (ref 0.4–1.5)
Creatinine, Ser: 1.94 mg/dL — ABNORMAL HIGH (ref 0.4–1.5)
GFR calc Af Amer: 44 mL/min — ABNORMAL LOW (ref >60)
GFR calc Af Amer: 57 mL/min — ABNORMAL LOW (ref >60)
GFR calc Af Amer: >60 mL/min (ref >60)
GFR calc Af Amer: >60 mL/min (ref >60)
GFR calc Af Amer: >60 mL/min (ref >60)
GFR calc non Af Amer: 36 mL/min — ABNORMAL LOW (ref >60)
GFR calc non Af Amer: 36 mL/min — ABNORMAL LOW (ref >60)
GFR calc non Af Amer: 37 mL/min — ABNORMAL LOW (ref >60)
GFR calc non Af Amer: 53 mL/min — ABNORMAL LOW (ref >60)
GFR calc non Af Amer: 53 mL/min — ABNORMAL LOW (ref >60)
GFR calc non Af Amer: 55 mL/min — ABNORMAL LOW (ref >60)
GFR calc non Af Amer: 56 mL/min — ABNORMAL LOW (ref >60)
GFR calc non Af Amer: >60 mL/min (ref >60)
GFR calc non Af Amer: >60 mL/min (ref >60)
Glucose, Bld: 101 mg/dL — ABNORMAL HIGH (ref 70–99)
Glucose, Bld: 104 mg/dL — ABNORMAL HIGH (ref 70–99)
Glucose, Bld: 111 mg/dL — ABNORMAL HIGH (ref 70–99)
Glucose, Bld: 117 mg/dL — ABNORMAL HIGH (ref 70–99)
Glucose, Bld: 117 mg/dL — ABNORMAL HIGH (ref 70–99)
Glucose, Bld: 119 mg/dL — ABNORMAL HIGH (ref 70–99)
Glucose, Bld: 140 mg/dL — ABNORMAL HIGH (ref 70–99)
Glucose, Bld: 152 mg/dL — ABNORMAL HIGH (ref 70–99)
Glucose, Bld: 157 mg/dL — ABNORMAL HIGH (ref 70–99)
Glucose, Bld: 158 mg/dL — ABNORMAL HIGH (ref 70–99)
Glucose, Bld: 211 mg/dL — ABNORMAL HIGH (ref 70–99)
Glucose, Bld: 72 mg/dL (ref 70–99)
Glucose, Bld: 87 mg/dL (ref 70–99)
Potassium: 3.8 mEq/L (ref 3.5–5.1)
Potassium: 4.3 mEq/L (ref 3.5–5.1)
Potassium: 4.5 mEq/L (ref 3.5–5.1)
Potassium: 4.8 mEq/L (ref 3.5–5.1)
Potassium: 4.9 mEq/L (ref 3.5–5.1)
Potassium: 5.1 mEq/L (ref 3.5–5.1)
Potassium: 6 mEq/L — ABNORMAL HIGH (ref 3.5–5.1)
Potassium: 6 mEq/L — ABNORMAL HIGH (ref 3.5–5.1)
Sodium: 131 mEq/L — ABNORMAL LOW (ref 135–145)
Sodium: 136 mEq/L (ref 135–145)
Sodium: 137 mEq/L (ref 135–145)
Sodium: 138 mEq/L (ref 135–145)
Sodium: 139 mEq/L (ref 135–145)
Sodium: 140 mEq/L (ref 135–145)
Sodium: 141 mEq/L (ref 135–145)
Sodium: 142 mEq/L (ref 135–145)

## 2011-01-06 LAB — CARDIAC PANEL(CRET KIN+CKTOT+MB+TROPI)
CK, MB: 2.4 ng/mL (ref 0.3–4.0)
Relative Index: INVALID (ref 0.0–2.5)
Relative Index: INVALID (ref 0.0–2.5)
Relative Index: INVALID (ref 0.0–2.5)
Total CK: 72 U/L (ref 7–232)
Total CK: 74 U/L (ref 7–232)
Troponin I: 0.1 ng/mL — ABNORMAL HIGH (ref 0.00–0.06)

## 2011-01-06 LAB — URINALYSIS, ROUTINE W REFLEX MICROSCOPIC
Nitrite: NEGATIVE
Specific Gravity, Urine: 1.018 (ref 1.005–1.030)
Urobilinogen, UA: 1 mg/dL (ref 0.0–1.0)
pH: 5.5 (ref 5.0–8.0)

## 2011-01-06 LAB — COMPREHENSIVE METABOLIC PANEL
ALT: 17 U/L (ref 0–53)
Albumin: 2.9 g/dL — ABNORMAL LOW (ref 3.5–5.2)
Alkaline Phosphatase: 105 U/L (ref 39–117)
Calcium: 8.8 mg/dL (ref 8.4–10.5)
Glucose, Bld: 92 mg/dL (ref 70–99)
Potassium: 4.2 mEq/L (ref 3.5–5.1)
Sodium: 140 mEq/L (ref 135–145)
Total Protein: 5.1 g/dL — ABNORMAL LOW (ref 6.0–8.3)

## 2011-01-06 LAB — PROTIME-INR
INR: 1 (ref 0.00–1.49)
INR: 1.1 (ref 0.00–1.49)
INR: 1.3 (ref 0.00–1.49)
INR: 2.4 — ABNORMAL HIGH (ref 0.00–1.49)
Prothrombin Time: 13.8 seconds (ref 11.6–15.2)
Prothrombin Time: 14.6 seconds (ref 11.6–15.2)
Prothrombin Time: 16.5 seconds — ABNORMAL HIGH (ref 11.6–15.2)
Prothrombin Time: 27.4 seconds — ABNORMAL HIGH (ref 11.6–15.2)

## 2011-01-06 LAB — POTASSIUM: Potassium: 6.6 mEq/L (ref 3.5–5.1)

## 2011-01-06 LAB — BRAIN NATRIURETIC PEPTIDE: Pro B Natriuretic peptide (BNP): 340 pg/mL — ABNORMAL HIGH (ref 0.0–100.0)

## 2011-01-07 LAB — CBC
MCV: 90.2 fL (ref 78.0–100.0)
RBC: 4 MIL/uL — ABNORMAL LOW (ref 4.22–5.81)
WBC: 8.5 10*3/uL (ref 4.0–10.5)

## 2011-01-07 LAB — DIFFERENTIAL
Eosinophils Absolute: 0.1 10*3/uL (ref 0.0–0.7)
Lymphocytes Relative: 18 % (ref 12–46)
Lymphs Abs: 1.5 10*3/uL (ref 0.7–4.0)
Monocytes Relative: 5 % (ref 3–12)
Neutro Abs: 6.4 10*3/uL (ref 1.7–7.7)
Neutrophils Relative %: 75 % (ref 43–77)

## 2011-01-07 LAB — HEMOGLOBIN A1C
Hgb A1c MFr Bld: 11.5 % — ABNORMAL HIGH (ref 4.6–6.1)
Mean Plasma Glucose: 283 mg/dL

## 2011-01-07 LAB — POCT CARDIAC MARKERS
CKMB, poc: 3.1 ng/mL (ref 1.0–8.0)
Myoglobin, poc: 89 ng/mL (ref 12–200)

## 2011-01-07 LAB — BRAIN NATRIURETIC PEPTIDE: Pro B Natriuretic peptide (BNP): 624 pg/mL — ABNORMAL HIGH (ref 0.0–100.0)

## 2011-01-07 LAB — BASIC METABOLIC PANEL
Chloride: 102 mEq/L (ref 96–112)
Creatinine, Ser: 0.84 mg/dL (ref 0.4–1.5)
GFR calc Af Amer: >60 mL/min (ref >60)

## 2011-01-13 ENCOUNTER — Ambulatory Visit (INDEPENDENT_AMBULATORY_CARE_PROVIDER_SITE_OTHER): Payer: Medicare Other | Admitting: Family Medicine

## 2011-01-13 ENCOUNTER — Encounter: Payer: Self-pay | Admitting: Family Medicine

## 2011-01-13 ENCOUNTER — Ambulatory Visit (INDEPENDENT_AMBULATORY_CARE_PROVIDER_SITE_OTHER): Payer: Medicare Other | Admitting: *Deleted

## 2011-01-13 VITALS — BP 110/65 | HR 71 | Temp 97.4°F | Ht 69.0 in | Wt 235.1 lb

## 2011-01-13 DIAGNOSIS — I82409 Acute embolism and thrombosis of unspecified deep veins of unspecified lower extremity: Secondary | ICD-10-CM

## 2011-01-13 DIAGNOSIS — L84 Corns and callosities: Secondary | ICD-10-CM

## 2011-01-13 DIAGNOSIS — E119 Type 2 diabetes mellitus without complications: Secondary | ICD-10-CM

## 2011-01-13 DIAGNOSIS — E1165 Type 2 diabetes mellitus with hyperglycemia: Secondary | ICD-10-CM

## 2011-01-13 DIAGNOSIS — R42 Dizziness and giddiness: Secondary | ICD-10-CM

## 2011-01-13 DIAGNOSIS — Z7901 Long term (current) use of anticoagulants: Secondary | ICD-10-CM

## 2011-01-13 LAB — POCT INR: INR: 2

## 2011-01-13 LAB — POCT GLYCOSYLATED HEMOGLOBIN (HGB A1C): Hemoglobin A1C: 9.5

## 2011-01-13 MED ORDER — INSULIN GLARGINE 100 UNIT/ML ~~LOC~~ SOLN: 45.0000 [IU] | Freq: Every day | SUBCUTANEOUS | Status: DC

## 2011-01-13 NOTE — Assessment & Plan Note (Signed)
Pt endorses lightheadedness and falling one time (no LOC).  He feels unsteady on his feet.  Negative orthostatics.  No dizziness.  Negative Hallpike exam.  BP is mildly low, which may contribute to lightheadedness.  Pt just got a new refill of Diovan 160mg .  I will ask him to break this med in half and see if that improves lightheadedness.  Pt to rtc in 1 month for f/u.  If symptoms not improve, I may get MRI since he has tobacco use history and I am concerned for mets.  May also check CXR.  If negative cancer work up, I may refer pt to Physical Therapy again for fall prevention.

## 2011-01-13 NOTE — Assessment & Plan Note (Signed)
INR 2.0 today.  Pt is taking coumadin 5mg  daily.  He has history of not taking coumadin due to not being able to get his medications. He has been subtherapeutic many times.  Will recheck in 2 wks.

## 2011-01-13 NOTE — Patient Instructions (Signed)
Please come back in 2 weeks for coumadin clinic.  Please make appointment in 1 month for blood pressure, falling, and diabetes. Changes to Lantus: 45 units each night Changes to Diovan: 1/2 tablet each morning Warfarin: 1 daily.

## 2011-01-13 NOTE — Progress Notes (Signed)
Subjective:    Patient ID: [name], male    DOB: 02-07-1951, 60 y.o.   MRN: [id number]  HPI  60 y/o M with multiple medical conditions including CAD, HLD, HTN, DM, DVT, ischemic CM, systolic CHF with EF 20-25% who presents for follow up visit.    1)FALLS: Pt reports falling while talking to a friend approx. 2 weeks ago. According to pt, was talking to friend while outside at approx. 10AM when he felt "lightheaded" and "woozy". Sat down to compose himself and then stood back up and started walking with friend. Two minutes later he turned his body to talk to his friend, felt lightheaded and fell. Denies LOC, but reports next thing he remembered he was being picked back up. Denies palpitations, CP, numbness, spinning sensation, auras, or HA. Pt has had multiple episodes of lightheadedness recently, but this is the first fall related to this. Has fallen 3 other times in the past year more related to his "foot giving out".  2)DIABETES: Pt not checking BG because he ran out of strips. [facility] was a [facility] with [facility] and pt feels that he never got the strips that were supposed to be delivered. Last checked BG in Jan or Feb of this year and claims that BG was between 120-150 most of the time with occasional measurements in 200s. Pt reports taking Lantus regularly and is tolerating in well, however, reports that he will occasionally split the dose and take 20U in the AM and 20U in the PM because he noticed that he felt tired at times when taking the full 40U in the AM. Walking approx. 2 miles/day prior to recent prostate surgery in Feb and avoiding sweets in diet. Denies worsening polyuria/polydypsia.  Meds:  Lantus 40 units qpm Compliance: yes  Diet: eating more vegetables, trying to avoid sweets  Lightheadedness:occasionally    Dizziness:occasionally    Confusion :no   Shakiness:no   Abd  pain:no   Nausea:no    Vomiting:no  CBGs: does not check     3)DVT: History of RLE DVT  and is on chronic coumadin.  He states that he takes it everyday.  Pt has poor insight into his medical conditions and do not really know his medications.  INR 2.0  4)SUBUNGUAL HEMATOMA: Pt stubbed L big toe recently and noticed bruising underneath toenail. Now noticed that toenail has begun to fall off.  5)TINNITUS: Pt has had chronic "hissing" tinnitus for years that he describes as "unbearable". Notices it most in his own home, and doesn't think it is associated with sitting in a quiet room. Denies any loss of hearing or vertigo.   Review of Systems Per hpi     Objective:   Physical Exam GEN: vitals reviewed. NAD, appropriate throughout exam.  HEENT: [place]/AT. PERRLA. EOMI. Sclera white and clear. Ear canals and TMs clear bilaterally. No cervical lymphadenopathy. CV: RRR. No MRG. Pulm: CTAB. Normal WOB Extremities: Abrasion on left leg from recent fall on concrete. L big toenail with underlying hematoma and lifted off of nailbed with evidence of new nail growth underneath. No evidence of erythema, edema, or drainage. LE pulses weak. Trace pitting edema bilaterally, worse on the right. Skin: Warm, pink, moist. No rashes/lesions/breakdowns. Neuro: CN II-XII grossly intact. Normal finger-nose-finger and heal-to-shin testing. Dix-Hallpike maneuver negative. Negative rhomberg, but very unsteady on feet. Gait very unsteady even with assistance of cane. Proprioception decreased in big toe on left.    Orthostatic VS: Negative (Manual BP 100/65 sitting, 95/65  standing), however, pt was symptomatic with standing experiencing lightheadedness and dizziness  Labs: Hgb A1C: 9.5 INR: 2.0 TSH:    Assessment & Plan:  Pt is a 60 y/o with multiple chronic medical problems who presents for general f/u in the setting of a recent fall.  1)Falls - Pt fell while walking slowly. BP is a bit low in clinic today at 110/65, orthostatic VS negative, but feels lightheaded on standing and has unsteady gait.  Proprioception also affected on left. Ddx includes orthostatic hypotension vs. diabetic neuropathy vs. BPPV. Hx and exam most consistent with hypotension. - Decreased Diovan from 160mg  qday to 80mg  qday for concern for hypotension - Pt instructed to return for f/u to check on dizziness/lightheadedness, falls, and BP in 4 weeks  2)Diabetes - Still not well controlled today with Hgb A1C of 9.5, however improved from 10.2 at last visit (has been as high as 15 in past). Given history of extremely high A1C, pt's individualized goal is <8. - Increased Lantus to 45U every evening - Counseled on continuing exercise and diet measures for diabetes management - Encouraged pt to obtain strips to check BG once a day, but pt is reluctant as he feels he "does not have time for that"  3) DVT: INR at target today (2.0) - Continue warfarin 5mg  qday - Pt instructed to return to clinic in 2 weeks for INR check  4)SUBUNGUAL HEMATOMA - Wrapped big toe for comfort - Counseled to let toenail fall off naturally and new nail will grow in its place over the course of a few months - Counseled on the importance of continued diabetic foot checks and care

## 2011-01-13 NOTE — Assessment & Plan Note (Signed)
R subungal callus.  No signs of infection.  It is composed of dry hyperkeratotic skin.  Gave Rx for diabetic shoes to prevent ulcers/amputations.

## 2011-01-13 NOTE — Assessment & Plan Note (Signed)
A1C 9.5, which is improved from 10 in 09/2010, but not at goal.  Pt has been taking Lantus 40 units qhs.  Will increase this to 45 units.  Unfortunately pt does not check CBGs daily.  I am unable to monitor his progress to dose his Lantus.  I will continue to see pt monthly.  I hope that he will check CBGs daily so that we can adjust Lantus at next visit.

## 2011-01-27 ENCOUNTER — Ambulatory Visit (INDEPENDENT_AMBULATORY_CARE_PROVIDER_SITE_OTHER): Payer: Medicare Other | Admitting: *Deleted

## 2011-01-27 DIAGNOSIS — Z7901 Long term (current) use of anticoagulants: Secondary | ICD-10-CM

## 2011-01-27 DIAGNOSIS — I82409 Acute embolism and thrombosis of unspecified deep veins of unspecified lower extremity: Secondary | ICD-10-CM

## 2011-02-09 NOTE — Assessment & Plan Note (Signed)
[facility]                            CARDIOLOGY OFFICE NOTE   NAME:[name], [name]                        MRN:          [id number]  DATE:08/03/2007                            DOB:          03-18-1951    PRIMARY CARE PHYSICIAN:  None.   REASON FOR PRESENTATION:  Evaluate patient with ischemic cardiomyopathy.   HISTORY OF PRESENT ILLNESS:  Patient presents for followup of the above.  The last visit, I did add Norvasc 2.5 mg daily.  He says he has been  taking his blood pressure at home, and it has been in the 110s/50s.  He  has been feeling well.  He does occasionally get some chest discomfort  for which he takes nitroglycerin, and it goes away quickly.  He gets  dyspnea with exertion and is on chronic O2.  None of this has changed.  He has had no new symptoms.  He has had no new PND or orthopnea.  He has  had no accelerated pattern of his chest pain.  He has had no  palpitations, presyncope, or syncope.   PAST MEDICAL HISTORY:  1. Coronary artery disease (CABG on March 09, 2007, see note for      details).  2. Cardiomyopathy (EF approximately 30%).  3. Chronic obstructive pulmonary disease, O2 dependent.  4. Hypertension.  5. Hyperlipidemia.  6. Morbid obesity.  7. Obstructive sleep apnea (unable or unwilling to use CPAP).  8. Anxiety.  9. Spinal fusion.  10.Hypothyroidism.  11.Diabetes mellitus.  12.Tonsillectomy.   ALLERGIES/INTOLERANCES:  1. ACE caused cough.  2. ACTOS caused edema.   MEDICATIONS:  1. Sorbitol.  2. Novolog insulin.  3. Aspirin 325 mg daily.  4. Potassium.  5. Furosemide 40 mEq daily.  6. Diovan 320 mg daily.  7. Plavix 75 mg daily.  8. Levothyroid 100 mcg daily.  9. Isosorbide 120 mg daily.  10.Metoprolol 100 mg b.i.d.  11.Spironolactone 50 mg daily.  12.Digoxin 0.25 mg daily.  13.Norvasc 2.5 mg daily.   REVIEW OF SYSTEMS:  As stated in the HPI, otherwise negative for other  systems.   PHYSICAL EXAMINATION:   Patient is in no distress.  Blood pressure 140/88, heart rate 77 and regular.  HEENT:  Eyelids unremarkable.  Pupils are equal, round and reactive to  light.  Fundi not visualized.  NECK:  No jugular venous distention at 45 degrees.  Carotid upstroke  brisk and symmetric.  No thyromegaly.  LYMPHATICS:  No adenopathy.  LUNGS:  Decreased breath sounds without wheezing or crackles.  HEART:  PMI not displaced or sustained.  S1 and S2 within normal limits.  No S3, no S4, no clicks, rubs, or murmurs.  ABDOMEN:  Obese, positive bowel sounds.  Normal in frequency and pitch.  No bruits, rebound, guarding.  There are no midline pulsatile masses.  No organomegaly.  SKIN:  No rashes, no nodules.  EXTREMITIES:  Upper pulses 2+, dorsalis pedis 2+ bilaterally, mild-to-  moderate bilateral lower extremity edema.  NEUROLOGIC:  Grossly intact.   EKG:  Sinus rhythm, rate 77, axis within normal limits, intervals within  normal limits, no acute ST-T wave changes.   ASSESSMENT/PLAN:  1. Coronary artery disease:  No further cardiovascular testing is      suggested.  He will continue with risk reduction.  2. Cardiomyopathy:  I think he seems to be relatively euvolemic at      this point.  I will continue on the medical regimen as listed.  3. Hypertension:  Blood pressure seems to be well controlled at home.      He is going to change the batteries to make sure his cuff is      accurate.  He will continue on medicines as listed.  4. Laboratory:  He was up to date with his labs in August.  I will not      test him again today.  5. Followup:  I will see him back in six months or sooner if needed.      He should follow up with a primary care doctor for management of      his diabetes and routine lab work between now and then.     [name], MD, [facility]  Electronically Signed    JH/MedQ  DD: 08/03/2007  DT: 08/03/2007  Job #: [id number]   cc:   [name]

## 2011-02-09 NOTE — Assessment & Plan Note (Signed)
[facility]                            CARDIOLOGY OFFICE NOTE   NAME:[name], [name]                        MRN:          [id number]  DATE:02/10/2007                            DOB:          06/22/1951    PRIMARY CARDIOLOGIST:  Dr. Angelina Sheriff.   PATIENT PROFILE:  A 60 year old Caucasian male, prior history of CAD and  ischemic cardiomyopathy presents to the office today as part of the  Horizon study followup with complaints of chest pain.   PROBLEM LIST:  1. Coronary artery disease.      a.     Jan 29, 2006, acute inferior ST segment elevation myocardial       infarction.      b.     Jan 29, 2006, cardiac catheterization revealing left main       normal, left anterior descending 90%, mid D1 90% ostial and       proximal, left circumflex was a moderate-sized vessel.  The       posterior lateral branches, or PL1 had a 95% stenosis, while the       PL2 has 95% and 90% stenoses in the proximal and mid portions.       Right coronary artery 95% distal.  Ejection fraction 35%.  The       distal right coronary artery was stented with a 2.25 x 16 mm       Horizon study stent.      c.     Feb 01, 2006, percutaneous coronary intervention and stenting       of the mid left anterior descending with placement of a 2.5 x 16       mm Taxus drug-eluting stent.      d.     October 10, 2006, cardiac catheterization revealing patent       stents of the left anterior descending and right coronary artery.       However, significant progression of the apical left anterior       descending disease now to 95%.  Ejection fraction was 35%, and the       patient was medically managed.  2. Ischemic cardiomyopathy.  Ejection fraction 35% by cath, October 07, 2006.  3. Hypertension.  4. Hyperlipidemia.  5. Chronic obstructive pulmonary disease on chronic home O2.  6. Morbid obesity.  7. Obstructive sleep apnea, unwilling to use continuous positive      airway  pressure.  8. Anxiety.  9. History of spinal fusion.   HISTORY OF PRESENT ILLNESS:  A 60 year old Caucasian male with the above  problem list who presented today for Horizon study followup.  Upon  questioning, he reported that about 3 weeks ago he had an episodic chest  discomfort that started about 1:30 in the morning that he ranked as 10  out of 10, with the sensation of someone hitting him with a 2 x 4, or a  gorilla sitting on his chest.  This initially occurred while rolling  from his left to his right to get out of  bed.  Symptoms were worsened  with deep breathing or position changes, and he was able to fall back to  sleep once he went into his living room on his recliner.  When he awoke  6 hours later, he continued to have the discomfort with any movement or  deep breathing.  Then he says finally he just sort of forgot about it  and it went away.  He has not had any pain like that since, although he  does intermittently experience chest discomfort.   From a dyspnea standpoint, he stays short of breath and uses oxygen at  all times.  He can generally rake leaves in his yard for about 5-10  minutes prior to having to sit down.  Overall, he thinks he is able to  be more active today than a year ago.  His chest pain does worry him  some, though.   CURRENT MEDICATIONS:  1. Sorbitol 0.7 1 tsp b.i.d.  2. Insulin 70/30 as directed.  It looks like 110 units in the a.m. 50      units in the p.m. daily.  3. Aspirin 325 mg daily.  4. K-Dur daily.  5. Lasix 80 mg in the a.m. 40 mg in the p.m.  6. Diovan 320 mg daily.  7. Plavix 75 mg daily.  8. Levothyroxine 100 mcg daily.  9. Isosorbide 120 mg daily.  10.Metoprolol 100 mg in the a.m. 50 mg in the p.m.  11.Spironolactone 50 mg daily.  12.Lipitor 80 mg nightly.  13.Digoxin 0.25 mg daily.   PHYSICAL EXAMINATION:  Blood pressure 140/88, heart rate 82,  respirations 16.  His weight is 282 pounds, down from 296 in January.  A pleasant  white male in no acute distress.  Awake, alert, and oriented  x3.  NECK:  Obese and difficult to assess JVP.  There are no bruits.  LUNGS:  Respiration is regular and labored, clear to auscultation.  CARDIAC:  Regular, S1, S2, no S3, S4, murmurs.  ABDOMEN:  Obese, soft, nontender, nondistended.  Bowel sounds present  x4.  EXTREMITIES:  Warm, dry, and pink.  No clubbing or cyanosis.  There is 1-  2+ bilateral lower extremity edema above the level of his elasticized  sweatpant legs.  Distal pulses are 1+.   ACCESSORY CLINICAL FINDINGS:  EKG shows sinus rhythm, no acute ST-T  changes.   ASSESSMENT AND PLAN:  1. Coronary artery disease/chest pain.  The patient reports that he      does have episodes of chest discomfort that were previously      relieved with nitroglycerin until he ran out of his nitroglycerin.      He also had an episode of discomfort that worried him quite a bite,      occurring 3 weeks ago.  That was more atypical in nature.  He      clearly reports pleuritic and musculoskeletal components to that      pain.  As noted, though, he is fairly concerned about the amount of      chest discomfort that he has, although, at the same time, says that      he feels as though he is doing better this year than he was last      year.  As he has had stents placed in his right coronary artery and      left anterior descending, will obtain a dobutamine Myoview to rule      out ischemia.  His last catheterization was  in January which showed      patent left anterior descending and right coronary artery stents      with progression of apical left anterior descending disease.  He      remains on aspirin, Plavix, beta blocker, nitrate, statin, and      angiotensin receptor blocker therapy.  2. Hypertension.  Blood pressure is elevated today.  He is not      checking it at home.  Will increase his metoprolol to 100 mg b.i.d. 3. Ischemic cardiomyopathy.  He does have lower extremity edema  which      is chronic, and from his description sounds as though it is more a      venous insufficiency issue than a heart failure issue.  He      otherwise appears euvolemic, and his lungs are clear.  He remains      on beta blocker, angiotensin receptor blocker, spironolactone,      digoxin therapy, and is tolerating all well.  Titrating beta      blockers today as above.  4. Obesity.  The patient is encouraged to become more active which he      says he is trying to do.  5. Chronic obstructive pulmonary disease.  This is followed by      pulmonology and he remains on home O2.  6. Hyperlipidemia.  Continue statin therapy.  7. Hypothyroidism.  This is followed by primary care and he takes      Synthroid.  8. Disposition.  The patient will follow up with Dr. Antoine Poche as      scheduled.  After some discussion, he has opted for a dobutamine      Myoview to rule out ischemia.      Nicolasa Ducking, ANP  Electronically Signed      Madolyn Frieze. Jens Som, MD, [facility]  Electronically Signed   CB/MedQ  DD: 02/10/2007  DT: 02/10/2007  Job #: [id number]

## 2011-02-09 NOTE — Consult Note (Signed)
NAME:  [name], [name]                 ACCOUNT NO.:  [id number]   MEDICAL RECORD NO.:  [id number]          PATIENT TYPE:  INP   LOCATION:  4733                         FACILITY:  [facility]   PHYSICIAN:  Wayne A. Sheffield Slider, M.D.    DATE OF BIRTH:  [birth year]   DATE OF CONSULTATION:  05/15/2008  DATE OF DISCHARGE:  05/15/2008                                 CONSULTATION   PRIMARY SERVICE:  [facility].   We are consulting for diabetes.   CHIEF COMPLAINT:  Diabetes, hypoglycemic episodes.   The patient is a 60 year old male with a history of coronary artery  disease, uncontrolled diabetes, CHF with EF of 25%, hypothyroidism, and  chronic lung disease on home oxygen.  He was admitted to [facility] on May 12, 2008, for a non-ST elevation  MI.  He was started on heparin and then catheterized on May 14, 2008.  He went rafting on Sunday with his friends, and he became very short of  breath.  He left his oxygen at home that day.  He eventually then  developed some chest pain.  There was question of right lower lobe  pneumonia and cellulitis of his right leg initially per cards note, but  both seem to be resolving.  His catheterization on May 14, 2008,  showed an EF of 20-25%, severe distal coronary artery disease in the LAD  totally occluded, unchanged from prior, moderate disease in the proximal  RCA, ostium of the OM type lesion.  I could consider PCI, but otherwise  medical management will be performed.  We have been consulting for  diabetes management.  The patient is on NovoLog 70/30 at home, but has  been without this since Thursday.  He evidently has been getting refills  via the mail for the past few years without having needed a  prescription.  He was on b.i.d. dosing in 2006 per our records, but now  he is only taking 80 units of this daily.  He is having hypoglycemic  episodes here in the hospital after being n.p.o. for his catheterization  to  the 40s and 70s.  His most recent fasting blood sugar was 225.  He  does have sweats accompanied by the hypoglycemic episodes.  He did  receive NovoLog 70/30 yesterday morning and the morning prior to that,  however, has not received any today.  The patient has not been seen by  our clinic since June 2007.  We do not have a current medication list  for him.  There is some confusion because the patient does not know what  medications he is exactly taking and has been out of some of them for a  while.  He had an appointment at our clinic yesterday to get med  refills, but was in the hospital and unable to make to this appointment.   ALLERGIES:  ACE INHIBITORS causes a cough and ACTOS causes swelling.   PAST MEDICAL HISTORY:  1. CHF.  2. Ischemic cardiomyopathy, EF of 20-25% on cath this month.  3. COPD, on chronic home O2.  4. Hypertension.  5. Diabetes.  6. Hypolipidemia.  7. Obesity.  8. Sleep apnea, but noncompliant with CPAP.  9. Hypothyroidism.  10.Coronary artery disease with a non-ST elevation MI during this      hospitalization.  11.He also has had a history of another MI in 2007 with stent      placement to the RCA and LAD.  12.Anxiety.  13.EKG during this hospitalization shows a left bundle branch block.   HOSPITAL MEDICATIONS:  1. Zocor 40 mg daily.  2. Aspirin 325 mg daily.  3. Plavix 75 mg daily.  4. Benicar 40 mg daily.  5. Spironolactone 50 mg daily.  6. Metoprolol 100 mg b.i.d.  7. Imdur 120 mg daily.  8. NovoLog 70/30, 80 units b.i.d.  9. Sliding-scale insulin moderate.  10.Synthroid 100 mcg daily.  11.Digoxin 0.25 mcg daily.  12.Norvasc 5 mg daily.  13.Lasix 40 mg 2 times a day.  14.Avelox 400 mg daily.  15.Sorbitol p.r.n. constipation.   PAST SURGICAL HISTORY:  History of catheterization, history of stent  placement, lumbar spinal fusion in 1987, tonsillectomy as a child,   Family History: sister with breast cancer.   SOCIAL HISTORY:  He is a  disabled Naval architect.  He lives in a mobile  home.  He has limited financial resources.  He has been a smoker in the  past.  He lives alone.  He is divorced, with 1 grown child, and he does  not know where she is at.   PHYSICAL EXAMINATION:  VITAL SIGNS:  Temperature 97.5, pulse rate 63,  respiratory rate 20, blood pressure 143/92, and oxygen saturation 95% on  2 L of oxygen.  GENERAL:  He is alert and in no acute distress.  EYES:  Extraocular muscles intact.  Pupils equal, round, and reactive to  light.  OROPHARYNX:  No erythema or exudates.  NECK:  No lymphadenopathy.  CARDIOVASCULAR:  Heart regular rate and rhythm.  No murmurs, rubs, or  gallops.  LUNGS:  Clear to auscultation bilaterally.  ABDOMEN:  Soft, nondistended, nontender, and normoactive bowel sounds.  EXTREMITIES:  Trace to 1+ edema bilaterally. Mild pedal erythema but no  warmth   ASSESSMENT AND PLAN:  The patient is a 60 year old male with coronary  artery disease here with recent non-ST elevation myocardial infarction,  uncontrolled diabetes, congestive heart failure with an ejection  fraction of 25%, and chronic lung disease here for an non-ST elevation  myocardial infarction.  We are consulting for diabetic management.  1. Diabetes.  Uncontrolled. AIC 13. The patient has not been seen in      our office since 2007.  He has been getting his insulin via mail      order and finally he has run out of refills.  We would be happy to      see him in our clinic.  He is currently on an inappropriate dose of      Novolin 70/30, usually takes this twice a day.  He is only taking      this once a day.  We will switch him over to Lantus 15 units daily      since this would be a one-time-a-day dose and we can increase this      in clinic and also add mealtime coverage if necessary.  With his      current weight, he probably needs up to 50 units of Lantus for his      body habitus.  We will have him  check his fasting blood sugar  each      morning at home and increase his Lantus every time his fasting      sugar is greater than 140.  We advised him to check his fasting      sugars daily.  He could likely go home today with close followup      for his diabetes.  I did discuss this with Dr. Sheffield Slider, our attending.      He will see him prior to his discharge.  The patient will follow up      with [place] for further titration on Lantus and diabetic medications.      An appointment was made for Wednesday, May 22, 2008, at 11:25      with Dr. Sylvan Cheese.  Disposition per Cardiology.  2. Nonvesicular rash on abdomen.  Appears to possibly have some      satellite lesions, though seems a Streck bit drier than yeast.  We      will try nystatin cream.  We will try this 3 times a day.  If this      is not working, when he comes back to the clinic, we will consider      hydrocortisone cream for atopic dermatitis.  3. Hypertension.  Decent control though some blood pressures are a      Morrical high for a diabetic.  We will let Cardiology manage this.      Currently, he is on metoprolol, Norvasc, Imdur, spironolactone, and      Benicar.  4. Hypothyroidism.  His TSH is within normal limits.  We will continue      his Synthroid here.  5. Coronary artery disease/non-ST elevation myocardial infarction.      Medical management per Cardiology.  He is currently on aspirin,      Zocor, Plavix, metoprolol, and Imdur.  6. Congestive heart failure with an ejection fraction of less than      50%.  Recent catheterization showing an ejection fraction of 20-      25%.  We will continue his digoxin, spironolactone, Lasix, Imdur,      and metoprolol per Cardiology.      Alanda Amass, M.D.  Electronically Signed      Wayne A. Sheffield Slider, M.D.  Electronically Signed    JH/MEDQ  D:  05/15/2008  T:  05/16/2008  Job:  [id number]

## 2011-02-09 NOTE — Assessment & Plan Note (Signed)
[facility]                            CARDIOLOGY OFFICE NOTE   NAME:LITTLEKhamauri, Bauernfeind                        MRN:          [id number]  DATE:10/17/2008                            DOB:          June 29, 1951    PRIMARY CARE PHYSICIAN:  Sylvan Cheese, MD   REASON FOR PRESENTATION:  Evaluate the patient with systolic-diastolic  heart failure.   HISTORY OF PRESENT ILLNESS:  The patient is a pleasant 60 year old  gentleman who presents for followup of the above.  It has been about 4  months since I last saw him.  He has done relatively well.  He has lost  7 pounds!  He is trying to watch his diet.  He is restricting his salt  and his fluid.  He is taking his medications.  He has not required any  ER visits or hospitalizations.  He has had some chest pain and  occasionally takes 1 or 2 nitroglycerin.  This has been a stable  pattern.  Nitroglycerin sublingual does treat this discomfort.  He  states he has used about 15 nitroglycerin in the last 3-4 months, which  is not an increased pattern for him.  He is having no new shortness of  breath, but has his chronic dyspnea, O2 dependent.  He does not have any  PND or orthopnea.  Occasionally, he gets extra volume and takes an extra  diuretic.   PAST MEDICAL HISTORY:  1. Coronary artery disease (history of an inferior myocardial      infarction.  Stent placement to the right coronary artery in May      2007.  He also has stenting to his LAD and a cutting balloon of the      diagonal.  The most recent catheterization in June 2008      demonstrated distal obstructive LAD disease, but otherwise no acute      disease.)  2. Diabetes mellitus.  3. Chronic obstructive lung disease (asthma versus bronchitis, O2      dependent).  4. Obstructive sleep apnea.  5. Ischemic cardiomyopathy (EF approximately 30%).  6. Hypothyroidism.  7. Hyperlipidemia.  8. Hypertension.  9. Morbid obesity.  10.Anxiety.  11.Spinal fusion.  12.Fournier Gangrene (status post surgical debris in January 2006).   ALLERGIES:  ACE caused cough and ACTOS caused edema.   MEDICATIONS:  1. Klor-Con 20 mEq b.i.d.  2. Furosemide 40 mg b.i.d.  3. Lantus 100 units daily.  4. Lopressor 50 mg b.i.d.  5. Norvasc 5 mg daily.  6. Lipitor 40 mg daily.  7. Imdur 60 mg daily.  8. Aspirin 81 mg daily.  9. Digoxin 0.25 mg daily.  10.Spironolactone.  11.Levothroid 100 mcg daily.  12.Plavix 75 mg daily.  13.Diovan 320 mg daily.   REVIEW OF SYSTEMS:  As stated in the HPI, and otherwise negative for  other systems.   PHYSICAL EXAMINATION:  GENERAL:  The patient is in no distress.  VITAL SIGNS:  Blood pressure 130/81, heart rate 59 and regular, weight  250 pounds, and body mass index 36.  HEENT:  Eyelids unremarkable; pupils equal,  round, and react to light;  fundi not visualized, oral mucosa unremarkable.  NECK:  No jugular venous distention at 45 degrees; carotid upstroke  brisk and symmetric; no bruits, no thyromegaly.  LYMPHATICS:  No cervical, axillary, or inguinal adenopathy.  LUNGS:  Clear to auscultation bilaterally.  BACK:  No costovertebral angle tenderness.  CHEST:  Unremarkable.  HEART:  PMI not displaced or sustained; S1 and S2 within normal limits;  no S3, no S4; no clicks, no rubs, no murmurs.  ABDOMEN:  Morbidly obese;  positive bowel sounds; normal in frequency and pitch; no bruits, no  rebound, no guarding; no midline pulsatile mass; no hepatomegaly, no  splenomegaly.  SKIN:  No rashes.  No nodules.  EXTREMITIES:  Pulses 2+ throughout; mild bilateral lower extremity  edema, no cyanosis, no clubbing.  NEURO:  Oriented to person, place, and time; cranial nerves II through  XII grossly intact, motor grossly intact.   EKG; sinus rhythm, rate 59, left bundle-branch block, and no acute ST-T  wave changes.   ASSESSMENT AND PLAN:  1. Systolic-diastolic heart failure.  The patient appears to be      euvolemic.  He is  watching salt and fluid.  He takes p.r.n. Lasix      as needed.  He has had electrolytes done late last month and I      reviewed these.  His renal function was fine.  His potassium was      within normal limits.  At this point, no further therapy is      warranted.  2. Dyslipidemia.  I did review his labs.  His total cholesterol is      109, triglycerides 108, HDL 26, and an LDL of 61.  Since his HDL is      still low, I am going to increase his fenofibrate to 145 mg daily.      He will be given written instructions for a lipid and liver profile      in 10 weeks.  3. Coronary artery disease.  He is having stable angina pattern.  He      medicates this well with nitroglycerin.  At this point, no change      to his regimen is indicated, he will let me know if he has any      increasing pattern and come to the emergency room with any      sustained symptoms.  4. Morbid obesity.  I am very proud of the way he is losing weight and      encouraged more of this gradual consistent weight loss.  5. Hypothyroidism per his primary care physician.  His last TSH was      slightly elevated.  6. Followup.  I will see him back in 6 months or sooner if needed.     [name], MD, [facility]  Electronically Signed    JH/MedQ  DD: 10/17/2008  DT: 10/18/2008  Job #: [id number]   cc:   Sylvan Cheese, M.D.

## 2011-02-09 NOTE — Assessment & Plan Note (Signed)
[facility]                             PULMONARY OFFICE NOTE   NAME:LITTLEDaejon, Lich                        MRN:          [id number]  DATE:08/10/2007                            DOB:          May 21, 1951    A 60 year old white male former smoker with morbid obesity and  documented ischemic cardiomyopathy with an ejection fraction of 30%.  He  says he has been about the same for the last three years with dyspnea  with minimal activity and remains O2 dependent with 2 liters at rest.  With ambulation, he is short of breath just walking the 50 feet needed  to get to the exam room and desaturated to 80% today on 2 liters.  He  says this is about the same as usual.  He denies any pleuritic or  exertional chest pain, orthopnea, PND, or leg swelling.   For a full inventory of medications, please see medication face sheet,  dated August 10, 2007.  Note, the patient is not keeping up with the  medication calendar as requested and does not know the names of his  medicines.   PHYSICAL EXAMINATION:  He is a depressed-appearing ambulatory white male  with somewhat of a hopeless, helpless affect and attitude.  He is afebrile with normal vital signs.  Blood pressure 124/74.  HEENT:  Unremarkable.  Oropharynx is clear.  Lung fields are clear bilaterally.  There is a distant S1 and S2.  ABDOMEN:  Massive obese.  Benign.  EXTREMITIES:  Warm without calf tenderness, clubbing, cyanosis.  There  was trace edema.   Chest x-ray revealed cardiomegaly but no significant infiltrates or  effusion.   IMPRESSION:  This patient actually has relatively well compensated  congestive heart failure on the present regimen that was outlined by Dr.  Antoine Poche.  The problem is, he really does not know the names of his  medicines and has no organized way to take the medications, although he  was provided with a very user-friendly and unambiguous strategy that he  refuses to use as  suggested.  Note, although he does have both albuterol  HFA and albuterol nebulizer, he says that he does no better with it than  without it; therefore, uses it rarely.   In this setting, there is very Melfi we can do for him here in the  pulmonary clinic, but I do not believe that he needs regular pulmonary  followup anyway.  The most important issue is to try to help this man  lose weight and keep up with his medications in a more coherent fashion.   The one additional recommendation I made today was to increase oxygen up  to 4 liters with exercise, otherwise maintain at 2 liters at rest.   Followup needs to be through the [facility] teaching  service and through Dr. Jenene Slicker office with pulmonary followup on a  p.r.n. basis only.  (I gave him the tip that if he  begins to notice that albuterol helps more than just resting whenever he  is short of breath, it is  okay to use it that way but not on a regular basis.  If he finds he is  using albuterol more regularly to help him breathe better, then I need  to see him here in the pulmonary clinic).     Charlaine Dalton. Sherene Sires, MD, [facility]  Electronically Signed    MBW/MedQ  DD: 08/10/2007  DT: 08/11/2007  Job #: [id number]

## 2011-02-09 NOTE — Assessment & Plan Note (Signed)
[name]                            CARDIOLOGY OFFICE NOTE   [name], [name]                        MRN:          [id number]  DATE:03/09/2007                            DOB:          Nov 12, 1950    PRIMARY CARE PHYSICIAN:  Ursula Beath, M.D.   REASON FOR VISIT:  Evaluate patient with ischemic cardiomyopathy.   HISTORY OF PRESENT ILLNESS:  The patient presents for followup of the  above.  He was last hospitalized in January 2008.  At that time he had  acute on chronic systolic heart failure.  He did have a cardiac  catheterization with disease as described below.  He saw the P.A. in  May.  He did have some chest discomfort.  Because of this he did have a  dobutamine Myoview.  This demonstrated his ejection fraction to be  approximately 24%.  There was an inferior scar with mild peri-infarct  ischemia.  There was also anterior apical defects.  This seemed to be  more extensive than previously.   The patient is not having any new symptoms.  He will occasionally get  chest discomfort.  He has not had any sublingual nitroglycerin.  He  doesn't think this pattern is any more than he has had for quite a  while.  This happens sporadically.  It is similar to his previous  angina.  It is substernal.  He does not have any new shortness of  breath. He is chronically on O2.  He has no PND or orthopnea.  He has no  palpitations, presyncope or syncope.   PAST MEDICAL HISTORY:  1. Coronary artery disease (catheterization January 2008 - the left      main was normal, the left anterior descending had a 40% stenosis at      the area of prior cutting balloon angioplasty.  There was a drug-      eluting stent, which was widely patent, the distal left anterior      descending had severe diffuse 95% stenosis which was progressed      from 2007.  The circumflex was a moderate sized vessel with 70%      stenosis in the AV grove.  The right coronary artery was  patent      with a previously placed stent.  The ejection fraction was 35%.  It      was felt that the patient needed medical management).  2. Chronic obstructive pulmonary disease, O2 dependent.  3. Hypertension.  4. Hyperlipidemia.  5. Morbid obesity.  6. Obstructive sleep apnea, (unable or unwilling to use CPAP).  7. Anxiety.  8. Spinal fusion.  9. Hypothyroidism.  10.Diabetes mellitus.  11.Tonsillectomy.   ALLERGIES/INTOLERANCES:  ALTACE causes cough.  ACTOS causes edema.   REVIEW OF SYSTEMS:  As stated in the HPI and otherwise is negative for  other systems.   PHYSICAL EXAMINATION:  GENERAL APPEARANCE:  The patient is in no  distress.  He is chronically ill-appearing.  VITAL SIGNS:  Blood pressure 167/94, heart rate 85 and regular.  Weight  276 pounds (down 6 pounds).  HEENT:  Eyelids unremarkable.  Pupils equal, round, reactive to light.  Fundi not visualized.  Oral mucosa is unremarkable.  NECK:  No jugular venous distention at 45 degrees.  Carotid upstrokes  brisk and symmetric.  No bruits.  No thyromegaly.  LYMPHATICS:  No cervical, axillary or inguinal adenopathy.  LUNGS:  Clear to auscultation bilaterally.  BACK:  No costovertebral angle tenderness.  CHEST:  Unremarkable.  HEART:  PMI not displaced or sustained.  S1 and S2 within normal limits  with no S3 or S4.  No clicks, no rubs.  ABDOMEN: Obese.  Positive bowel sounds.  Normal in frequency and pitch.  No bruits, rebound, guarding, no midline pulsatile mass or organomegaly.  SKIN:  No rashes, nodules.  EXTREMITIES:  2+ upper pulses, 1+ dorsalis pedis pulse and posterior  tibialis bilaterally, moderate bilateral lower extremity edema.  NEUROLOGICAL:  Grossly intact.   ASSESSMENT/PLAN:  1. Coronary artery disease.  The patient has coronary artery disease      with an abnormal Cardiolite. His ejection fraction by      catheterization and echocardiogram in December was in the 30 to 35%      range which is  consistent with previous.  I believe the Cardiolite      may be under-estimating it.  He has ischemia that is increased and      in the distribution of the distal left anterior descending which is      diffusely diseased and not amenable to revascularization.      Otherwise, the fixed with partial reversible defect in the inferior      wall is essentially unchanged.  In the absence of any new symptoms      since his January catheterization, I would not suggest another      catheterization at this time but will manage him medically.  2. Hypertension.  The patient is on a significant medical regimen.      His blood pressure is still slightly elevated.  I have asked him to      try to keep this at home to see if it is truly as elevated.  We      will have to make further considerations such as adding Norvasc. He      is reluctant to take even more medications.  3. Risk reduction.  The patient will continue on Lipitor.  I will      defer to Dr. Everardo All.  Goal is an LDL less than 70 and HDL in the      40's.  4. Followup.  I would like to see him back in a couple of months or      sooner if he has any increasing symptoms.     [name], MD, [facility]  Electronically Signed   JH/MedQ  DD: 03/09/2007  DT: 03/10/2007  Job #: [id number]   cc:   Ursula Beath, MD  Cleophas Dunker Everardo All, MD

## 2011-02-09 NOTE — Assessment & Plan Note (Signed)
[facility]                            CARDIOLOGY OFFICE NOTE   NAME:[name], [name]                        MRN:          [id number]  DATE:05/30/2008                            DOB:          1951-01-27    PRIMARY CARE PHYSICIAN:  Sylvan Cheese, MD   REASON FOR PRESENTATION:  Evaluate the patient, recent hospitalization  for acute on chronic systolic and diastolic heart failure.   HISTORY OF PRESENT ILLNESS:  The patient is a 60 year old gentleman who  was hospitalized from May 12, 2008 to May 15, 2008 with dyspnea.  He had gone on a rafting trip and had not worn his oxygen, and got  acutely shortness of breath.  He actually did have a very slight  troponin bump when he was admitted.  He had some chest discomfort.  He  was managed medically for his known coronary disease.  He was treated  for empiric pneumonia, as he had a chest x-ray, which suggested this  could possibly be the etiology.  He also had diuresis.   Since going home, he has done much better.  He has been able to walk a  Rolison bit more with his walker.  He has kept the fluid in his legs  down.  He is trying to watch salt and fluid.  He has had some chest  discomfort about 4 days ago and took some nitroglycerin, but none since  then.  He has had no severe substernal chest pressure, neck, or arm  discomfort.  He is not describing any palpitation, presyncope, or  syncope.  He has had no PND or orthopnea.  Overall, he thinks he is  feeling well.   PAST MEDICAL HISTORY:  1. Coronary artery disease (history of an inferior myocardial      infarction.  Stent placement to the right coronary artery in May      2007.  He also has stenting to his LAD and cutting balloon over the      diagonal.  Most recent catheterization in January 2008 demonstrated      distal obstructive LAD disease, but otherwise no acute disease).  2. Diabetes mellitus.  3. Chronic lung disease (asthma versus bronchitis,  O2 dependent).  4. Obstructive sleep apnea.  5. Ischemic cardiomyopathy (EF approximately 30%).  6. Hypothyroidism.  7. Hyperlipidemia.  8. Hypertension.  9. Morbid obesity.  10.Anxiety.  11.Spinal fusion.  12.Fournier gangrene, status post surgical debridement back in January      2006.   ALLERGIES:  ACE causes cough and ACTOS causes edema.   MEDICATIONS:  1. Lantus.  2. Lopressor 50 mg b.i.d.  3. Norvasc 5 mg daily.  4. Lipitor 40 mg daily.  5. Imdur 60 mg daily.  6. Aspirin 81 mg daily.  7. Digoxin 0.25 mg daily.  8. Spironolactone 50 mg daily.  9. Levothyroxine 100 mcg daily.  10.Plavix 75 mg daily.  11.Diovan 320 mg daily.  12.Furosemide 40 mg daily.   REVIEW OF SYSTEMS:  As stated in the HPI.  Otherwise, negative for all  other symptoms.   PHYSICAL EXAMINATION:  GENERAL:  The patient is in no distress.  VITAL SIGNS:  Blood pressure 128/78, heart rate 72 and regular, weight  257 pounds, and body mass index 38.  HEENT:  Eyes, unremarkable.  Pupils equal, round, and reactive to light.  Fundi, not visualized.  Oral mucosa, unremarkable.  NECK:  No jugular venous distention at 45 degrees.  Carotid upstroke  brisk and symmetric.  No bruits.  No thyromegaly.  LYMPHATICS:  No cervical, axillary, or inguinal adenopathy.  LUNGS:  Decreased breath sounds without dullness to percussion,  wheezing, or crackles.  BACK:  No costovertebral angle tenderness.  CHEST:  Unremarkable.  HEART:  PMI, not displaced or sustained.  S1 and S2 within normal  limits.  No S3.  No S4.  No clicks.  No rubs.  No murmurs.  ABDOMEN:  Morbidly obese.  Positive bowel sounds.  Normal in frequency  and pitch.  No bruits.  No rebound.  No guarding.  No midline pulsatile  mass.  No organomegaly.  SKIN:  No rashes.  No nodules.  EXTREMITIES:  Pulses 2+.  Moderate right greater than left lower  extremity edema.  NEUROLOGIC:  Oriented to person, place, and time.  Cranial nerves II  through XII grossly  intact.  Motor, grossly intact.   ASSESSMENT AND PLAN:  1. Acute on chronic systolic and diastolic heart failure.  The patient      appears to be euvolemic, except for some mild lower extremity      swelling.  He is breathing much better.  Tolerating the med regimen      as listed.  He is due to have labs done in a week or so by his      primary care physician.  At this point, he will continue with salt      and fluid restrictions, and the meds as listed.  2. Coronary artery disease.  He has non-Q-wave MI, probably secondary      event rather than a primary problem.  He is not having any further      angina.  We are going to continue to manage this medically and with      risk reduction.  3. Dyslipidemia.  His lipids were not checked in the hospital.  We      will defer management of this to his primary care doctor.  I think      the goal should be an LDL less than 70 and HDL greater than 40.  4. Obesity.  He has continued to lose weight and I applied his efforts      on this.  5. Followup.  We will see him back in 4 months' or sooner if needed.     [name], MD, [facility]  Electronically Signed    JH/MedQ  DD: 05/30/2008  DT: 05/31/2008  Job #: [id number]   cc:   Sylvan Cheese, M.D.

## 2011-02-09 NOTE — H&P (Signed)
[name], COLSON NO.:  [id number]   MEDICAL RECORD NO.:  [id number]          PATIENT TYPE:  INP   LOCATION:  2927                         FACILITY:  [facility]   PHYSICIAN:  Bevelyn Buckles. Bensimhon, MDDATE OF BIRTH:  [birth year]   DATE OF ADMISSION:  12/25/2008  DATE OF DISCHARGE:                              HISTORY & PHYSICAL   PRIMARY CARE PHYSICIAN:  The patient's primary care physician is Sylvan Cheese, M.D. with [name].   CARDIOLOGIST:  [name], M.D., [facility].   REASON FOR ADMISSION:  Acute on chronic systolic heart failure.   HISTORY OF THE PRESENT ILLNESS:  The patient is a 60 year old male with  multiple medical problems including obesity, diabetes, obstructive sleep  apnea, coronary artery disease status post previous inferior wall  myocardial infarction, and congestive heart failure secondary to  ischemic cardiomyopathy with an ejection fraction of 30-35%.  He has  been followed closely by Dr. Antoine Poche in the Heart Failure Clinic.  He  reports about a 5-day history of progressive shortness of breath,  orthopnea, PND and severe lower extremity edema.  He initially took  several extra doses of Lasix, which helped for a [place] while, but he  continued to swell.  Tonight he got so short of breath that he could  barely get up a step and he called EMS.  He also notes that over the  past 2 days he has had a productive cough and occasional chills.   On arrival to the ER he was breathing 40 times a minute with O2 sat of  84%.  He was started on BiPAP, given Lasix 80 IV and given two  treatments with albuterol.  Chest x-ray showed pulmonary edema with the  question of a left lower lobe infiltrate.  There was a small right  effusion and a medium size left pleural effusion.  BNP was 624.  He had  a prompt response with about 1,206 mL of urine out and now he is feeling  better.   REVIEW OF SYSTEMS:  The review of systems is notable for fatigue  and  arthritic pain.  He denies any focal neurologic deficits.  No dysuria.  No abdominal pain.  No bleeding.  The remainder of the review of systems  is negative, except for that noted in the HPI and the problem list.   PROBLEM LIST:  1. Congestive heart failure.      a.     Echocardiogram in 2007 showed an ejection fraction of 30-       35%.  2. Coronary artery disease.      a.     Status post previous inferior wall myocardial infarction in       May 2007.      b.     Status post stent placement to the right coronary artery in       May 2007.      c.     Also he has a history of stenting of the left anterior       descending.  d.     Most recent catheterization in June 2008 demonstrated distal       obstructive left anterior descending disease, but otherwise no       acute disease.  The stents were patent.  3. Chronic obstructive pulmonary disease; oxygen-dependent.  4. Diabetes.  5. Morbid obesity.  6. Obstructive sleep apnea on continuous positive airway pressure.  7. Hypothyroidism.  8. Hyperlipidemia.  9. Hypertension.  10.Anxiety.  11.History of spinal fusion.  12.History of Fournier gangrene status post debridement in January      2006.  13.Left bundle-branch block.   MEDICATIONS:  Current medications include:  1. Aspirin 81 a day.  2. Plavix 75 a day.  3. Imdur 60 a day.  4. Lipitor 40 a day.  5. Synthroid 100 mcg a day.  6. Diovan 320 a day.  7. Spirolactone 50 a day.  8. Lopressor 50 twice a day.  9. Digoxin 0.25 a day.  10.Norvasc 5 a day.  11.Lasix 40 twice a day.  12.Potassium 20 once a day.  13.Nitrostat.  14.Lantus 22 units of insulin.   ALLERGIES AND INTOLERANCES:  ACE INHIBITORS CAUSE A COUGH AND ACTOS  CAUSES EDEMA.   SOCIAL HISTORY:  The patient is divorced.  He lives alone.  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  He has a history of tobacco use, but quit over  10 years ago.  No significant alcohol.   FAMILY HISTORY:  Mother; he does not know  her health problems.  Father  is unknown to the patient.  A sister had breast cancer.   PHYSICAL EXAMINATION:  GENERAL APPEARANCE:  On physical exam he is a  morbidly obese male in some distress.  He is lying in bed on a BiPAP  mask.  VITAL SIGNS:  Blood pressure is 135/88 and heart rate is 85.  He is  currently satting 100% on BiPAP.  HEENT:  The head, eyes, ears, nose, and throat are normal.  He has a  beard.  There is poor dentition.  NECK:  The neck is supple.  JVP is elevated.  Carotids are 2+  bilaterally.  Unable to assess for bruits over his breath sounds.  No  lymphadenopathy or thyromegaly present.  HEART:  Cardiac - PMI is not palpable.  He has distant heart sounds.  He  is regular with no obvious murmurs.  LUNGS:  The lungs have diffuse crackles one-third of the way up  bilaterally.  No wheezing.  ABDOMEN:  The abdomen is obese and nontender with mild distention.  I am  unable to appreciate hepatosplenomegaly.  No bruits or masses.  EXTREMITIES:  The extremities are warm with 3+ edema bilaterally.  Faint  distal pulses.  No rashes.  No cyanosis.  NEUROLOGIC EXAMINATION:  Neuro - he is alert and oriented.  His cranial  nerves II-XII are intact.  Moves all four extremities without  difficulty.  PSYCHIATRIC:  Affect is pleasant.   LABORATORY DATA:  White count is 8.5, hemoglobin is 12.2 and platelets  are 231,000.  Sodium 132, potassium 3.8, BUN 21, creatinine 0.84, and  glucose  307.  BNP is 624.  Digoxin is less than 0.02.  Point of care MB  is negative.  Troponin is 0.14.  EKG showed sinus rhythm at 66 with a  left bundle branch block; actually that was the old EKG.  EKG shows  sinus tachycardia at a hundred with a left bundle branch block.   ASSESSMENT:  1. Acute on  chronic systolic heart failure.  2. Ischemic cardiomyopathy with an ejection fraction of 30-35%.  3. Diabetes with elevated blood sugars.  4. Coronary artery disease status post previous stenting of the  right      coronary artery and left anterior descending.  5. Question left lower lobe infiltrate on chest x-ray.   PLAN AND DISCUSSION:  1. We will admit him to stepdown.  2. We will try to wean BiPAP.  3. We will diurese aggressively.  4. We will cover him with Avelox.  5. We will resume his insulin and sliding scale.      Bevelyn Buckles. Bensimhon, MD  Electronically Signed     DRB/MEDQ  D:  12/25/2008  T:  12/26/2008  Job:  [id number]

## 2011-02-09 NOTE — Discharge Summary (Signed)
[name], [name] NO.:  [id number]   MEDICAL RECORD NO.:  [id number]          PATIENT TYPE:  INP   LOCATION:  4733                         FACILITY:  [facility]   PHYSICIAN:  Juan Rotunda, MD, FACCDATE OF BIRTH:  April 28, 1951   DATE OF ADMISSION:  05/12/2008  DATE OF DISCHARGE:  05/15/2008                               DISCHARGE SUMMARY   PRIMARY CARDIOLOGIST:  Juan Rotunda, MD, [facility].   PRIMARY CARE [name]:  Dr. Sylvan Graham at [facility].   DISCHARGE DIAGNOSIS:  Chest pain.   SECONDARY DIAGNOSES:  1. Acute on chronic systolic congestive heart failure.  2. Ischemic cardiomyopathy with EF of 35%.  3. Elevated troponin likely secondary to congestive heart failure.  4. Hypertension.  5. Hyperlipidemia.  6. Poorly controlled type 2 diabetes mellitus.  7. Rule out pneumonia.  8. Leukocytosis.  9. Severe multivessel coronary artery disease not amenable to      percutaneous coronary intervention or coronary artery bypass graft.  10.Asthmatic bronchitis/chronic obstructive pulmonary disease on home      O2.  11.Hypothyroidism.  12.Obesity.  13.Obstructive sleep apnea with CPAP noncompliance.  14.Anxiety.  15.Status post spinal fusion.   ALLERGIES:  ACE INHIBITOR causes cough.  ACTOS has caused edema.   PROCEDURES:  Left heart cardiac catheterization.   HISTORY OF PRESENT ILLNESS:  A 60 year old Caucasian male with the above  extensive problem list.  He was in his usual state of health until  May 13, 2008, when he attempted to go on a rafting trip without his  O2.  He subsequently developed acute of dyspnea and substernal chest  discomfort with diaphoresis similar to previous angina although much  worse.  EMS was activated and the patient's pain was resolved with  nitroglycerin.  He was taken to the [facility] ED for further  evaluation.  In the ED his troponin was mildly elevated at 0.09 and EKG  showed sinus rhythm with a left bundle  branch block which has been known  to be intermittent in the past.  The patient was admitted for further  evaluation.   HOSPITAL COURSE:  The patient continued to have elevation of his  troponin to a peak of 0.19 despite normal CKs and MBs.  Admission chest  x-ray showed pulmonary edema and he was placed on intravenous Lasix at  40 mg b.i.d.  With diuresis he did have improvement symptomatically.  Given his extensive history of coronary artery disease with symptoms on  presentation it was felt that he would require cardiac catheterization  to re-evaluate coronary anatomy.  Catheterization was performed on  August 18 revealing severe three vessel coronary artery disease with  diabetic vasculopathy.  There were no specific targets for intervention  and anatomy was not felt to be amenable to bypass surgery.  The obtuse  marginal did have a 95% ostial stenosis which if the patient fails  medical therapy could be considered for percutaneous coronary  intervention.  Post catheterization the patient had no additional chest  discomfort.  He was now euvolemic on exam.   During this admission Mr.  Graham was noted to have leukocytosis with a  white count of greater than 14,000 on admission.  His admission chest x-  ray showed asymmetric air space process on the right side which could  possibly reflect pneumonia.  He reported a 2 week history of productive  cough prior to admission and we therefore initiated him on Avelox  therapy.  He tolerated this well with resolution of his leukocytosis and  cough.  He will be discharged home on Avelox and will complete 4  additional days.   [name]'s blood sugars have been elevated throughout admission.  Hemoglobin A1c was checked and elevated at 13.5.  We have asked or he  has been seen by the family practice teaching service from the [facility] outpatient clinic and they have initiated Lantus insulin with  instructions for titration and followup.  Mr.  Graham will be discharged  home today in good condition.   DISCHARGE LABS:  Hemoglobin 12.5, hematocrit 36.3, WBC 8.3, platelets  176, INR 1.0, sodium 139, potassium 3.7, chloride 107, CO2 27, BUN 22,  creatinine 1.04, glucose 225, total bilirubin 0.7, alkaline phosphatase  100, AST 17, ALT 22, total protein 6.2, albumin 3.5, calcium 8.5,  hemoglobin A1c 13.5, CK 140, MB 4.6, troponin I 0.16, BNP 338, total  cholesterol 90, triglycerides 98, HDL 23, LDL 47, TSH 1.437.   DISPOSITION:  The patient is being discharged home today in good  condition.   FOLLOWUP PLANS AND APPOINTMENTS:  Follow up with Dr. Sylvan Graham on  Wednesday August 26 at 11:25 a.m. at the [facility] outpatient clinic.  He is to follow up with Dr. Rollene Graham on September 3 at 3:30 p.m.   DISCHARGE MEDICATIONS:  1. Aspirin 81 mg daily.  2. Plavix 75 mg daily.  3. Imdur 60 mg daily.  4. Lantus 15 units daily at bedtime to be increased as directed.  5. Lipitor 40 mg daily at bedtime.  6. Synthroid 100 mcg daily.  7. Diovan 320 mg daily.  8. Spironolactone 50 mg daily.  9. Lopressor 100 mg b.i.d.  10.Digoxin 0.25 mg daily.  11.Norvasc 5 mg daily.  12.Lasix 40 mg b.i.d.  13.Avelox 400 mg daily x4 additional days.  14.K-Dur 40 mEq daily.  15.Nitroglycerin 0.4 mg sublingual p.r.n. chest pain.   OUTSTANDING LAB STUDIES:  None.   DURATION DISCHARGE ENCOUNTER:  Forty minutes including physician time.      [name], ANP      Juan Rotunda, MD, [facility]  Electronically Signed    CB/MEDQ  D:  05/15/2008  T:  05/15/2008  Job:  [id number]   cc:   [name], M.D.

## 2011-02-09 NOTE — Assessment & Plan Note (Signed)
[facility]                            CARDIOLOGY OFFICE NOTE   NAME:[name], [name]                        MRN:          [id number]  DATE:05/09/2007                            DOB:          [birth year]    PRIMARY CARE PHYSICIAN:  None.   REASON FOR PROCEDURE:  Evaluate patient with ischemic cardiomyopathy.   HISTORY OF PRESENT ILLNESS:  The patient presents for followup of the  above.  At the last visit, he was slightly hypertensive.  We had him  keep a blood pressure diary.  It does demonstrate that he is  consistently in the 160 systolic range.  It will even go up to the 190s  if he has emotional stress.  He has no new symptoms.  He did have 1  episode of chest discomfort requiring sublingual nitroglycerin when he  got in a verbal altercation.  This goes away quickly with the  nitroglycerin.  He is, otherwise, not having any resting symptoms.  He  has had chronic dyspnea, but this is unchanged.  He has no new PND or  orthopnea.  He has had no new swelling or abdominal discomfort or  distension.  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  He is seeing an ophthalmologist for this and has been  told he has a problem in the eye itself.   PAST MEDICAL HISTORY:  1. Coronary artery disease (see the March 09, 2007 note for details).  2. Cardiomyopathy (EF approximately 30%).  3. Chronic obstructive pulmonary disease, O2 dependent.  4. Hypertension.  5. Hyperlipidemia.  6. Morbid obesity.  7. Obstructive sleep apnea (unable or unwilling to use CPAP).  8. Anxiety.  9. Spinal fusion.  10.Hypothyroidism.  11.Diabetes mellitus.  12.Tonsillectomy.   ALLERGIES AND INTOLERANCES:  ALTACE caused cough, ACTOS caused edema.   MEDICATIONS:  1. Sorbitol.  2. NovoLog.  3. Aspirin 325 mg daily.  4. Potassium 80 mEq daily.  5. Furosemide 80 mg q.a.m. and 40 mg q.p.m.  6. Diovan 320 mg daily.  7. Plavix 75 mg daily.  8.  Levothroid 100 mcg daily.  9. Isosorbide 120 mg daily.  10.Metoprolol 100 mg b.i.d.  11.Spironolactone 50 mg daily.  12.Lipitor 80 mg nightly.  13.Digoxin 0.25 mg daily.   REVIEW OF SYSTEMS:  As stated in the HPI and otherwise negative for  other systems.   PHYSICAL EXAMINATION:  The patient is in no distress.  Blood pressure 158/82, heart rate 57 and regular, weight 279 pounds (up  3 pounds).  HEENT:  Eyelids unremarkable, pupils are equal, round, and reactive to  light, fundi not visualized.  Oral mucosa unremarkable.  NECK:  No jugular venous distension, wave form within normal limits at  45 degrees, carotid upstroke brisk and symmetrical.  No bruit.  No  thyromegaly.  LYMPHATICS:  No adenopathy.  LUNGS:  Clear to auscultation bilaterally.  HEART:  PMI not displaced or sustained.  S1 and S2 are within normal  limits.  No S3, no S4.  No clicks, no rubs, no murmurs.  ABDOMEN:  Obese,  positive bowel sounds, normal in frequency and pitch.  No bruits, no rebound, no guarding.  No midline pulsatile mass.  No  organomegaly.  SKIN:  No rashes, no nodules.  EXTREMITIES:  2+ upper pulses, 2+ dorsalis pedis bilaterally, mild  bilateral lower extremity edema.  NEURO:  Oriented to person, place, and time.  Cranial nerves grossly  intact, motor grossly intact.   EKG sinus rhythm, rate 77, axis within normal limits.  Intervals within  normal limits, no acute ST-T wave changes.  Poor anterior R-wave  progression.   ASSESSMENT AND PLAN:  1. Hypertension:  Blood pressure is elevated.  I am going to add      Norvasc 2.5 mg daily.  He will continue to keep his blood pressure      log.  2. Cardiomyopathy:  He seems to be euvolemic at this point.  He is      going to continue on the medications as listed.  As he is on      spironolactone, I am going to check a BMET today.  3. Diabetes:  He is not seeing his former primary care doctor anymore      as he thinks she left [place].  She was a  resident.  He knows      that he needs to see somebody for management of his diabetes, and      he is going to try to get an appointment back with Dr. Everardo All who      has seen him in the past.  4. Followup:  I will see the patient back in about 3 months or sooner      if needed.     [name], MD, [facility]  Electronically Signed    JH/MedQ  DD: 05/09/2007  DT: 05/10/2007  Job #: [id number]   cc:   [name] All, MD

## 2011-02-09 NOTE — Discharge Summary (Signed)
NAMEMIKEY, MAFFETT                 ACCOUNT NO.:  [id number]   MEDICAL RECORD NO.:  [id number]          PATIENT TYPE:  INP   LOCATION:  4731                         FACILITY:  [facility]   PHYSICIAN:  Madolyn Frieze. Jens Som, MD, FACCDATE OF BIRTH:  July 16, 1951   DATE OF ADMISSION:  12/25/2008  DATE OF DISCHARGE:  01/09/2009                               DISCHARGE SUMMARY   PRIMARY CARDIOLOGIST:  [name], MD, [facility]   DISCHARGE DIAGNOSES:  1. Acute-on-chronic systolic congestive heart failure.  2. Hyperkalemia.   SECONDARY DIAGNOSES:  1. Coronary artery disease, status post inferior wall myocardial      infarction in May 2007, and status post stent placement to right      coronary artery in May 2007, status post stenting of left anterior      descending, most recent cath in June 2008, demonstrated distal      obstructive left anterior descending disease, otherwise no acute      disease, stents patent.  2. Chronic obstructive pulmonary disease (O2 dependence).  3. Diabetes mellitus.  4. Morbid obesity.  5. Obstructive sleep apnea, on continuous positive airway pressure      (CPAP).  6. Hypothyroidism.  7. Hyperlipidemia.  8. Hypertension.  9. Anxiety.  10.History of spinal fusion.  11.History of Fournier gangrene, status post debridement in January      2006.  12.Left bundle-branch block.   ALLERGIES/INTOLERANCES:  1. ACE INHIBITORS (causes cough).  2. ACTOS (causes edema).   PROCEDURES:  1. The patient had EKG on December 25, 2008, showing sinus tachycardia at      a rate of 100 bpm and T-wave inversions in inferior leads and in      V6, left bundle-branch block, no significant Q waves, no      significant change from last tracing.  2. The patient had chest x-ray on December 25, 2008, showing cardiac      enlargement, bilateral pleural effusions, left greater than right,      findings suspicious for mild congestive heart failure.  Left lower      lobe airspace disease and  atelectasis.  3. The patient had EKG on December 28, 2008, showing sinus rhythm at a      rate of 71 bpm, otherwise no significant changes to prior EKG.  4. The patient had EKG on January 02, 2009, which was interpreted by the      machine as atrial synchronous ventricular pacing as QRS pathology      had changed from prior tracings, however, no significant ST-T wave      changes, and otherwise no significant changes from prior tracings.  5. The patient had second EKG on January 02, 2009, showing no significant      change from tracing performed earlier that day.  6. The patient had bilateral lower extremity venous Dopplers performed      on January 03, 2009, revealing findings consistent with short segment      of indeterminate-age DVT involving right proximal peroneal vein      (tibial vein).  No evidence of deep vein or  superficial thrombus      involving left lower extremity.   HISTORY OF PRESENT ILLNESS:  Mr. Asfaw is a 60 year old male with  multiple medical problems including obesity, diabetes, obstructive sleep  apnea, CAD, S/P previous inferior wall MI and systolic CHF secondary to  ICM with EF of 30-35% per echo in 2007.  He has been followed by Dr.  Antoine Poche in the Heart Failure Clinic.  Today, he reports 5-day history  of progressive shortness of breath, orthopnea, PND, and severe lower  extremity edema.  He initially took several extra doses of Lasix, which  helped for Diop while, but he continued to have worsening edema.  On  the evening of December 25, 2008, the patient's shortness of breath  worsened significantly and he called EMS.  He also notes that over the  past 2 days, he has had productive cough and occasional chills.  On  arrival to the ER, he was breathing 40 times a minute with O2 sats at  85%.  He was started on BiPAP, given 80 mg of Lasix IV, and given 2  treatments of albuterol.  Chest x-ray as described above.  BNP equal to  624.  He had a prompt response with about 1206  mL of urine output and  symptoms began to resolve.   HOSPITAL COURSE:  The patient admitted and underwent procedures as  described above.  He tolerated them well without significant  complications.  The patient was diuresed well on IV Lasix.  Symptoms  continued to improve.  He received tobacco cessation counseling as well  as inpatient education and information from diabetes counselor.  The  patient during discharge on January 01, 2009, when he was found to be  significantly hyperkalemic on January 02, 2009.  The patient was treated  for this and with resolution of elevated potassium by January 04, 2009,  however, discovered to have right lower extremity DVT as described above  on January 03, 2009.  Therefore, the patient was started on Coumadin with  Lovenox bridge.  He received Coumadin counseling/education from  pharmacy.  The patient was unable to afford co-pay for Lovenox bridge at  home, therefore kept until INR was within therapeutic range.  The  patient was euvolemic after January 04, 2009, and remained significantly  improved in terms of his heart failure symptoms throughout the rest of  his hospital course.  The patient's INR therapeutic on January 08, 2009,  and 24 hours within therapeutic range confirmed on January 09, 2009.  The  patient deemed stable enough for discharge at that time.  The patient  will be followed up in Coumadin Clinic on January 10, 2009, with Shelby Dubin.  The patient will also have basic metabolic panel drawn on January 13, 2009, and have a followup appointment with Dr. Antoine Poche shortly  there, they scheduled shortly thereafter.  The patient will receive his  medication list with new prescriptions and followup appointments at the  time of discharge, he should have no questions or concerns that are not  addressed.   DISCHARGE LABORATORIES:  WBC 6.6, HGB 12.3, HCT 36.2, and PLT count to  220.  Protime 27.8 and INR 2.4.  Sodium 141, potassium 4.5, chloride  111, CO2 of  26, BUN 29, creatinine 0.92, glucose 140, and calcium 9.1.   FOLLOWUP PLANS AND APPOINTMENTS:  Please see hospital course.   DISCHARGE MEDICATIONS:  1. Aspirin 81 mg p.o. daily.  2. Synthroid 100 mcg p.o. daily.  3.  Lipitor 40 mg p.o. daily.  4. Lantus 22 units p.o. daily.  5. Lasix 40 mg p.o. daily.  6. Coumadin 5 mg p.o. daily.  7. Imdur 60 mg daily.  8. Diovan 160 mg p.o. daily.  9. Coreg 12.5 mg p.o. b.i.d.  10.Nitroglycerin 0.4 mg sublingual p.r.n. for chest pain.  11.Digoxin 0.25 mg p.o. daily.  12.Norvasc 5 mg p.o. daily.   DURATION OF DISCHARGE/ENCOUNTER INCLUDING PHYSICIAN TIME:  1 hour.       Jarrett Ables, PAC      Madolyn Frieze. Jens Som, MD, [facility]  Electronically Signed    MS/MEDQ  D:  01/09/2009  T:  01/10/2009  Job:  [id number]   cc:   Sylvan Cheese, M.D.

## 2011-02-09 NOTE — H&P (Signed)
[name], [name] NO.:  [id number]   MEDICAL RECORD NO.:  [id number]          PATIENT TYPE:  INP   LOCATION:  4733                         FACILITY:  [facility]   PHYSICIAN:  Juan Faith, MD   DATE OF BIRTH:  1950/11/19   DATE OF ADMISSION:  05/12/2008  DATE OF DISCHARGE:                              HISTORY & PHYSICAL   PRIMARY CARE PHYSICIAN:  [name], DA   CHIEF COMPLAINT:  Shortness of breath.   HISTORY OF PRESENT ILLNESS:  This is a 60 year old white male with  multiple medical problems including ischemic cardiomyopathy and chronic  lung disease requiring 24-hour a day oxygen who went rafting today on  the river with his friends.  He says he forgot to take his portable  oxygen and within a few minutes of floating down the river he became  very short of breath and apparently was in significant respiratory  distress.  His friends called 911, oxygen was administered, and his  shortness of breath improved.  However, by that time the patient had  developed some substernal chest pressure and he was brought to the  emergency department.  Currently, his oxygen saturations have recovered  and he is pain free on 4 L of nasal cannula oxygen.  He does have  chronic stable angina and frequently uses nitroglycerin and his chest  pain today was similar to what he usually has.  The patient does not  know what medicines he takes.  He has been losing weight which he  attributes to diet and exercise, however, there may be a strong  component of hyperglycemia-induced weight loss as he has not been good  with his insulin.  The patient denies recent fevers or cough and admits  to chronic orthopnea and lower extremity edema.   PAST MEDICAL HISTORY:  1. Coronary artery disease, history of inferior myocardial infarction      in May 2007.  At that time, he had stent placement to the RCA and      LAD and had cutting balloon to the diagonal.  Last catheterization      was  in January 2008, which showed distal obstructive LAD disease,      otherwise unchanged disease.  2. Diabetes mellitus, poorly controlled.  3. Chronic lung disease, asthma versus bronchitis for which he is      oxygen dependent.  4. Obstructive sleep apnea, noncompliant with CPAP.  5. Ischemic cardiomyopathy with ejection fraction of approximately      30%.  6. Hypothyroidism.  7. Hyperlipidemia.  8. Hypertension.  9. Morbid obesity.  10.Anxiety.  11.Spinal fusion.  12.History of Fournier gangrene status post surgical debridement and      packing in January 2006.   SOCIAL HISTORY:  Lives in [place] alone.  He is divorced and has  one grown child.  He is a disabled truck driver and quit smoking greater  than 10 years ago.   FAMILY HISTORY:  Mother is alive and lives in a nursing home.  His  father's health is unknown.   ALLERGIES:  ACE  INHIBITORS cause cough, ACTOS cause edema.   MEDICATIONS:  He is unclear as to exactly what he is taking.  He appears  to be on,  1. Lipitor, unknown dose.  2. Diovan 320 mg p.o. daily.  3. Spirolactone 50 mg p.o. daily.  4. Lopressor 100 mg p.o. b.i.d.  5. Lasix 40 mg p.o. daily.  6. Plavix 75 mg p.o. daily.  7. Novolin 70/30 80 units q.a.m.  8. Levothroid 100 mcg p.o. daily.  9. Aspirin 325 mg p.o. daily.  10.Digoxin 250 mcg p.o. daily.  11.Amlodipine 2.5 mg p.o. daily.  12.Imdur 120 mg p.o. daily.  13.Sorbitol p.r.n.   REVIEW OF SYSTEMS:  Positive for weight loss, chest pain, shortness of  breath, dyspnea on exertion, orthopnea, edema, depression, weakness,  GERD, symptoms and nausea.  Otherwise, the balance of 14 systems was  reviewed and was negative.   PHYSICAL EXAMINATION:  VITAL SIGNS:  Temperature 97.8, pulse 88,  respiratory rate 21-30 breaths per minute, blood pressure 131/69, and  saturation 95% on 3 L.  GENERAL:  This is a pleasant obese man in no acute distress.  He is  speaking in full sentences without  difficulty.  HEENT:  Head is normocephalic and atraumatic.  Pupils equal, round, and  reactive to light.  Extraocular movements are intact.  Sclerae are  clear.  Dentition is fair.  NECK:  Supple.  External jugular veins are distended.  IJ is not  visible.  Right atrial pressure estimated at 10 cm of water.  CARDIOVASCULAR:  Normal rate and regular rhythm.  There is a S3, but no  S4.  No murmurs or rubs.  Pulses are 2+ bilaterally in radial and  dorsalis pedis.  LUNGS:  Reveal crackles in the right lung base.  The remaining lung  fields are clear.  ABDOMEN:  Obese, soft, and nontender with normal bowel sounds.  EXTREMITIES:  Reveal 1+ lower extremity edema bilaterally.  SKIN:  Reveal patchy red areas possibly representing cellulitis on both  lower extremities, right greater than left.  MUSCULOSKELETAL:  Reveals no acute joint deformities or effusions.  NEUROLOGIC:  Facial expressions are symmetric.  The patient is somewhat  sluggish but is awake, alert, and oriented x3.  Strength appears grossly  normal in all 4 extremities.   DIAGNOSTIC TESTS:  Chest x-ray shows cardiac enlargement with probable  right greater than left pulmonary edema.  EKG:  Sinus rhythm, rate 84  beats per minute with left bundle branch block, which has been seen on  prior EKGs intermittently.   LABORATORY DATA:  White blood cells 14.6, hemoglobin 14.2, and platelets  211.  Sodium 139, potassium 3.5, BUN 13, creatinine 0.9, and glucose  367.  AST 17, ALT 22, CK 170, CK-MB 6.7, and troponin 0.09.   IMPRESSION:  A 60 year old white man with shortness of breath and chest  pain today.   PLAN:  1. Admit to telemetry unit, rule out myocardial infarction by cycling      serial EKGs and cardiac enzymes.  2. Daily weights and strict in's and out's.  We will diurese with IV      Lasix as needed including 40 mg x1 now.  3. Check BNP.  4. Continue medicines from Dr. Jenene Graham last clinic note as      described  above.  5. Increase insulin, check hemoglobin A1c and cover with sliding      scale.  6. Leukocytosis, question possible source of infection including      possible  cellulitis of the right leg and possible pneumonia in the      right lung base.  Evidence not strong enough at this point to start      empiric antibiotics.  Follow both areas with serial physical exams      and consider antibiotics, especially if white count increases or      the patient spikes fever.  7. DVT prophylaxis with subcu Lovenox.      Juan Faith, MD  Electronically Signed     [name]  D:  05/13/2008  T:  05/13/2008  Job:  [id number]

## 2011-02-12 NOTE — Cardiovascular Report (Signed)
[name], EBEL NO.:  [id number]   MEDICAL RECORD NO.:  [id number]          PATIENT TYPE:  INP   LOCATION:  6527                         FACILITY:  [facility]   PHYSICIAN:  [name], M.D. [facility] DATE OF BIRTH:  1950-10-11   DATE OF PROCEDURE:  02/01/2006  DATE OF DISCHARGE:                              CARDIAC CATHETERIZATION   CLINICAL HISTORY:  [name] is 60 years old and is a diabetic.  He weighs  286 pounds.  He was admitted with unstable angina and studied by Dr.  Antoine Poche.  Plans were made to intervene on his LAD and diagonal branch but  this weekend he developed chest pain and EKG change consistent with an  inferior infarction.  He was brought to the laboratory and a small distal  right was opened and a HORIZON study stent was placed as part of the HORIZON  protocol.  He was brought back today for intervention on his LAD and  diagonal branch.   PROCEDURE:  The procedure was performed by the right femoral artery using  arterial sheath and 6-French preformed coronary catheters.  A frontal wall  arterial puncture was performed and Omnipaque contrast was used.  Patient  was given Angiomax bolus and infusion.  He had already been on Plavix.  We  used a Q4 6-French guiding catheter with side holes.  We crossed the lesion  in the mid LAD with a Prowater wire without difficulty.  Lesion was located  at a diagonal branch but the diagonal ostium appeared to be free of  significant disease and in order to simplify the procedure we made the  decision just to stent across the diagonal branch and treat the diagonal  branch only for reduced flow.  We direct stented the lesion with a 2.5 x 16  mm TAXUS stent deploying this with one inflation up to 12 atmospheres for 30  seconds.  We post dilated with a 2.75 x 12 mm Quantum Maverick balloon  performing two inflations up to 15 atmospheres for 30 seconds.   We then approached the first diagonal branch.  We re-routed  the Prowater  wire down the first diagonal branch.  We passed a 2.25 x 15 mm cutting  balloon to the lesion.  The lesion was a Kuzniar bit longer than 15 mm.  We  performed three inflations up to 8 atmospheres for 20 seconds in the mid and  distal portion of the lesion.  We then moved the cutting balloon back to  cover the ostium of the diagonal branch and performed an additional three  inflations up to 10 atmospheres for 30 seconds.  Final diagnostic study was  then performed through the guiding catheter.  Patient told procedure well  and left the laboratory in satisfactory condition.   RESULTS:  Initially the stenosis in the LAD was estimated at 90%.  Following  stenting this improved to 0%.  There was some pinching of the diagonal  branch to about 50-60% but this did not impair flow.   The lesion in the first diagonal branch was initially 95% and  then was  segmental over about 18 mm.  Following stenting this improved to less than  20%.   CONCLUSION:  1.  Successful PCI of the lesion in the mid left anterior descending artery      using a TAXUS drug-eluting stent with improvement in center of narrowing      from 90% to 0%.  2.  Successful PCI of the ostial lesion of the first diagonal branch off the      left anterior descending using a cutting balloon with improvement in      center of narrowing from 95% to less than 20%.   DISPOSITION:  Patient returned to postanesthesia care unit for further  observation.  He should remain on Plavix for at least a year and possibly  long-term.           ______________________________  [name], M.D. [facility]     BB/MEDQ  D:  02/01/2006  T:  02/02/2006  Job:  [id number]   cc:   [name] All, M.D. [facility]  [address]  [place]  [place] [address]   [name], M.D.  [address]  [address]  [place]  [place] [address]   [facility]

## 2011-02-12 NOTE — Op Note (Signed)
NAMEEMIR, NACK NO.:  [id number]   MEDICAL RECORD NO.:  [id number]          PATIENT TYPE:  INP   LOCATION:  6709                         FACILITY:  [facility]   PHYSICIAN:  Velora Heckler, MD      DATE OF BIRTH:  1950-10-19   DATE OF PROCEDURE:  10/02/2004  DATE OF DISCHARGE:                                 OPERATIVE REPORT   PREOPERATIVE DIAGNOSIS:  Fournier's gangrene of perineum with abscess.   POSTOPERATIVE DIAGNOSIS:  Fournier's gangrene of perineum with abscess.   OPERATION/PROCEDURE:  Full-thickness debridement of posterior scrotum and  perineal body with drainage of abscess and open packing.   SURGEON:  Velora Heckler, M.D.   ANESTHESIA:  General.   ESTIMATED BLOOD LOSS:  Minimal.   PREPARATION:  Betadine.   COMPLICATIONS:  None.   INDICATIONS:  The patient is a 60 year old white male presented today to  [facility] with perineal abscess and skin necrosis consistent  with Fournier's gangrene.  The patient was seen by Dr. Ollen Gross. Carolynne Edouard and  referred to [facility].  The patient was admitted on the surgical  service.  He is now brought to the operating room for debridement and  drainage.   DESCRIPTION OF PROCEDURE:  The procedure was done in OR #1 at the [facility]  [facility].  The patient was brought to the operating room and  placed in a supine position on the operating room table.  Following  administration of general anesthesia, the patient was prepped and draped in  the usual strict aseptic fashion.  After ascertaining that an adequate level  of anesthesia had been obtained, the skin of the posterior scrotum and  perineum is excised using the electrocautery for hemostasis.  Full-thickness  debridement is required due to full-thickness necrosis of the skin.  The  area debrided measures approximately 4 x 3 cm.  Upon entering the abscess  cavity, there is extensive necrosis of underlying tissues.  This extends  into the left hemiscrotum which is debrided.  It also extends posteriorly to  the perianal region and down the right side of the right medial buttock.  Purulent fluid is evacuated.  Aerobic and anaerobic cultures are submitted  to the laboratory.  A second skin incision is made to the  right of the anus.  Wound is copiously irrigated with warm saline.  Necrotic  tissue is extracted.  Wound is then packed with one-inch Iodoform gauze  packing.  Dry gauze dressings are placed.  The patient is awakened and  brought to the recovery room in stable condition.  The patient tolerated the  procedure well.      Todd   TMG/MEDQ  D:  10/02/2004  T:  10/03/2004  Job:  [id number]   cc:   [name] All, M.D. [facility]

## 2011-02-12 NOTE — Discharge Summary (Signed)
[name], [name]                 ACCOUNT NO.:  [id number]   MEDICAL RECORD NO.:  [id number]          PATIENT TYPE:  INP   LOCATION:  1613                         FACILITY:  [facility]   PHYSICIAN:  Charlaine Dalton. Sherene Sires, M.D. [facility] OF BIRTH:  June 20, 1951   DATE OF ADMISSION:  07/06/2005  DATE OF DISCHARGE:                                 DISCHARGE SUMMARY   FINAL DIAGNOSES:  1.  Congestive heart failure with severe volume overload.      1.  Echocardiogram July 07, 2005, a technically-difficult study with          ejection fraction 45%.      2.  Bilateral pleural effusions right greater than left, consistent with          biventricular congestive heart failure.  2.  Chronic obstructive pulmonary disease status post recent smoking      cessation.  3.  Type 2 diabetes secondary to morbid obesity.  4.  Sleep apnea, oxygen dependent.  5.  Severe financial constraints limiting outpatient compliance.  6.  Hypertension.  7.  Hypothyroidism.   HISTORY:  Please see dictated H&P. This patient was admitted with 55-month  history of progressive dyspnea with peripheral edema, failing to respond to  Lasix in the office but admitting that he was not able to afford any of the  medicines he was supposed to be maintained on as an outpatient. The initial  feeling was he probably had cor pulmonale from nocturnal hypoxemia but had  evidence of bilateral pleural effusions and pulmonary edema as well with a  BNP of 303. Ejection fraction was noted to be slightly decreased and  transiently he required BiPAP in the ICU.   On the floor, he has progressed to the point where he has lost down to 260  pounds at the time of discharge with minimal edema. He did have slight  asymmetry to the edema of his lower extremities and a venous Doppler was  done the day prior to discharge and was felt to be normal.   Therefore, the patient is discharged in improved condition on oxygen at 2 L  per minute at a weight of 260  pounds with normal CBC and BMET documented 2  days prior to discharge. His discharge medications will need to be worked  out because he cannot afford any of the medicines that he is on, but for now  he is on:  1.  Aldactone 50 mg one t.i.d.  2.  Aspirin 325 mg one daily.  3.  Diovan 320 mg one daily.  4.  Lantus 60 units daily.  5.  Lasix 40 mg one daily.  6.  Crestor 10 mg one daily.  7.  Sorbitol 70% two tablespoons b.i.d.  8.  Synthroid 100 mcg one daily.  9.  Oxygen 2 L a minute 24 hours a day.  10. Use nebulizer with albuterol 2.5 mg q.4h. as needed.   Follow-up will be in 1 week either in the pulmonary office of [facility] or at [facility].  ______________________________  Charlaine Dalton Sherene Sires, M.D. [facility]     MBW/MEDQ  D:  07/20/2005  T:  07/20/2005  Job:  [id number]   cc:   Dala Dock

## 2011-02-12 NOTE — Assessment & Plan Note (Signed)
[facility]                              CARDIOLOGY OFFICE NOTE   CHASE, ARNALL                        MRN:          [id number]  DATE:05/02/2006                            DOB:          July 17, 1951    The primary is Ursula Beath, MD, [facility].   REASON FOR VISIT:  Evaluate patient for coronary disease and non-Q-wave  myocardial infarction.   HISTORY OF PRESENT ILLNESS:  The patient returns for follow-up.  He is  continuing to have chest discomfort regarding frequent sublingual  nitroglycerins.  He has extensive small-vessel disease as described below.  He simply has his baseline dyspnea but no new symptoms.  He has no PND or  orthopnea.  He has no palpitations, no presyncope or syncope.   PAST MEDICAL HISTORY:  1.  Coronary artery disease (__________  90%, ostial and proximal stenosis      in the first diagonal branch, 90% stenosis in the LAD after the second      diagonal, the circumflex had 2 marginal branches and 2 posterolateral      branches, posterolateral 90% stenosis followed by 95, followed by 90%      stenosis in a second posterolateral, the right coronary artery with 95%      stenosis in the distal segment with TIMI-2 flow.  The EF was      approximately 35%.  He subsequently underwent Taxus stenting in the      distal right coronary artery.  He had no resolution of his chest pain      with this).  2.  Ischemic cardiomyopathy.  3.  Chronic obstructive pulmonary disease on chronic home O2.  4.  Hypertension.  5.  Type 2 diabetes mellitus.  6.  Obesity.  7.  Sleep apnea.  8.  Hypothyroidism.  9.  Hyperlipidemia.  10. Anxiety.  11. Constipation.  12. Tonsillectomy.  13. Spinal fusion.   ALLERGIES:  ACTOS and ALTACE.   MEDICATIONS:  1.  Aspirin 325 mg daily.  2.  Klor-Con 80 mEq daily.  3.  Furosemide 40 mg t.i.d.  4.  Levothroid 0.1 mg daily.  5.  Diovan 320 mg daily.  6.  Sorbitol.  7.   Humalog.  8.  Plavix 75 mg daily.  9.  Lipitor 80 mg daily.  10. Imdur 60 mg daily.  11. Metoprolol 100 mg q.a.m. and 50 mg q.p.m.  12. Spironolactone 25 mg daily.  13. O2.   REVIEW OF SYSTEMS:  As stated in the HPI and otherwise negative for other  systems.   PHYSICAL EXAMINATION:  GENERAL:  The patient is in no distress.  VITAL SIGNS:  Blood pressure 132/86, heart rate 70 and regular, weight 278.  NECK:  No jugular venous distention at 45 degrees, carotid upstroke brisk  and symmetric, no bruits, no thyromegaly.  LYMPHATIC:  No adenopathy.  LUNGS:  Clear to auscultation bilaterally.  BACK:  No costovertebral angle tenderness.  CHEST:  Unremarkable.  CARDIAC:  PMI not displaced or sustained, S1 and S2 within normal limits, no  S3, no  S4, no murmurs.  ABDOMEN:  Obese, positive bowel sounds, normal in frequency and pitch, no  bruits, no rebound, no guarding, no midline pulsatile mass, no organomegaly.  SKIN:  No rash.  EXTREMITIES:  2+ pulses, mild bilateral lower extremity edema.  NEUROLOGIC:  Grossly intact.   EKG:  Sinus rhythm, rate 78, axis within normal limits, intervals within  normal limits, no acute ST-T wave change.   ASSESSMENT AND PLAN:  1.  Coronary disease.  The patient has diffuse small-vessel disease.  He had      no improvement in symptoms after angioplasty of his right coronary.  At      this point I think his pain may be related to one of the small branch      vessels that is not amenable to revascularization.  We are going to try      and continue aggressive medical management.  I am going to increase his      Imdur to 120 mg daily.  The next step will probably be to add Ranexa.  I      offered him a 2-week follow-up, but he cannot make it for another month      because of financial reasons.  He knows to call 911 should he have any      severe, unrelieved pain.  2.  Cardiomyopathy.  He has class II symptoms.  It is difficult to sort out      his chronic  obstructive      pulmonary disease.  He will get a BMET today.  3.  Follow-up as above.  I will see him in 1 month.                               [name], MD, [facility]   JH/MedQ  DD:  05/02/2006  DT:  05/02/2006  Job #:  [id number]   cc:   Ursula Beath, MD

## 2011-02-12 NOTE — Cardiovascular Report (Signed)
[name], [name] NO.:  [id number]   MEDICAL RECORD NO.:  [id number]          PATIENT TYPE:  INP   LOCATION:  2901                         FACILITY:  [facility]   PHYSICIAN:  [name], M.D.   DATE OF BIRTH:  03/06/1951   DATE OF PROCEDURE:  01/27/2006  DATE OF DISCHARGE:                              CARDIAC CATHETERIZATION   PRIMARY CARE PHYSICIAN:  Sean A. Everardo All, M.D. [facility]   INDICATIONS FOR PROCEDURE:  Evaluate patient with congestive heart failure  and a reduced ejection fraction.  He also ruled in for a non-Q wave  myocardial infarction.   PROCEDURE:  Left heart catheterization was performed via the right femoral  artery, right heart catheterization was performed via the right femoral  vein.  Both vessels were cannulated using anterior wall puncture.  A #6  French arterial sheath and a #7 [place] venous sheath were inserted via the  modified Seldinger technique.  Preformed Judkins, pigtail, and Swan-Ganz  catheter were utilized.  The patient tolerated the procedure well and left  the lab in stable condition.   RESULTS:  Hemodynamics:  RA mean 19, RV 59/19 with a mean of 22, PA 56/30  with a mean of 41, pulmonary capillary wedge pressure mean 28, LV 117/30, AO  113/80, cardiac output/cardiac index (Fick) 4.8/2.2.   Coronaries:  The left main was normal.  The LAD had a focal 80% lesion  immediately after the takeoff of the mid-diagonal.  There was severe diffuse  distal disease in the vessel.  There was diagonal branch which ran in the  ramus intermediate distribution.  It had long diffuse 80% stenosis and was a  small to moderate-size vessel.  The circumflex in the AV groove had a normal  proximal portion.  The first diagonal was narrow in caliber with long 60-70%  stenosis.  The second obtuse marginal was large and normal.  There were  three posterolaterals which appeared to be very narrow-caliber vessels but  long.  They all had subtotal stenosis  with TIMI 2 to 3 flow.  The right  coronary artery was a dominant though not long vessel.  It was free of high-  grade disease.   Left ventriculogram.  The left ventriculogram was obtained in the RAO  projection.  The EF was 40% with global hypokinesis.   CONCLUSION:  1.  Moderate left ventricular dysfunction.  2.  Coronary disease with diffuse branch vessel and distal vessel disease.      There is a discrete mid-LAD lesion.  3.  Elevated pulmonary pressures related to left ventricular end-diastolic      pressure elevation.   PLAN:  The patient has a reduced ejection fraction.  He had a non-Q wave MI.  The culprit may likely be the circumflex vessels, though this is not clear.  These vessels and the ramus intermediate disease are not amenable to  percutaneous revascularization.  They are small vessels and not graftable.  I doubt that the mid-LAD lesion is contributing significantly to the overall  picture.  This is true in particular since there is diffuse LAD  disease  distal to this.   At this point, I would suggest maximal medical therapy if we can have him be  compliant.  If he failed maximal medical therapy with recurrent shortness of  breath or chest discomfort, we could think about angioplasty of the LAD  lesion.  He needs very aggressive secondary risk reduction.           ______________________________  [name], M.D.     JH/MEDQ  D:  01/27/2006  T:  01/28/2006  Job:  [id number]   cc:   [name] All, M.D. [facility]  [address]  [place]  [place] [address]

## 2011-02-12 NOTE — Assessment & Plan Note (Signed)
HEALTHCARE                              CARDIOLOGY OFFICE NOTE   SYD, NEWSOME                        MRN:          [id number]  DATE:06/02/2006                            DOB:          September 28, 1950    PRIMARY CARE PHYSICIAN:  Ursula Beath, MD, [facility].   REASON FOR PRESENTATION:  Patient with dyspnea, volume overload.   HISTORY OF PRESENT ILLNESS:  The patient presents for followup.  At the last  visit, he was having chest discomfort.  I increased his Imdur to 120 mg a  day.  He says now he is rarely taking the nitroglycerine, perhaps 2-3 times  this month.  His dyspnea is at baseline.  He did have one episode last week  where he had to sleep propped up because he was short of breath.  He drinks  a lot of water, but avoids salt.  He does not keep his feet elevated.  Overall, his symptoms are stable with class 3 heart failure, difficult to  sort out from his COPD.   PAST MEDICAL HISTORY:  1. Coronary artery disease (see the May 02, 2006 note for details).  2. Ischemic cardiomyopathy (EF 35%).  3. Chronic obstructive pulmonary disease.  4. Hypertension.  5. Type 2 diabetes mellitus.  6. Obesity.  7. Sleep apnea.  8. Hypothyroidism.  9. Hyperlipidemia.  10.Anxiety.  11.Constipation.  12.Tonsillectomy.  13.Spinal fusion.   ALLERGIES:  ACTOS AND ALTACE.   MEDICATIONS:  1. Aspirin 325 mg daily.  2. Chlor-Con 8 mEq daily.  3. Levothroid 0.1 mg daily.  4. Diovan 320 mg daily.  5. Sorbitol.  6. Humalog.  7. Plavix 75 daily.  8. Lipitor 80 mg daily.  9. Metoprolol 100 mg q.a.m., 50 mg q.p.m..  10.Spironolactone 25 mg daily.  11.Oxygen continuous.  12.Isosorbide 120 mg daily.   REVIEW OF SYSTEMS:  As stated in the HPI; otherwise, negative for all other  systems.   PHYSICAL EXAMINATION:  GENERAL:  Patient is in no acute distress.  He is  chronically ill-appearing.  VITAL SIGNS:  Blood pressure 138/85.   Pulse 72 and regular.  Weight 207  pounds (up 8 pounds from a month ago).  NECK:  No jugular venous distention at 45 degrees, carotid upstroke brisk  and symmetric.  LUNGS:  Clear to auscultation bilaterally.  BACK:  No costovertebral angle tenderness.  CHEST:  Unremarkable.  HEART:  PMI not displaced or sustained.  S1 and S2 within normal limits.  No  S3, no S4, no murmurs.  ABDOMEN:  Obese, positive bowel sounds, normal in frequency and pitch, no  bruits, no rebound, no guarding, no midline pulsatile masses, no  organomegaly.  SKIN:  No rashes, no bruises.  EXTREMITIES:  2+ pulses, moderate edema.  NEURO:  Grossly intact.   EKG:  Sinus rhythm, rate 73, axis within normal limits, intervals within  normal limits, no acute ST-T wave changes.   ASSESSMENT/PLAN:  1. Congestive heart failure.  The patient did not understand volume      restriction and I have counseled him  about this.  He did not understand      keeping his feet elevated and I have counseled him about this.  (All of      this is repeat education).  Today, I am going to have him take his      Lasix 80 mg in the morning for 3 days, as well as his other two doses      of 40 mg.  He is going to restrict his fluid.  He did have a BMET done      last month and it was within normal limits.  He will come back and have      BMET done in about 4 weeks.  2. Coronary disease.  He will continue with risk reduction.  3. Followup.  I will see him back in about 6 weeks in [facility]. I would      like to see him more frequently, but he will not come back because of      cost considerations.                               [name], MD, [facility]    JH/MedQ  DD:  06/02/2006  DT:  06/02/2006  Job #:  [id number]   cc:   Ursula Beath, MD

## 2011-02-12 NOTE — Op Note (Signed)
NAMEBREEZE, ANGELL NO.:  [id number]   MEDICAL RECORD NO.:  [id number]          PATIENT TYPE:  INP   LOCATION:  6709                         FACILITY:  [facility]   PHYSICIAN:  Velora Heckler, MD      DATE OF BIRTH:  1951/08/29   DATE OF PROCEDURE:  10/04/2004  DATE OF DISCHARGE:                                 OPERATIVE REPORT   PREOPERATIVE DIAGNOSIS:  Fournier's gangrene, perineal abscess.   POSTOPERATIVE DIAGNOSIS:  Fournier's gangrene, perineal abscess.   OPERATION PERFORMED:  1.  Dressing change under anesthesia.  2.  Wound debridement.   SURGEON:  Velora Heckler, M.D.   ANESTHESIA:  General.   ESTIMATED BLOOD LOSS:  Minimal.   PREPARATION:  Betadine.   COMPLICATIONS:  None.   INDICATIONS FOR PROCEDURE:  The patient is a 60 year old white male admitted  on the surgical service with Fournier's gangrene and perineal abscess.  On  the evening of January 6, he underwent debridement and drainage of perineal  abscess with open packing.  The patient was returned to the operating room  at this time for dressing change and further debridement.   DESCRIPTION OF PROCEDURE:  The procedure was done in operating room #15 at  the [facility]. [facility].  The patient was brought to the  operating room and placed in supine position on the operating room table.  Following administration of general anesthesia, the patient was placed in  lithotomy.  Previous dressings were removed.  Packing was removed from the  perineal abscess cavity.  The wound was copiously irrigated with warm  saline.  There does not appear to be any further progression of the  infectious process.  There is a small amount of necrotic tissue to the right  of midline in the base of the wound.  This was sharply debrided.  There was  likewise  approximately a 1 cc area of necrotic tissue to the left of midline in the  deep tissues which was also sharply debrided.  The wound was then  irrigated  again.  Packing was placed with two inch vaginal packings moistened with  normal saline.  Dry gauze dressings and ABD pads were placed.  The patient  tolerated the procedure well.      Todd   TMG/MEDQ  D:  10/04/2004  T:  10/04/2004  Job:  [id number]   cc:   [name] All, M.D. [facility]

## 2011-02-12 NOTE — Consult Note (Signed)
NAME:  Juan, Graham                 ACCOUNT NO.:  [id number]   MEDICAL RECORD NO.:  [id number]          PATIENT TYPE:  INP   LOCATION:  6709                         FACILITY:  [facility]   PHYSICIAN:  Bruce Rexene Edison. Swords, M.D. [facility] OF BIRTH:  03/02/1951   DATE OF CONSULTATION:  10/02/2004  DATE OF DISCHARGE:                                   CONSULTATION   I am asked to see [name] to evaluate the patient in the perioperative  period in regards to diabetes, hypertension, COPD, and hyperlipidemia.  Mr.  Graham is being admitted for scrotal and perineal abscess to be operated on  tonight.  He has a greater than one week history of scrotal pain and sought  medical attention today.  The patient is unclear as to the diagnosis that he  carries.  His past medical history is significant for type 2 diabetes for  many years, hypertension, hyperlipidemia, COPD, diabetic retinopathy, sleep  apnea.   CURRENT MEDICATIONS:  Cardizem CD 180 mg p.o. daily, Diovan 1/60/HCT 12.5 1  p.o. daily, Crestor 20 mg p.o. daily, Actos 45 mg p.o. daily, Metformin 1000  mg b.i.d., Advair 250/50 1 puff t.i.d.   SOCIAL HISTORY:  He is single, he says he has quit smoking tobacco.   FAMILY HISTORY:  Negative for COPD.   REVIEW OF SYMPTOMS:  He denies any chest pain, shortness of breath,  paroxysmal nocturnal dyspnea, orthopnea, abdominal pain, lower extremity  edema, rashes.  He admits to significant discomfort in the scrotal area.  He  denies any other complaints on the review of systems.   PHYSICAL EXAMINATION:  VITAL SIGNS:  Temperature 97.1, pulse 123, respirations 22, blood pressure  153/99, saturation 96% on 2 liters, weight 295.9 pounds.  GENERAL:  He is a morbidly obese male in no acute distress.  HEENT:  Normocephalic, atraumatic, extraocular movements intact.  NECK:  Supple without lymphadenopathy, thyromegaly, jugular venous  distention, or carotid bruits.  CHEST:  Clear to auscultation without any  increased work of breathing.  CARDIAC:  S1 and S2 are normal without murmurs or gallops.  ABDOMEN:  Obese, active bowel sounds, soft, nontender, there is no  hepatosplenomegaly.  EXTREMITIES:  Trace edema at the ankles.  GU:  He has a large scrotal abscess with induration and erythema extending  to the perineal area and also to the periscrotal area.  Marked tenderness to  this area.   LABORATORY DATA:  Labs pending.  EKG pending.  Chest x-ray pending.   ASSESSMENT AND PLAN:  1.  Perineal abscess with potential Fournier's gangrene, needs urgent      surgical debridement.  2.  Type 2 diabetes, will start Lantus 30 units and a sliding scale of      regular insulin a.c. and h.s.  Will continue his medications except will      hold the Metformin given his recent hospitalization.  3.  Hypertension, will continue current medications.  4.  COPD, he seems to be doing quite well, will start Albuterol nebulizers      empirically, maintain oxygen, and start  Advair.  5.  Hyperlipidemia, will continue Crestor.   We will follow the patient in the perioperative period.      BHS/MEDQ  D:  10/02/2004  T:  10/02/2004  Job:  [id number]   cc:   [name] All, M.D. [facility]

## 2011-02-12 NOTE — H&P (Signed)
NAMEAARNAV, STEAGALL                 ACCOUNT NO.:  [id number]   MEDICAL RECORD NO.:  [id number]          PATIENT TYPE:  INP   LOCATION:                               FACILITY:  [facility]   PHYSICIAN:  Ollen Gross. Vernell Morgans, M.D. DATE OF BIRTH:  October 25, 1950   DATE OF ADMISSION:  10/02/2004  DATE OF DISCHARGE:                                HISTORY & PHYSICAL   Mr. Stalling is a 60 year old white male who presents today with  pain and  drainage from his perineal area.  He has been running fevers and having  chills at home.  This has been going on for the last couple of weeks and  this has gotten steadily worse.  He denies any nausea, vomiting, chest pain,  shortness of breath, diarrhea, dysuria.  The rest of his review of systems  unremarkable.   His past medical history is significant for  diabetes, hypertension,  gastroesophageal reflux, and chronic obstructive pulmonary disease.  He is  home O2 dependent.   Past surgical history is significant for back surgery.   MEDICATIONS:  1.  Metformin.  2.  Klor-Con.  3.  Diltiazem.  4.  Lasix 40 mg daily.  5.  Protonix.  6.  Crestor.  7.  Levothroid 50 mcg daily.  8.  Diovan.   ALLERGIES:  No known drug allergies.   SOCIAL HISTORY:  He smokes about a pack of cigarettes a day and denies any  alcohol use.   FAMILY HISTORY:  Noncontributory.   PHYSICAL EXAMINATION:  VITAL SIGNS:  Temperature 98.3, weight 296 pounds,  blood pressure 144/92, pulse 119.  GENERAL:  He is a well-developed, well-nourished white male in some  distress.  SKIN:  Warm and dry with no jaundice.  HEENT:  Eyes:  Extraocular muscles are intact.  Pupils equal, round and  reactive to light.  Sclerae nonicteric.  LUNGS:  Decreased breath sounds bilaterally.  HEART:  Regular rate and rhythm with with an impulse in left chest.  ABDOMEN:  Soft, nontender with no palpable mass or hepatosplenomegaly.  EXTREMITIES:  No clubbing, cyanosis or edema with good strength in his arms  and legs.  PSYCHOLOGIC:  He is alert and oriented x3 with no anxiety or depression.  RECTAL:  He has a large perineal abscess at the base of his scrotum with  about a quarter size area of skin necrosis. This area is draining purulent  material.   ASSESSMENT/PLAN:  This is a 60 year old white male diabetic with chronic  obstructive pulmonary disease, home O2 dependent who has a large perineal  abscess.  This will need to be drained in the operating room. We will admit  him for IV antibiotics in preparation for doing this tonight for him.  I  have also consulted the [facility] to help [place] manage his medical  problems.      PST/MEDQ  D:  10/02/2004  T:  10/02/2004  Job:  [id number]

## 2011-02-12 NOTE — Assessment & Plan Note (Signed)
[facility]                             PULMONARY OFFICE NOTE   NAME:LITTLEMykle, Pascua                        MRN:          [id number]  DATE:09/07/2006                            DOB:          1951-07-16    HISTORY:  The patient is a 60 year old white male with documented CHF,  previously seen by Dr. Antoine Poche in cardiology, September 2006, with a  very detailed and thorough note, recommending the patient be followed  regularly for CHF.  The patient has not been back since September for  this purpose and says he cannot afford the cab ride.  However, he is  paying for his medicines out of his own pocket, which include Diovan at  320 mg daily, along with multiple generics.  For full inventory of  dosing, please see face sheet dated September 07, 2006, but note, the  patient uses a bag of pills approach.   The patient comes in today, complaining of increasing swelling of both  legs over the last several weeks.  He denies any orthopnea or increased  dyspnea over baseline.   PHYSICAL EXAMINATION:  He is a pleasant, ambulatory, obese, white male,  in no acute distress, with a blood pressure of 160/100 with weight of  298 pounds, which is down almost ten pounds over his previous visit.  HEENT:  Unremarkable.  Oropharynx clear.  LUNGS:  Lung fields are completely clear bilaterally to auscultation and  percussion.  HEART:  There is regular rate and rhythm without murmurs, gallops or  rub.  ABDOMEN:  Abdomen is soft, benign.  EXTREMITIES:  Warm without calf tenderness, cyanosis or clubbing.  There  was 4+ pitting edema from the knees down, bilaterally.   IMPRESSION:  Congestive heart failure with refractory edematous state,  secondary to, most likely, nonadherence.  Dr. Antoine Poche had listed  spironolactone as a maintenance drug on his previous visit.  The patient  did not recognize this from the list and says he may have a bottle at  home, but the prescription  has run out.  I have recommended restarting  generic aldactone at 50 mg daily today and I gave him free samples of  Diovan, the most expensive medicine he has, to help offset his travel  expenses.   I recommended followup here in a week for a recheck on the edema issue  and also, at that time, provide a full medication reconciliation and  generate a medication counter for the patient for referral back to the  heart failure clinic.   I do not see an active pulmonary problem here, other than the fact that  the patient is chronically oxygen-dependent, related to obesity and CHF,  and recommended that he stay on the oxygen  24 hours a day and follow up in the heart failure clinic.  We will see  him here on a p.r.n. basis only.     Charlaine Dalton. Sherene Sires, MD, [facility]  Electronically Signed    MBW/MedQ  DD: 09/07/2006  DT: 09/07/2006  Job #: [id number]   cc:   [name], MD, [facility]

## 2011-02-12 NOTE — Cardiovascular Report (Signed)
[name], [name] NO.:  [id number]   MEDICAL RECORD NO.:  [id number]          PATIENT TYPE:  INP   LOCATION:  2910                         FACILITY:  [facility]   PHYSICIAN:  [name], M.D. [facility] DATE OF BIRTH:  April 07, 1951   DATE OF PROCEDURE:  01/29/2006  DATE OF DISCHARGE:                              CARDIAC CATHETERIZATION   HISTORY:  [name] is a 60 year old disabled gentleman who is diabetic.  He was admitted with unstable angina and studied by Dr. Antoine Graham and found  to have a tight lesion in his LAD and first diagonal branch, severe diffuse  disease in the circumflex artery and moderately severe disease in the distal  right coronary artery.  We had planned  elective PCI in the LAD and diagonal  branch next week.  Tonight he developed chest pain at about 8:00 p.m. and  inferior ST elevation.  He was seen by Dr. Christin Graham and we brought him to the  cath lab for possible intervention.   PROCEDURE:  The procedure was performed via the right femoral artery and  arterial sheath and 6 French preformed coronary catheters.  We also gained  access to the right femoral vein with a venous sheath.  After completion of  the diagnostic study, we made the decision to proceed with intervention of  the distal right coronary artery.   The patient was enrolled in the Horizons trial and was randomized to  Integralin and unfractionated heparin. Heparin was given to prolong  __________  to greater than 200 seconds and double bolus Integralin and  infusion were given.  We used a JR-4 6-French guiding catheter with side  holes.  We passed a Prowater wire across the lesion of the distal right  coronary without difficulty.  We predilated with a 2.25 x 50 mm Maverick  performing two inflations up to 8 atmospheres for 30 seconds.  We then  deployed a 2.25 x 60 mm Horizon study stent deploying this with one  inflation 9 atmospheres for 30 seconds.  We post dilated with a 2.5 x  12-mm  Quantum Maverick performing two inflation up 15 inches for 30 seconds.   FINAL DIAGNOSIS:  __________ with the guiding catheter.  The patient  tolerated the procedure well and left the lab in satisfactory condition.   RESULTS:  The aortic pressure was 124/80 and left ventricular pressure was  124/30.   LEFT MAIN CORONARY:  The left main coronary artery was free of disease.  LEFT ANTERIOR DESCENDING ARTERY:  The left anterior descending artery and  posterior descending artery gave rise to two moderately large diagonal  branches and a large septal perforator.  There was 90% ostial and proximal  stenosis in the first diagonal branch.  There was 90% stenosis in the LAD  just after the second diagonal branch.   CIRCUMFLEX ARTERY:  The circumflex artery was a moderate size vessel that  gave rise to two marginal branches and two posterolateral  branches.  Posterolateral branches were severely diseased with 95% stenosis in the  first posterolateral branch and 95 and 90% stenosis  in the proximal and mid  portion of the second posterolateral branch.   RIGHT CORONARY ARTERY.  The right coronary artery was a small to moderate  sized vessel that gave rise to a marginal branch, a short posterior  descending and a small posterolateral branch.  There was 95% stenosis in the  distal right coronary artery with TIMI II flow.   LEFT VENTRICLE.  Left ventriculogram performed in the RAO projection showed  akinesis of the inferobasal segment.  There was global hypokinesis of the  rest of the segments.  The estimated ejection fraction was 35%.   Following stenting of the lesion in the distal right coronary, stenosis  improved from 95% to 0% and the flow improved from TIMI II to TIMI III flow.  Myocardial blush with estimated at grade III before and after intervention.   The patient had the onset of chest pain at 2000 and the ECG was obtained  2123.  The first balloon inflation was performed a  2323.  This gave an ECG  with a balloon time of 2 hours and a refusion time of 3 hours and 23  minutes.   IMPRESSION:  1.  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  2.  Successful PCI of the lesion in the distal right coronary using a      Horizon study stent with improvement in central narrowing from 95% to 0%      and improvement in the flow from TIMI II to TIMI III flow.   DISPOSITION:  The patient will return to the recovery room for further  observation.  I am not certain that the right coronary artery was the  infarct vessel since the circumflex was also severely diseased.  The lesion  in the distal right appeared to be somewhat worse than before and the flow  was slightly reduced.  The circumflex vessel was very unfavorable for  intervention as well.  Will plan elective intervention on the left anterior  descending and diagonal branch next week.           ______________________________  [name], M.D. [facility]     BB/MEDQ  D:  01/30/2006  T:  01/31/2006  Job:  [id number]   cc:   [name], M.D.  [address]  [address]  [place]  [place] [address]   Juan Sinner, MD  Fax: [phone]

## 2011-02-12 NOTE — H&P (Signed)
NAME:  [name], [name]                           ACCOUNT NO.:  [id number]   MEDICAL RECORD NO.:  [id number]                   PATIENT TYPE:  INP   LOCATION:  4707                                 FACILITY:  [facility]   PHYSICIAN:  Sean A. Everardo All, M.D. [facility]           DATE OF BIRTH:  1951-05-14   DATE OF ADMISSION:  01/21/2004  DATE OF DISCHARGE:                                HISTORY & PHYSICAL   REASON FOR ADMISSION:  Shortness of breath.   HISTORY OF PRESENT ILLNESS:  The patient is a 60 year old man with three  weeks of progressive shortness of breath which is now moderate to severe.  This associated pressure-type substernal chest pain which is nonexertional.  He also has a dry quality cough.   PAST MEDICAL HISTORY:  1. Type 2 diabetes.  2. Diabetic nephropathy.  3. Cigarette smoker.  4. COPD.  5. Osteoarthritis of the lumbar spine.  6. Diabetic retinopathy.  7. Dyslipidemia.  8. Obstructive sleep apnea.  9. In 2004, he had a chest x-ray done for shortness of breath which showed     perhaps some fluid retention. He had an echocardiogram and Cardiolite.     After this, it was determined that no cardiology follow-up was needed.   MEDICATIONS:  1. Actos 45 mg a day.  2. Glyburide 10 mg q.a.m.  3. Aspirin 81 mg daily.  4. Diltiazem-XR 180 mg daily.  5. Metformin 1000 mg b.i.d.  6. Diovan 160/12.5 one daily.  7. Crestor 20 mg daily.  8. Darvocet N-100 as needed for back pain.   SOCIAL HISTORY:  The patient is single. He is here with his girlfriend and  he works as a IT trainer.   FAMILY HISTORY:  Negative for his respiratory distress.   REVIEW OF SYSTEMS:  Slight anxiety, depression, and urinary incontinence,  but he denies following: Fever, weight change, syncope, stridor, nausea,  vomiting, rectal bleeding, hematuria, and decreased urinary force.   PHYSICAL EXAMINATION:  VITAL SIGNS: Blood pressure 140/80, heart rate 100,  temperature 97.4, weight 342.  GENERAL: Slight  respiratory distress. He has oxygen on.  SKIN: Not diaphoretic.  HEENT: No periorbital swelling. Pharynx reveals no erythema.  NECK: Supple. No goiter.  CHEST: Clear to auscultation. No respiratory distress.  CARDIOVASCULAR: No JVD. There is 2+ bilateral pretibial edema. Tachycardia.  Regular rate and rhythm. No murmur. Pedal pulses are difficult to  appreciate, probably due to his edema.  Carotid arteries show no bruit.  ABDOMEN: Examination is severely limited by obesity, but is soft, nontender.  No hepatosplenomegaly or masses.  LYMPH NODES: There is no palpable lymphadenopathy of the neck or groin.  MUSCULOSKELETAL: Gait is observed in the office to be normal. Muscle, bulk,  and strength appear to be diffusely normal.  NEUROLOGIC: Alert, well oriented. Does not appear to be anxious or  depressed. Sensation is intact to touch in the feet.   LABORATORY STUDIES:  Oxygen saturation on room air is 82%.   IMPRESSION:  1. Shortness of breath. Most likely cause is chronic obstructive pulmonary     disease exacerbation.  2. Noncardiogenic chest pain. Pulmonary emboli needs to be excluded as an     etiology.  3. Edema which is contributed to by medications, but this should not be     presumed to be the only possible cause.  4. Diabetes. Control of his could be exacerbated by steroids which are some     times for chronic obstructive pulmonary disease exacerbation.  5. Other chronic medical problems as noted above.  6. Slight urinary incontinence.  7. Slight anxiety and depression.   PLAN:  1. Admit to telemetry.  2. Oxygen.  3. Check electrocardiogram.  4. Check CT scan of the chest to exclude pulmonary emboli.  5. Bronchodilators.  6. Discontinue Actos.  7. Will give p.r.n. insulin.  8. Will evaluate his mild anxiety, depression, and incontinence electively,     if necessary.  9. I discussed code status with the patient. He states he wants to be full     code, but would not be  started or maintained on artificial life support     systems if there was a reasonable chance for a functional recovery.  10.      The patient is very hesitate to enter the hospital apparently     because of insurance reasons. I have advised him that he faces a     significantly high risk to his health by declining immediate hospital     admission and he agrees to admission.                                               Sean A. Everardo All, M.D. [facility]    SAE/MEDQ  D:  01/21/2004  T:  01/21/2004  Job:  [id number]

## 2011-02-12 NOTE — Discharge Summary (Signed)
[name], [name] NO.:  [id number]   MEDICAL RECORD NO.:  [id number]          PATIENT TYPE:  INP   LOCATION:  4742                         FACILITY:  [facility]   PHYSICIAN:  [name], MD, FACCDATE OF BIRTH:  Feb 10, 1951   DATE OF ADMISSION:  10/07/2006  DATE OF DISCHARGE:                               DISCHARGE SUMMARY   PRIMARY CARDIOLOGIST:  Dr. [name].   PRIMARY CARE PHYSICIAN:  Ursula Beath, Patrcia Dolly [facility].   PULMONOLOGIST:  Charlaine Dalton. Sherene Sires, M.D.   PRINCIPAL DIAGNOSES:  1. Acute on chronic systolic congestive heart failure.  2. Coronary artery disease.      a.     Status post non-ST elevation myocardial infarction, May of       2007, complicated by in-house inferior ST-elevation.  b  status post percutaneous intervention on the right coronary artery,  left anterior descending and diagonal.  Ejection fraction found to be  35%.   SECONDARY DIAGNOSES:  1. Advanced chronic obstructive pulmonary disease on home O2.  2. Morbid obesity.  3. Diabetes.  4. Hypertension.  5. Sleep apnea.  6. Hyperlipidemia.  7. Anxiety.  8. History of spinal infusion.   PROCEDURES PERFORMED DURING HOSPITALIZATION:  Cardiac catheterization on  October 10, 2006, completed by Dr. Randa Evens.  A left main  angiographically normal LAD.  A moderate size vessel giving rise to 2  diagonals.  The first diagonal is very large and has a 40% restenosis at  the prior site of cutting balloon angioplasty.  The previously placed  drug-eluting stent in the mid LAD is widely patent.  There is normal  flow in the small jail diagonal.  The distal LAD is diffusely and  severely diseased with up to 95% stenosis.  As compared with May of  2007, there has been substantial progression of disease in the apical  LAD; however, at this point, the vessel was small and diffusely diseased  with a diameter of well under 2 mm in the segment in question.  Circumflex:   Moderate size vessel giving rise to 2 marginales and 2  posterior left ventricular branches.  The AV grove circumflex is distal  to the second marginal, has 70% stenosis.  This is unchanged from prior.  Right coronary artery is a moderate size, dominant vessel.  There is a  previously placed stent in the distal vessel.  There was no in-stent  restenosis.   RECOMMENDATIONS:  Moderately impaired left ventricular systolic  function, including apical dyskinesis with an EF of 35%.  He has  substantial progression in the atherosclerotic disease in the distal  left anterior descending artery.  However, even prior to that the vessel  was severely and diffusely diseased and is being well under 2 mm in  diameter.  Elevated left ventricular end diastolic pressure.  Plan  medical therapy for angina and heart failure per Dr. Samule Ohm.   HISTORY OF PRESENT ILLNESS:  This is a 60 year old morbidly obese  Caucasian male with multiple medical problems as stated above, who was  admitted on October 07, 2006 with continued  intermittent chest pain,  increasing since Christmas with multiple episodes of rest pain requiring  2-3 of nitroglycerin sublingual.  Over the week prior to admission, he  had a predominant symptom of progressive heart failure with class IV  symptoms to the point where he could barely walk from his bed to the  other side of his trailer.  He was brought to the emergency room and was  found to be in marked respiratory distress.   HOSPITAL COURSE:  On arrival to the emergency room, chest x-ray showed  congestive heart failure.  EKG showed left bundle branch block.  This  was not identified immediately in the ER.  His last chest pain was 2  days prior to admission.  The initial cardiac markers showed a troponin  of 6.4 with an MB of 3.8.  The patient was given IV diuresis, but he was  very short of breath.   On admission, the patient's blood pressure was 134/88 with a heart rate  of 100,  temperature of 97.5, saturating 95% on a face mask.  The  patient's x-ray showed massive cardiomegaly with CHF.  The patient was  diagnosed with a non-ST elevated MI per Dr. Arvilla Meres on his  assessment of the patient in the ER.  The patient's BNP, on admission,  was 559, troponin was found to be 2.69 on admission.  No further cardiac  enzymes were cycled during his hospital course.   The patient was diuresed during hospitalization with improvement of  symptoms.  The patient was scheduled for cardiac catheterization on  October 10, 2006.  This was completed by Dr. Samule Ohm with the results as  described above.  Post cardiac catheterization, the patient recovered  well and had no complications.  There were no arrhythmias or bleeding or  infection seen.  The following day, the patient continued his diuresis  and has, with several liters of fluid, a total of approximately 8 liters  were diuresed through period of hospitalization.  The patient was found  to be moderately hypokalemic after excessive diuresis with potassium of  3.6.  This was repleted.  The patient was started on spironolactone and  digoxin during hospitalization per Dr. Antoine Poche.  His medications were  adjusted as a result of this.  On day of discharge, the patient's  potassium was found to be mildly elevated at 5.3.  His potassium was  held on that day and was to be resumed on the following day after  discharge.  The patient continued breathing treatments throughout  hospitalization and had substantial improvement of his symptoms.  On day  of discharge, the patient was seen and examined by Dr. Antoine Poche and  found stable for discharge.  Weight on discharge 133.2 pounds.  Blood  pressure, on discharge, 113/73.  Heart rate 76.  Respirations 18.  Temperature 98.1.   LABS ON DISCHARGE:  Hemoglobin 12.9, hematocrit 38.1, white blood cells  is 11.3, platelets 343.  PT 13.9, INR 1.1, PTT 43.  Sodium 140, potassium 5.3,  chloride 99, CO2 31, glucose 157, BUN 21, creatinine  0.96, magnesium 2.1, calcium 9.8.  Patient's hemoglobin A1c was found to  be elevated on his admission at 9.8.  Patient's cholesterol was 145,  triglycerides 101, HDL 28, LDL 97.  TSH 1.890.   Echo completed on October 10, 2006, revealed extremely poor acoustic  windows.  Could not fully evaluate LV function, since it is not well  seen, would consider MUGA.  Left ventricular wall thickness was  mildly  increased.  The left atrium was moderately dilated.  The right  ventricular systolic function was mildly reduced.   FOLLOWUP APPOINTMENTS AND PLANS:  1. The patient is to see Dorian Pod, NP, in the [facility]      Cl[facility] on January 29 at 3:40 p.m.  2. The patient will have blood work drawn to include a BMET on      Thursday, January 24 prior to that admission with results sent to      Ms. Bednar's office.  3. A social worker will see patient on October 14, 2006 for followup      concerning assistance with medications, home O2.  4. The patient has been counseled very urgently by Dr. Antoine Poche to      follow medical compliance and dietary compliance as this will be a      life threatening situation should he be nondiligent.   The patient is unable to receive transportation under his current  insurance.  The patient has been advised that he will need to make  arrangements for transportation to the office for followup appointments.   DISCHARGE MEDICATIONS:  1. Digoxin 0.25 mg daily (new prescription).  2. Spironolactone 25 mg 1 p.o. daily (new prescription).  3. Metoprolol 50 mg b.i.d. (new lower dose).  4. Potassium 40 mg twice a day.  5. Aspirin 325 once a day.  6. Levothroid 0.1 mg daily.  7. Diovan 320 mg daily.  8. Sorbitol 3 mL twice a day.  9. Plavix 75 mg daily.  10.Lipitor 80 mg daily.  11.Isosorbide 120 mg daily.  12.Home O2.   ALLERGIES:  PATIENT IS ALLERGIC TO RAMIPRIL AND PIOGLITAZONE.   Time spent with  patient, to include physician time, on discharge 40  minutes.      Bettey Mare. Lyman Bishop, NP      [name], MD, [facility]  Electronically Signed    KML/MEDQ  D:  10/13/2006  T:  10/14/2006  Job:  [id number]   cc:   Ursula Beath, MD  Charlaine Dalton. Sherene Sires, MD, FCCP

## 2011-02-12 NOTE — Discharge Summary (Signed)
NAMESTOKELY, [name] NO.:  [id number]   MEDICAL RECORD NO.:  [id number]          PATIENT TYPE:  INP   LOCATION:  5527                         FACILITY:  [facility]   PHYSICIAN:  Melina Fiddler, MD DATE OF BIRTH:  11-08-1950   DATE OF ADMISSION:  09/07/2005  DATE OF DISCHARGE:  09/08/2005                                 DISCHARGE SUMMARY   CONSULTATIONS:  None.   PROCEDURES:  None.   DISCHARGE DIAGNOSES:  1.  Chest pain.  2.  Chronic obstructive pulmonary disease.  3.  Diabetes.  4.  Hypertension.  5.  Hypothyroid.  6.  Hypercholesterolemia.   DISCHARGE MEDICATIONS:  1.  Lasix 40 mg daily.  2.  Aspirin 325 mg daily.  3.  Synthroid 100 mcg daily.  4.  Spironolactone 50 mg t.i.d.  5.  Diovan 320 mg daily.  6.  Crestor 10 mg daily.  7.  Humalog 75/25 100 units q.a.m., 40 units q.p.m.  8.  __________ 8 mEq daily.  9.  Metoprolol 25 mg b.i.d.   HOSPITAL COURSE:  The patient is a 60 year old gentleman who presented to  the [facility] office for a routine follow up at which time  he noted 45 minutes of sharp chest pain which started in his right lower  chest and moved to the left lower chest associated with shortness of breath  and nausea, but no vomiting.  He also had an episode of syncope or near  syncope which was not witnessed which was described by the patient as I got  dizzy and fell out of my chair.   PHYSICAL EXAMINATION:  VITAL SIGNS:  Temperature 97.9, heart rate 107, blood  pressure of 187/100 and his weight was 281.9 pounds.  GENERAL APPEARANCE:  In general, he was ill appearing, but alert and  oriented.  HEENT:  Within normal limits.  LUNGS:  Distant breath sounds without crackles.  HEART:  Distant heart sounds with no appreciable murmurs or gallops.  ABDOMEN:  Obese, soft, nontender, nondistended with normal bowel sounds.  EXTREMITIES:  He had 2+ to 3+ pitting edema of his lower extremities.   LABORATORY DATA:  His EKG was  within normal limits.  His CBG was 203.  Other  labs on admission were a BNP of 113.9, a D-dimer of 0.39.  A CBC with a WBC  of 11.9, hemoglobin 13.4, hematocrit 38.2, platelets of 285,000.  Sodium of  138, potassium of 3.3, chloride of 104, bicarb of 25, BUN 7, creatinine 0.7,  glucose 179.  T. bilirubin was 0.8.  Alkaline phosphatase was 105.  AST of  16, ALT of 20.  C-protein 6.2, albumin 3.5, calcium 9.2.  His cardiac  enzymes were negative x3.   HOSPITAL COURSE:  Problem 1. Chest pain.  The patient was admitted for a 23-  hour observation to rule out myocardial infarction.  His cardiac enzymes  were negative x3.  On the morning following admission, the patient stated  that his pain was much improved.  The patient has known congestive heart  failure, but does not appear  to be extremely volume overloaded on exam.  We  continued his Lasix at his home dose while in the hospital and his edema  improved.  Due to his known congestive heart failure, the patient was  started on metoprolol 25 mg b.i.d. while inpatient and will continue this  regimen as an outpatient and should follow up with his primary care  physician, Dr. Raquel James regarding this matter.   Problem 2. COPD.  The patient states he is on home oxygen and we continued  his oxygen while he was here.  He is on no controller medications for his  COPD and this is something that will need to be followed up as an outpatient  with his primary care physician as no controller medications were started  here during his brief inpatient stay.   Problem 3. Diabetes.  This patient's diabetes is not controlled.  He is  seeing Dr. Everardo All at __________ for endocrinology in the past and per his  old medical records, the patient refuses to use greater than 2 injections  per day.  Because of this, he will be continued on his home medication of  Humalog 35/25, 100 units in the morning and 40 units at night.  He was  covered with sliding scale  insulin while in the hospital, but refuses to  continue his sliding scale insulin at home.  So, this is an issue that will  need to be followed up again with his primary care physician as an  outpatient.   Problem 4. Hypertension.  The patient's blood pressures were slightly  elevated while in the hospital.  His home regimen on admission was Diovan  325 mg daily, Lasix 40 mg daily, spironolactone 50 mg t.i.d.  While he was  an inpatient, we added metoprolol 25 mg b.i.d. to his home regimen in hopes  of not only controlling his hypertension, but to assist with his congestive  heart failure.   Problem 5. Hypothyroidism.  The patient has a known history of hypothyroid.  He was admitted on his home dose of 100 mcg of Synthroid.  This was  continued as an inpatient and will be continued as an outpatient.   Problem 6. Hypercholesterolemia.  The patient was admitted on Crestor 10 mg  daily.  This was to be continued on his discharge.  His lipid panel while in  the hospital showed a cholesterol of 157, an HDL of 36, an LDL of 95 and  triglycerides of 130.  We opted not to change his home medication regimen  and this also will need to be followed up with his primary care physician as  an outpatient.   DISCHARGE LABORATORY DATA:  His labs on the day of discharge were showing  cardiac enzymes which returned negative x3 and he had a repeat EKG which was  normal sinus rhythm.   ISSUES TO FOLLOW UP ON DISCHARGE:  1.  The patient has a follow up appointment with his primary care physician,      Dr. Ursula Beath at [facility] on Thursday, 12/21      at 3 p.m.  2.  To consider, as an outpatient, adding controller medications for Mr.      Manges's COPD.  3.  It would probably be beneficial to discuss this patient's home diabetes      regimen as his diabetes is not optimally controlled, but the patient is      refusing to use injections.     Natalia Leatherwood  Marice Potter, M.D.     ______________________________  Melina Fiddler, MD    KF/MEDQ  D:  09/09/2005  T:  09/11/2005  Job:  [id number]   cc:   Ursula Beath, MD  Fax: [phone]

## 2011-02-12 NOTE — Consult Note (Signed)
[name]                 ACCOUNT NO.:  [id number]   MEDICAL RECORD NO.:  [id number]          PATIENT TYPE:  INP   LOCATION:  2901                         FACILITY:  [facility]   PHYSICIAN:  [name], M.D.   DATE OF BIRTH:  [birth year]   DATE OF CONSULTATION:  01/24/2006  DATE OF DISCHARGE:                                   CONSULTATION   CARDIOLOGY CONSULTATION   REASON FOR CONSULTATION:  We are asked to consult on [name], a 60-year-  old white male with progressive shortness of breath and congestive heart  failure on his chest x-ray.  He has had several days of dyspnea on exertion  and progressive increased shortness of breath. He had orthopnea and  paroxysmal nocturnal dyspnea and was brought to the emergency room by EMS.   He has also had some left sided chest aching two to three times over the  past week.  It is not related to exertion, though his activity is extremely  limited.  He was given nitroglycerin today and had some relief from his  chest pain.  He received 100 mg of intravenous Lasix en route and has  diuresed 1500 cc.   PAST MEDICAL HISTORY:  He has a history of hypertension since 1999. He has  chronic obstructive lung disease. He has obstructive sleep apnea on CPAP.  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  He is morbidly obese.  He has hypothyroidism, hyperlipidemia.  He has had some mild diastolic  congestive heart failure in the past, ejection fraction 45%.  His BNP's,  however, have been low.  He had a lumbar fusion in 1987, tonsillectomy and  adenoidectomy in the past and he has had a perineal abscess removed.   MEDICATIONS:  His medications on admission included:  1.  Aldactone 50 mg t.i.d.  2.  Aspirin 325 mg daily.  3.  Crestor 10 mg a day.  4.  Diovan 325 mg a day.  5.  Lasix 40 mg a day.  6.  Insulin 75/25, 16 in the morning, 15 in the afternoon.  7.  Thyroid 0.1 mg daily.  8.  Toprol XL 50 mg daily.  9.  O2 at 2L nasal  cannula.  10. Albuterol.  11. Sorbitol b.i.d.  12. Potassium 8 mEq daily.   ALLERGIES:  He is intolerant of ACTOS and ALTACE which causes a cough.   SOCIAL HISTORY:  He lives in [place] in a mobile home.  He lives alone.  He is a disabled Naval architect.  He is divorced. He has not smoked since  2006.  He does not use, does not use recreational drugs.   FAMILY HISTORY:  Not helpful, negative for premature coronary artery  disease.   REVIEW OF SYSTEMS:  He has chronic constipation. He has had problems with  dizziness and balance issues for some time.   PHYSICAL EXAMINATION:  GENERAL APPEARANCE:  He is in the emergency room,  sitting upright with his Bi-PAP on.  He has an oxygen saturation of 97%, 93%  on 5L.  He is  a very chronically ill, disheveled, male who is quite  pleasant. He is having a lot of difficulty even breathing.  VITAL SIGNS:  Blood pressure 142/72, pulse 84 and regular. He is in sinus  rhythm.  His respiratory rate is 34.  He is afebrile.  HEENT:  Normocephalic, atraumatic. Pupils equal, round, reactive to light  and accommodation. Extraocular muscles intact. Sclerae are clear.  NECK:  Reveals good upstrokes. Jugular venous distention was difficult to  assess.  There are no bruits.  CARDIOVASCULAR:  Examination reveals a soft S1 and S2 without gallop.  LUNGS:  Reveal rales half way up.  ABDOMEN:  Extremely obese.  There is no obvious tenderness.  Bowel sounds  are present. Organomegaly cannot be assessed.  EXTREMITIES:  Reveal no cyanosis or clubbing, 2+ edema. Pulses were present  but slightly reduced with the edema.  NEUROLOGICAL:  Grossly intact.   CLINICAL DATA:  Chest x-ray shows cardiomegalia with pulmonary vascular  congestion centrally.  He may have a small left pleural effusion.  Electrocardiogram shows a new left bundle branch block pattern.   LABORATORY DATA:  His laboratory data shows positive troponin 0.7 and his  CPK-MB is 21.  The rest of his  blood work is within normal limits except for  a glucose of 248.  His BNP actually was 453.   ASSESSMENT:  1.  Congestive heart failure.  His ejection fraction has been difficult to      assess in the past because of his morbid obesity. His ejection fraction      has been mildly decreased.  He probably has some component of systolic      and diastolic dysfunction.  He has multiple cardiac risk factors and I      am concerned that he has had an ischemic event or has unstable angina.  2.  Other problems as listed above under his past medical history.   RECOMMENDATIONS:  1.  Agree with diuresis with Lasix 80 mg q.8h.  2.  Follow closely his BMP's and replace potassium, etc.  3.  Diastolic catheterization probably in two days once he is able to lay      flat.  4.  I would continue his aspirin as well as his Diovan and Statin.  5.  I would treat with unfractionated heparin.   Thank you very much for the consultation.      Thomas C. Wall, M.D.  Electronically Signed     TCW/MEDQ  D:  01/24/2006  T:  01/25/2006  Job:  [id number]   cc:   [name] All, M.D. [facility]  [address]  [place]  [place] [address]   [name], M.D.  [address]  [address]  [place]  [place] [address]

## 2011-02-12 NOTE — H&P (Signed)
[name], [name]                 ACCOUNT NO.:  [id number]   MEDICAL RECORD NO.:  [id number]          PATIENT TYPE:  EMS   LOCATION:  ED                           FACILITY:  [facility]   PHYSICIAN:  Sean A. Everardo All, M.D. [facility] OF BIRTH:  1951/07/25   DATE OF ADMISSION:  07/06/2005  DATE OF DISCHARGE:                                HISTORY & PHYSICAL   REASON FOR ADMISSION:  Shortness of breath.   HISTORY OF PRESENT ILLNESS:  A 60 year old man with about 1 month of dyspnea  in the context of slight exertion. He has associated increasing edema  despite an increase in his Lasix by Dr. Sherene Sires last week.   He has several months of rust colored urine which is painless.   He also has intermittent chest pain and chills which are nonexertional.   PAST MEDICAL HISTORY:  1.  Type 2 diabetes.  2.  Diabetes nephropathy.  3.  He recently quit smoking.  4.  Chronic obstructive pulmonary disease.  5.  Disc disease of the lumbar spine.  6.  Diabetes retinopathy.  7.  Dyslipidemia.  8.  Sleep apnea.  9.  He was seen by cardiology in 2005 when a stress Cardiolite study showed      an ejection fraction of 0.49, an a probable prior infarction in the      inferior area. He was felt to have either __________ ischemia or normal      perfusion.   MEDICATIONS:  1.  Aspirin 81 mg a day.  2.  Glucophage 1000 mg twice daily.  3.  75/25 insulin, 120 units q.a.m. and 40 units q.p.m.  4.  Oxygen at 2 liters per minute.  5.  Synthroid 50 mcg a day.  6.  Diovan/HCT 160/12.5, 1 daily.  7.  Extended released diltiazem 180 mg daily.  8.  Protonix 40 mg twice daily.  9.  Crestor 20 mg daily.  10. Albuterol nebulized therapy every 4 hours as needed for shortness of      breath.   SOCIAL HISTORY:  He is separated from his wife and states that he is  becoming increasingly estranged from family members.   FAMILY HISTORY:  Negative for breathing problems.   REVIEW OF SYMPTOMS:  He has lost about 10 pounds in  the past year and he has  some depression. He denies syncope, cough, rectal bleeding, decreased  urinary force and incontinence. He also denies fever.   PHYSICAL EXAMINATION:  VITAL SIGNS:  Blood pressure 109/105, heart rate 129,  respiratory rate 20, temperature 97.1, weight is 298.  GENERAL:  Slight distress due to shortness of breath.  SKIN:  Not diaphoretic, no rash.  HEENT:  No proptosis, no periorbital swelling. Pharynx no erythema.  NECK:  Supple, no goiter.  CHEST:  Clear to auscultation except for a few rales at the base.  CARDIOVASCULAR:  No JVD, no edema. Tachycardic, regular rhythm, no murmur.  Pedal pulses are intact.  ABDOMEN:  Soft, nontender, no hepatosplenomegaly, no mass. Obesity limits  the examination.  GENITAL/RECTAL:  Not done at this time due  to patient condition.   LABORATORY DATA:  Pulse oximetry on 2 liters per minute is 75%.   Urinalysis in the office today is positive for blood.   IMPRESSION:  1.  Respiratory failure of uncertain etiology.  2.  Diabetes the status of which is uncertain.  3.  His social history with his lack of health insurance and estrangement      from family members appears to be compromising his willingness and      ability to care for himself.  4.  Hematuria uncertain etiology.  5.  Chest pain of uncertain etiology.   PLAN:  1.  Admit to [facility], telemetry.  2.  Chest laboratory studies including BNP and D-dimer.  3.  Check CPK and troponin.  4.  Chest x-ray and electrocardiogram.  5.  Increase Lasix and add Atrovent to [facility] therapy.  6.  Elective workup of hematuria.  7.  Insulin at an empiric dosage for now.  8.  Check glucoses q.i.d. in the hospital.  9.  I discussed the code status with the patient and he requests full code.      However, he states he would not wanted started nor maintained on      artifical life support measures if there was not a reasonable chance of      a functional recovery.            ______________________________  Cleophas Dunker Everardo All, M.D. [facility]     SAE/MEDQ  D:  07/06/2005  T:  07/06/2005  Job:  [id number]

## 2011-02-12 NOTE — Discharge Summary (Signed)
[name], [name]                 ACCOUNT NO.:  [id number]   MEDICAL RECORD NO.:  [id number]          PATIENT TYPE:  INP   LOCATION:  6527                         FACILITY:  [facility]   PHYSICIAN:  Leighton Roach McDiarmid, M.D.DATE OF BIRTH:  04-May-1951   DATE OF ADMISSION:  01/24/2006  DATE OF DISCHARGE:  02/02/2006                                 DISCHARGE SUMMARY   DISCHARGE DIAGNOSES:  1.  Non-ST myocardial infarction/unstable angina.  2.  Right coronary artery occlusion with percutaneous coronary intervention.  3.  Left anterior descending coronary artery occlusion status post stent      placement.  4.  Congestive heart failure exacerbation.  5.  Chronic obstructive pulmonary disease.  6.  Hypertension.  7.  Diabetes type 2.   ADDITIONAL CHRONIC MEDICAL PROBLEMS:  1.  Obesity.  2.  Sleep apnea.  3.  Hypothyroidism.  4.  Hypercholesterolemia.  5.  Anxiety.  6.  Constipation.   CONSULTANTS:  1.  Valera Castle, MD, cardiology.  2.  [name], MD, cardiology.  3.  [name] MD, cardiology.  4.  Sandrea Hughs, MD, pulmonologist.   PRIMARY PHYSICIAN:  Ursula Beath MD, family medicine teaching service.   DISCHARGE MEDICATIONS:  1.  Aspirin 325 mg one tablet daily.  2.  Diovan 320 mg daily.  3.  Lipitor 80 mg daily.  4.  Metoprolol 50 mg b.i.d.  5.  Nitroglycerin sublingual 0.4 mg one tablet every 5 minutes p.r.n.  6.  NovoLog 70/30 80 units each morning, 50 units each evening.  7.  Plavix 75 mg daily.  8.  Sustained-release potassium chloride 8 mEq one tablet b.i.d.  9.  Sorbitol 70% solution 30 mL once a day as needed.  10. Spironolactone 25 mg daily.  11. Synthroid 100 mcg daily.  12. Ventolin two puffs as needed q.4h. p.r.n.  13. Lasix 40 mg b.i.d.   PROCEDURES:  1.  Cardiac catheterization Jan 27, 2006.  Impression:  Moderate left      ventricular dysfunction, coronary artery disease with diffuse branch      vessel and distal vessel disease.  Discrete mid LAD  lesion.  Elevated      pulmonary pressures related to LV and diastolic pressure elevation.  2.  Cardiac catheterization Jan 29, 2006.  Impression:  Acute diaphragmatic      wall infarction with 95% stenosis in the distal right coronary artery,      multiple 90-95% stenoses in the distal circumflex artery, 90% stenosis      in the proximal LAD, with 90% stenosis in the first diagonal branch and      inferobasal room wall akinesis and global hypokinesis, with an estimated      fraction of 35%.  Successful PCI of the lesion in the distal right      coronary using a HORIZON study stent with improvement in central      narrowing from 95% to 0% and improvement in the flow from TIMI 2 to TIMI      3 flow.  3.  Cardiac catheterization Feb 01, 2006.  Impression:  Successful PCI of the      lesion in the mid left anterior descending artery using Taxus drug-      eluting stent with improvement in center of narrowing from 90% to 0%.      Successful PCI of the ostial lesion of the first diagonal branch off the      left anterior descending using a cutting balloon with improvement in      center of narrowing from 95% to less than 20%.  The patient should      remain on Plavix for least a year and possibly long-term.   HOSPITAL COURSE:  The patient is a 60 year old white male admitted with  progressive shortness of breath and congestive heart failure on chest x-ray.  He had several days of dyspnea on exertion and progressive increased  shortness of breath.   #1 - NON-ST MYOCARDIAL INFARCTION.  In addition to the above chief complaint  the patient also explained later that he had some left-sided chest aching  two to three times earlier in the week.  It was not related to exertion.  He  was given some nitroglycerin in the ED when the pain resumed to his relief.  He was given 100 mg of intravenous Lasix en route and diuresed 1500 mL in  the ED.  On EKG the patient was found to have a new left  bundle-branch  block.  On initial consultation with cardiology it was decided that he was  too far away from the initial chest pain complaint to warrant thrombolytics  and it was decided that he should be admitted to the step-down unit for  medical management.  On hospital day #3 the patient was catheterized with  the above results.  At that time it was determined that medical management  would be maximally effective and that repeat catheterization should be  scheduled if the patient failed maximal medical therapy.  Two days later at  11:30 p.m. the patient was awoken by severe pressing chest pain responsive  to nitroglycerin and it was thought that he had equivocal evidence of ST  elevations on EKG.  Cardiology on-call team took him for urgent  catheterization and stent was placed in the distal RCA as detailed above.  In conjunction with the second catheterization and upon review of first  catheterization, cardiology team decided to schedule elective PCI to go back  in and alleviate the LAD occlusion.  This was done on May 8 with Taxus drug-  eluting stent placed as noted above.  Following the third catheterization  the patient was deemed stable and was discharged from cardiology service.  Recommendations included titration of appropriate medications and  implementation of Plavix with 1 year, possibly lifetime, duration.   #2 - CONGESTIVE HEART FAILURE.  This was the patient's initial chief  complaint with initial chest x-ray showing cardiomegaly with central  pulmonary vascular congestion and probable left pleural effusion.  The  patient was also noted to have bibasilar atelectasis.  The patient was  admitted and put on IV Lasix.  The patient's last known dry weight was 260  pounds and admission weight was 311 pounds.  Dry weight was last taken 6  months ago.  The patient was maintained on steady diuresis of Lasix 60 mg b.i.d.  At one point during the stay cardiology increased him to  continuous  Lasix infusion 10 mg per hour.  The patient was consistently 1.5 to 2.5 L  negative each day and by discharge the  patient had diuresed off more than 25  pounds of fluid.  The patient's clinical response was quite evident as well  with increased work of breathing, elimination of orthopnea, and a reduction  of lower extremity edema from 3+ to 1-2+.  The patient was discharged home  on a higher than usual dose of Lasix with close follow-up to the family  practice clinic for continued fluid reduction.  Subsequent chest x-rays just  prior to discharge showed continuous improvement of his cardiogenic  pulmonary edema.   #3 - HYPERTENSION.  Throughout the patient's stay blood pressure was well  controlled.   #4 - DIABETES TYPE 2.  The patient was maintained on his usual 70/30 mix  with sliding scale insulin.  Sliding scale insulin was triggered  occasionally throughout his stay but in general the patient proved to be  stable per his home dose and subsequently his discharge dose.   #5 - HYPOTHYROIDISM:  The patient was stable for this complaint.  Synthroid  was continued throughout his stay.   #6 - SLEEP APNEA.  The patient was maintained during his stay on CPAP.  It  should also be noted in conjunction with earlier hospital course notes, the  patient does have a 2 L per minute nasal cannula O2 requirement at home.  The patient was continued on his home regimen of O2 at discharge.   RADIOLOGY:  1.  Chest x-ray, January 14, 2006.  Impression:  Cardiomegaly with central      pulmonary vascular congestion.  Probable small left pleural effusion.      Bibasilar atelectasis.  2.  Chest x-ray, Jan 25, 2006:  No significant change.  3.  Chest x-ray, Jan 26, 2006.  Impression:  Slight improvement of aeration      at the bases.  4.  Chest x-ray, Jan 30, 2006:  Stable cardiomegaly with pulmonary vascular      congestion.  Noted upon review to be improved over prior films.   FOLLOW-UP:  The  patient is a clinic patient of Dr. Era Bumpers of the  family medicine teaching service.  The patient was scheduled for close  follow-up at the [facility].  In addition, the patient was  consulted by case management and social work during his stay to help in  medication assistance.  Lastly, the patient was sent home with home health  nursing for close follow-up on a daily basis.  On discharge the patient was  instructed to return to the [facility] as previously scheduled  and to follow up with cardiology service per their instructions and with  guidance from family practice.      Towana Badger, M.D.    ______________________________  Leighton Roach McDiarmid, M.D.    JP/MEDQ  D:  02/02/2006  T:  02/03/2006  Job:  [id number]

## 2011-02-12 NOTE — H&P (Signed)
[name], [name] NO.:  [id number]   MEDICAL RECORD NO.:  [id number]          PATIENT TYPE:  INP   LOCATION:  1830                         FACILITY:  [facility]   PHYSICIAN:  Leighton Roach McDiarmid, M.D.DATE OF BIRTH:  [birth year]   DATE OF ADMISSION:  01/24/2006  DATE OF DISCHARGE:                                HISTORY & PHYSICAL   HISTORY OF PRESENT ILLNESS:  Mr. [name] is a 60 year old obese white male  with a history of dyspnea since April 22. He noticed that his legs were with  increasing fluid, had shortness of breath associated with inability to lay  down. He had to sit in his easy chair for the entire week. He had increasing  edema and decreased movement around his house although he lives alone,  rarely leaves and has 2 liter oxygen dependence. This weekend, 2 days ago,  he experienced chest pain mid sternal like someone punching him and had even  more dyspnea with inability to walk around his home from his chair to the  bathroom. He called his sister this morning, she sent him to the ED. They  felt he was in CHF exacerbation and gave him Lasix and put him on BiPAP, he  is feeling much better now.   REVIEW OF SYSTEMS:  Significant only for chest pain as above without  palpitations, nausea, vomiting, diarrhea, or diaphoresis. PULMONARY:  He has  had a mild cough with clear sputum for one week. SKIN:  Has multiple cat  scratches.   PAST MEDICAL HISTORY:  1.  CHF with an EF of 45% per November 2006 echo.  2.  Chronic obstructive pulmonary disease.  3.  Hypertension.  4.  Diabetes mellitus type 2.  5.  Obesity.  6.  Sleep apnea.  7.  Hypothyroidism.  8.  Hyperlipidemia.  9.  History of portal effusions in October 2006.   MEDICATIONS:  Unclear which ones he has been taking or how compliant he has  been in the last several months due to cost of medications.   1.  Aldactone 50 mg t.i.d.  2.  Aspirin 325 mg daily.  3.  Crestor 10 mg daily.  4.  Diovan 320  mg daily.  5.  Furosemide 40 mg daily.  6.  Insulin 75/25 110 units q.a.m. and 50 units q.p.m.  7.  Synthroid 100 mcg daily.  8.  Toprol XL 50 mg daily.  9.  Oxygen 2 liters nasal cannula.  10. Potassium chloride 8 mEq daily.  11. Sorbitol 70% 2 tablespoons twice a day.  12. Albuterol nebs p.r.n.   ALLERGIES:  No known drug allergies.   PRIMARY CARE PHYSICIAN:  Ursula Beath, MD.   PULMONARY PHYSICIAN:  Charlaine Dalton. Sherene Sires, MD.   [facility]   FAMILY HISTORY:  Mother is alive, unknown health problems. Father is unknown  to the patient. Sister had breast cancer.   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 He has a  sister in [place] and a mother in assisted living. He has very limited  financial  resources and was recently certified with medication assistance  program, history of smoking not in the past 10 years.   PHYSICAL EXAMINATION:  VITAL SIGNS:  93% on 5 liters, 97% on BiPAP. Blood  pressure 142/72, pulse 84, respiratory rate 34.  GENERAL:  This is an obese white male sitting straight in bed with BiPAP, no  acute distress, alert and oriented x3.  HEENT:  PERRLA. EOMI. BiPAP in place, unable to examine oral.  CV:  RRR, no murmurs, S1, S2.  LUNGS:  Crackles to mid back, no rhonchi or wheezes.  ABDOMEN:  __________ soft, mildly distended, no hepatosplenomegaly.  EXTREMITIES:  +3 edema to the knees. Does have evidence of previous venous  stasis ulcerations and stage 1 pressure area on the posterior right calf.  NEUROLOGIC:  Grossly intact. Balance not observed.  SKIN:  With multiple cat scratches on the abdomen.  GU:  Does have a Foley in place, no scrotal edema.   LABORATORY DATA:  BNP 453, CBC within normal limits. Sodium 141, potassium  3.9, chloride 110, CO2 26, BUN 9, creatinine 0.7. Glucose 248, AST 20, ALT  16, alkaline phosphatase 115, bili 0.6, protein 5.8, albumin 3.2. Point of  care enzymes x1 mildly  positive,  troponin 0.06, CK-MB 4.9 and myoglobin  439. ABG, ISTAT, pH 7.41, pCO2 41.8 and PO2 58.   EKG new left bundle branch block, normal sinus rhythm. Significantly changed  from December of 2006. Chest x-ray showed cardiomegaly, central pulmonary  vascular congestion, probable left pleural effusion, bibasilar atelectasis.   ASSESSMENT:  A 60 year old obese white male admitted for dyspnea likely  multifactorial.  a.  Congestive heart failure exacerbation. The patient recently poor  compliance with medications may be significant factor and unclear which  medicines he was taking or not. BNP with mild elevation but increased  pleural effusion and pulmonary edema on exam and chest x-ray, increased  lower extremity edema, EF unclear estimated at 45% in November of 2006. Rule  out for myocardial infarction with his new  left bundle branch block and  symptoms of congestive heart failure. Cardiology to assess for recent  myocardial infarction either within the past week or currently in progress  with a mild increased in troponin and myoglobin. Will treat with Lasix 80 mg  IV q.8 hours, strict I's and O's, daily weights, and continue his current  regimen of Diovan, Lasix, spironolactone and Toprol.  b.  Chronic obstructive pulmonary disease. No evidence of bronchitis or  pneumonia on chest x-ray. He has had a mild cough which seems more plausible  to be secondary to his pulmonary edema. Will place on his BiPAP until his  CHF fluid is resolved. Albuterol nebs as needed. Continue his oxygen and  recheck an ABG as needed.  c.  Will diurese and assess other causes of dyspnea as needed.  d.  Diabetes, unclear. Will check a hemoglobin A1c, continue insulin 75/25  110 units in the morning and 50 units in the p.m. Cover with sliding scale  insulin and consider the addition of p.o. medications such as metformin at  discharge. e.  Hypothyroidism. He is noncompliant with his medication. Will check a TSH  and  restart Synthroid at his current dose of 100 mcg.  f.  Hyperlipidemia. Will check a fasting a lipid panel in the morning and  continue his Crestor.  g.  Constipation. Continue sorbitol.  h.  Prophylaxis. Lovenox 40 mg subcu daily.  1.  Hypertension. Continue home meds and diuresis as  per #1.      Ace Gins, MD    ______________________________  Leighton Roach McDiarmid, M.D.    JS/MEDQ  D:  01/24/2006  T:  01/24/2006  Job:  [id number]

## 2011-02-12 NOTE — Discharge Summary (Signed)
NAME:  [name], [name]                           ACCOUNT NO.:  [id number]   MEDICAL RECORD NO.:  [id number]                   PATIENT TYPE:  INP   LOCATION:  5705                                 FACILITY:  [facility]   PHYSICIAN:  Rene Paci, M.D. [facility]          DATE OF BIRTH:  1951/01/25   DATE OF ADMISSION:  01/21/2004  DATE OF DISCHARGE:  01/28/2004                                 DISCHARGE SUMMARY   DISCHARGE DIAGNOSES:  1. Dyspnea.  2. Respiratory insufficiency.  3. Chronic obstructive pulmonary disease/asthmatic bronchitis.  4. Borderline hypothyroidism.  5. Uncontrolled diabetes.  6. Morbid obesity.   BRIEF ADMISSION HISTORY:  Mr. Dentinger is a 60 year old white male who  presented with a three week history of shortness of breath.  He noted that  he quit smoking two months prior to this development of shortness of breath.  He describes associated pressure in his substernal area.  He notes this is  nonexertional.  He does also complain of a nonproductive cough.   PAST MEDICAL HISTORY:  1. Adult onset diabetes mellitus.  2. Diabetic nephropathy.  3. COPD with continued tobacco use until two months prior to this admission.  4. L spine disk disease.  5. Diabetic retinopathy.  6. Dyslipidemia.  7. Sleep apnea diagnosed many years ago.  8. Status post spinal fusion.  9. Status post T&A.  10.      History of a broken foot.   HOSPITAL COURSE:  1. Pulmonary.  The patient presented with dyspnea and was hypoxic in the     office.  The patient has underlying COPD with recent tobacco use.  The     patient was admitted for a possible acute exacerbation of his COPD.  The     patient also had a pleuritic component to his chest pain.  The patient     had a CT of his chest to rule out pulmonary embolus.  CT chest showed no     evidence of pulmonary emboli but there were numerous bilateral non-     calcified pulmonary nodules.  The larges was approximately 8-mm in the     lateral left  upper lobe.  These were suspicious for metastatic disease.     The patient also had some small bilateral pleural effusions, right     greater than left with slightly enlarged mediastinal and bilateral hilar     lymphadenopathy.  Because of the patient's abnormal CT chest, we did ask     for a pulmonary consult.  The patient was seen in consultation by Dr.     Sherene Sires.  Dr. Thurston Hole impression was that the patient had COPD/asthmatic     bronchitis acute exacerbation with recent smoking cessation.  This also     appears to be associated with massive weight gain.  He also felt that he     was probably borderline hypothyroid with a minimally elevated TSH.  The     patient does carry a diagnosis of sleep apnea and Dr. Sherene Sires wanted to     review the patient's chart for sleep study.  He did review the chart and     felt that the patient probably should have a followup sleep study as an     outpatient, but he was going to defer this till after he followed up with     him in the office.  He was also concerned about cor pulmonale; therefore,     we checked a 2D echo. This revealed preserved LV function with the right     ventricle being normal size and function.  There was no evidence of cor     pulmonale at this time.  At this time, it was felt that his respiratory     insufficiency is likely secondary to acute exacerbation of COPD and     asthmatic bronchitis which has improved with oxygen, nebulizers, and a     prednisone taper.  He also feels part of his issue is probably his     massive weight gain from a mechanical restrictive point-of-view.  It was     felt the patient would require a 24 hour oxygen initially.  Dr. Sherene Sires also     advised aggressive weight management.   1. Adult onset diabetes mellitus.  The patient has uncontrolled diabetes.     This was acutely exacerbated by steroids.  We did add Lantus to the     patient's regimen.  Currently, we are tapering the steroids off, and we     hope  that we can also taper the Lantus off as well.  We have increased     his glyburide to b.i.d.  Initially, the patient will be discharged home     on Lantus.  He has been instructed to check his blood sugars q.a.c.,     h.s., and followup with his primary care physician.  We also had the     nutritionist followup the patient to schedule further outpatient diabetic     nutrition counseling.   1. Morbid obesity.  As noted the patient has had significant weight gain.     His morbid obesity is a large component of his current medical issues.     We did have a nutritionist see the patient.  She talked to him about diet     and weight loss, noting that the patient was quite resistant to change in     his current dietary habits and there is a question that he is probably     going to continue to be noncompliant.  However, we will continue further     followup as an outpatient.   1. Hypothyroid. The patient's TSH was elevated at 5.543.  We will defer     starting the patient on low-dose hormone replacement to his primary care     physician.  Dr. Sherene Sires feels that this may be helpful in assisting in his     weight loss.   LABS AT DISCHARGE:  CBC was normal.  LFTs were normal.  C-MET was normal,  except for elevated glucose.  BNP was 76.7.  TSH was 5.543.  Urinalysis was  negative.   MEDICATIONS AT DISCHARGE:  1. Glyburide 10 mg b.i.d.  2. Cardizem CD 180 mg every day.  3. Aspirin 81 mg every day.  4. Actos 45 mg every day.  5. Metformin 1 gram b.i.d.  6. Diovan/HCT  160/12.5 mg every day.  7. Advair 250/50 one puff b.i.d.  8. Albuterol nebulizer q.4-6h. p.r.n.  9. Crestor 20 mg every day.  10.      Lantus 20 units subcu q.h.s.  11.      Oxygen at 2 liters 24 hours a day.   FOLLOW UP:  Dr. Sherene Sires on Tuesday, Feb 04, 2004 at 9:40 a.m.  Dr. Rennis Harding on Tuesday, Feb 04, 2004 at 11 a.m.      Cornell Barman, P.A. [facility]                  Rene Paci, M.D. [facility]   LC/MEDQ  D:  01/28/2004  T:   01/29/2004  Job:  [id number]   cc:   [name] All, M.D. [facility]   Charlaine Dalton. Sherene Sires, M.D. [facility]

## 2011-02-12 NOTE — Discharge Summary (Signed)
[name], Juan NO.:  [id number]   MEDICAL RECORD NO.:  [id number]          PATIENT TYPE:  INP   LOCATION:  6709                         FACILITY:  [facility]   PHYSICIAN:  Adolph Pollack, M.D.DATE OF BIRTH:  [birth year]   DATE OF ADMISSION:  10/02/2004  DATE OF DISCHARGE:  10/08/2004                                 DISCHARGE SUMMARY   PRINCIPAL DISCHARGE DIAGNOSIS:  Fournier's gangrene.   SECONDARY DIAGNOSES:  1.  Adult-onset diabetes mellitus.  2.  Hypertension.  3.  Hypokalemia.  4.  Chronic obstructive pulmonary disease.   PROCEDURES:  1.  Excision and debridement of Fournier's gangrene in the posterior scrotum      and peroneal body, October 02, 2004.  2.  Dressing change and further wound debridement October 04, 2004.   REASON FOR ADMISSION:  This is a 60 year old male diabetic with a large  peroneal abscess.  He is a poorly controlled diabetic.  He subsequently was  admitted.   HOSPITAL COURSE:  He underwent the above procedures.  Postoperatively, a  medical consultation was obtained from [facility] who handled  his diabetes.  Eventually, we were able to get this under control.  There  was gram-positive cocci.  He was on IV Avalox and hence, we started wound  care on him including dressing changes and pulsatile lavage.  He began to  improve, became afebrile.  The cellulitis and induration improved.  The  wounds were clear.  By October 08, 2004, his blood sugars were under better  control.  The wound was looking good and he was able to be discharged.   DISPOSITION:  Discharged to home October 08, 2004.  We have home health  nurses come to his residence to perform dressing changes.  We will send him  home on some diabetic medications as directed by the medical service as well  as Keflex and Tylox for pain.  I have encouraged him to follow up with Dr.  Gerrit Friends in one week and he also has medical follow up at [facility]  and  possible follow up with Dr. Everardo All.      Todd   TJR/MEDQ  D:  10/08/2004  T:  10/08/2004  Job:  [id number]   cc:   [name] All, M.D. [facility]

## 2011-02-12 NOTE — H&P (Signed)
NAME:  [name], [name]                 ACCOUNT NO.:  [id number]   MEDICAL RECORD NO.:  [id number]          PATIENT TYPE:  INP   LOCATION:  5527                         FACILITY:  [facility]   PHYSICIAN:  Santiago Bumpers. Hensel, M.D.DATE OF BIRTH:  01-Mar-1951   DATE OF ADMISSION:  09/07/2005  DATE OF DISCHARGE:                                HISTORY & PHYSICAL   CHIEF COMPLAINT:  Congestive heart failure, chest pain and syncope.   HISTORY OF PRESENT ILLNESS:  The patient is a 60 year old man with a history  of congestive heart failure, COPD, and multiple other medical problems  outlined below who presents to the [facility] routine follow up.  While he was in  the clinic, he noted he had 45 minutes of sharp chest pain which started in  his right lower chest and moved to the left lower side of his chest.  He  also had associated shortness of breath and nausea, but no vomiting.  While  in clinic, he also had an episode of syncope or near syncope which was not  witnessed and described by the patient as I don't know what happened.  I  got dizzy and fell out of my chair.Marland Kitchen   REVIEW OF SYSTEMS:  As in HPI.  Additionally, no fevers or chills.  No  cough.  No vomiting.  No dysuria.  No rashes or skin lesions.  No focal  weakness.  He does complain of shoulder pain after his fall.   PAST MEDICAL HISTORY:  1.  Congestive heart failure with an ejection fraction of 45%.  2.  COPD.  3.  Hypertension.  4.  Diabetes mellitus type 2.  5.  Obesity.  6.  Sleep apnea.  7.  Hypothyroidism.  8.  Diabetic nephropathy.  9.  Diabetic retinopathy.  10. Lumbar spinal disk disease.   PAST SURGICAL HISTORY:  He apparently has had -  1.  A spinal fusion.  2.  Tonsillectomy and adenoidectomy.   MEDICATIONS:  1.  Aldactone 50 mg p.o. t.i.d.  2.  Aspirin 325 mg p.o. daily.  3.  Crestor 10 mg p.o. daily.  4.  Diovan 320 mg p.o. daily.  5.  Lasix 40 mg p.o. daily.  6.  Levothroid 0.1 mg p.o. daily.  7.  Oxygen 2 liters via  nasal cannula.  8.  Potassium chloride 8 mEq daily.  9.  Sorbitol p.r.n.  10. Insulin 75/25, 100 units q.a.m., 40 units q.p.m.   ALLERGIES:  No known drug allergies.   SOCIAL HISTORY:  He is a disabled Naval architect.  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  He has a sister in [place].  He lives in a mobile home and has  limited financial resources.  He has a remote history of tobacco abuse.   PHYSICAL EXAMINATION:  VITAL SIGNS:  Temperature 97.9, heart rate 107, blood  pressure 187/100, weight 281.9.  GENERAL:  He is an ill-appearing gentleman, but he seems to be alert and  oriented.  HEENT:  He is normocephalic and atraumatic.  Pupils equal, round and  reactive to light.  Extraocular  muscles are intact.  Oropharynx is without  erythema or exudate.  LUNGS:  He has distant breath sounds, but no crackles are appreciated.  Of  note, this is a very difficult exam on this gentleman.  HEART:  He has distant heart sounds, and given this aspect of his exam, I  could not appreciate any murmurs or gallops, but I am unsure if they are  present.  ABDOMEN:  Obese, soft, nontender, nondistended, with normoactive bowel  sounds.  EXTREMITIES:  He has 2 to 3+ pitting edema.  He does seem to have some  difficulty with active range of motion of his shoulders secondary to pain,  but now obvious deformity is noted.  NEUROLOGIC:  Cranial nerves II-XII are grossly intact.  He has generalized  weakness, but he is 4+/5 and equal bilaterally.  His gait was not assessed.   An EKG showed no acute changes, and a CBG showed 203.   ASSESSMENT AND PLAN:  1.  Chest pain that sounds atypical in nature.  Given the syncope, however,      it is prudent to admit him to rule out a life-threatening cause such as      pulmonary embolus or cardiac ischemia.  I am going to admit him to a      monitored bed and check cardiac enzymes.  Because his congestive heart      failure does not appear to have been worked up fully for  ischemic      causes, will consult cardiology for further evaluation.  In a history      and physical dated July 06, 2005, it is reported that he was seen by      cardiology in 2005, although the cardiologist is not listed.  His chest      Cardiolite test showed an ejection fraction of 49% and probable prior      infarction in the inferior area.  I cannot find any report of this      specifically.  2.  Congestive heart failure.  His weight appears to be up 20 pounds from      his hospital discharge in October of 2006.  He is also on chronic      oxygen, and ejection fraction is 45%.  I am going to check a chest x-      ray, and depending on any evidence of pulmonary edema, will start him on      IV Lasix at that time.  3.  Chronic obstructive pulmonary disease.  He is not on any medications      currently.  We are going to continue his oxygen, and consider consulting      Dr. Sherene Sires while he is an inpatient.  4.  Diabetes mellitus.  He was not well controlled.  One month ago, his      hemoglobin A1C was 8.6.  I am going to continue his current regimen and      sliding scale insulin for now.  5.  Obstructive sleep apnea.  I am unsure if he uses CPAP, but I will      discuss this with the patient.  6.  Hypertension.  His blood pressure is quite elevated.  I am going to      continue his home medications for now and      treat with labetalol p.r.n.  7.  Hypothyroidism.  Continue his Levothroid.  8.  Disposition.  I will try to find out who performed his  Cardiolite stress      test.      Ursula Beath, MD    ______________________________  Santiago Bumpers. Leveda Anna, M.D.    JT/MEDQ  D:  09/07/2005  T:  09/08/2005  Job:  [id number]

## 2011-02-12 NOTE — Cardiovascular Report (Signed)
[name], [name] NO.:  [id number]   MEDICAL RECORD NO.:  [id number]          PATIENT TYPE:  INP   LOCATION:  2918                         FACILITY:  [facility]   PHYSICIAN:  Salvadore Farber, MD  DATE OF BIRTH:  26-Dec-1950   DATE OF PROCEDURE:  10/10/2006  DATE OF DISCHARGE:                            CARDIAC CATHETERIZATION   PROCEDURES:  1. Left heart catheterization.  2. Left ventriculography.  3. Coronary angiography.  4. Star Close closure of the right common femoral arteriotomy site.   INDICATIONS:  [name] is a 60 year old gentleman with longstanding  atherosclerotic coronary disease in the setting of severe COPD, morbid  obesity, and diabetes mellitus.  He has previously undergone stenting of  the mid-LAD, cutting balloon angioplasty of the first diagonal, and  stenting of the distal right coronary artery, all in May 2007.  He now  presents with multiple episodes of rest chest discomfort and class IV  angina.  Troponin was elevated to 6.4 with MB of 3.8.  He has been  diuresed with some improvement in his heart failure and then referred  for diagnostic angiography.   PROCEDURAL TECHNIQUE:  Informed consent was obtained.  Underwent 1%  lidocaine local anesthesia, a 5-French sheath was placed in the right  common femoral artery using the modified Seldinger technique.  Diagnostic angiography and ventriculography were performed using JL-4,  JR-4, and pigtail catheters.  The arteriotomy was then closed using a  Star Close device.  Complete hemostasis was obtained.  The patient was  then transferred to the holding room in stable condition, having  tolerated the procedure well.   COMPLICATIONS:  None.   FINDINGS:  1. LV:  140 03/09/2021.  EF 35% with global hypokinesis and apical      dyskinesis.  2. Left main:  Angiographically normal.  3. LAD:  A moderate-sized vessel giving rise to two diagonals.  The      first diagonal is very large and has a  40% restenosis at the prior      site of cutting balloon angioplasty.  The previously-placed drug-      eluting stent in the mid-LAD is widely patent.  There is normal      flow in the small jailed diagonal.  The distal LAD is diffusely and      severely diseased with up to 95% stenosis.  As compared with May      2007, there is been substantial progression of disease in the      apical LAD.  However, at that point the vessel was small and      diffusely diseased with a diameter of well under 2 mm in the      segment in question.  4. Circumflex:  Moderate-sized vessel giving rise to two marginals and      two posterior left ventricular branches.  The AV groove circumflex      distal to the second marginal has a 70% stenosis.  This is      unchanged from prior.  5. RCA:  Moderate-sized, dominant vessel.  There is  a previously-      placed stent in the distal vessel.  There is no in-stent      restenosis.   IMPRESSION/RECOMMENDATIONS:  1. Moderately impaired left ventricular systolic function including      apical dyskinesis.  EF 35%.  2. He has had substantial progression in the atherosclerotic disease      of the distal left anterior descending artery.  However, even prior      to that the vessel was severely and diffusely diseased with being      well under 2 mm in      diameter.  3. Elevated left ventricular end-diastolic pressure.   Will plan medical therapy for his angina and heart failure.      Salvadore Farber, MD  Electronically Signed     WED/MEDQ  D:  10/10/2006  T:  10/11/2006  Job:  [id number]   cc:   [name]  [name], MD, [facility]

## 2011-02-12 NOTE — H&P (Signed)
[name], [name]                 ACCOUNT NO.:  [id number]   MEDICAL RECORD NO.:  [id number]          PATIENT TYPE:  INP   LOCATION:  1830                         FACILITY:  [facility]   PHYSICIAN:  Bevelyn Buckles. Bensimhon, MDDATE OF BIRTH:  12/26/1950   DATE OF ADMISSION:  10/07/2006  DATE OF DISCHARGE:                              HISTORY & PHYSICAL   PRIMARY CARE PHYSICIAN:  Ursula Beath, MD, [name].   CARDIOLOGIST:  [name], MD.   PULMONOLOGIST:  Charlaine Dalton. Wert, MD.   REASON FOR ADMISSION:  Non-ST elevation MI and class IV congestive heart  failure.   HISTORY OF PRESENT ILLNESS:  [name] is a 60 year old male with  multiple medical problems including coronary artery disease status post  non-ST elevation myocardial infarction in May of 2007 with multivessel  stenting, congestive heart failure secondary to ischemic cardiomyopathy  with EF of 35%, COPD on home oxygen, morbid obesity and diabetes.   He was admitted in May of 2007 with non-ST elevation myocardial  infarction.  Cath by Dr. Antoine Poche showed severe disease in his LAD and  diagonal with severe distal disease in the RCA and left circumflex.  Plan was for PCI of the LAD and diagonal.  However, during that  admission, he developed inferior ST elevation and repeat catheterization  showed a 95% distal RCA lesion which was treated with emergent stenting  by Dr. Juanda Chance using a Horizon study stent.  He then was brought back to  the lab and underwent PCA and Taxus drug-eluting stent to the LAD and  cutting balloon to the diagonal.   Since May, he says he has had intermittent chest pain.  However, since  Christmas, the pain is much worse with multiple episodes of rest pain  requiring two to three nitroglycerin.  Over the past week, however, his  predominant symptom has been progressive heart failure with class IV  symptoms to the point where he can barely walk from the bed to the other  side of  his trailer.  This prompted him to come to the ER.  In the ER,  he was in marked respiratory distress.  Chest x-ray showed congestive  heart failure.  EKG showed a left bundle branch block.  This was not  identified immediately by the ER staff.  His last chest pain was two  days ago.  Initial cardiac markers showed a troponin of 6.4 with an MB  of 3.8.  He is beginning to diurese but stills very short of breath.  Currently denies any chest pain.  He tells me he has been compliant with  his Plavix.   REVIEW OF SYSTEMS:  Positive for occasional chills and no fever.  Denies  any nausea and vomiting.  No abdominal pain.  He has had arthritis pain.  He says he has had some dark stools several weeks ago but none recently.  No bright red blood per rectum.  He has had weight gain.  No focal  neurologic symptoms.  The remainder of the review of systems is negative  except for HPI and  problem list.   PROBLEM LIST:  1. Coronary artery disease.      a.     Status post non-ST elevation myocardial infarction in May of       2007 complicated by in-house inferior ST elevation.      b.     Status post percutaneous intervention on the RCA, LAD and       diagonal.  2. Congestive heart failure secondary to ischemic cardiomyopathy, EF      of 35%.  3. Advanced COPD on home O2.  4. Morbid obesity.  5. Diabetes.  6. Hypertension.  7. Sleep apnea.  8. Hyperlipidemia.  9. Anxiety.  10.History of spinal fusion.   CURRENT MEDICATIONS:  1. Sorbitol 1 teaspoon b.i.d.  2. NovoLog insulin 70/30 with 110 in the morning and 50 at night.  3. Aspirin 325 mg daily.  4. Potassium 8 mEq a day.  5. Lasix 80 in the morning and 40 at noon.  6. Diovan 320 a day.  7. Plavix 75 a day.  8. Synthroid 100 mcg a day.  9. Imdur 120 a day.  10.Metoprolol 100 mg in the morning and 50 at night.  11.Spironolactone 50 daily.  12.Lipitor 80 a day.  13.Nitroglycerin p.r.n.  14.Albuterol and Atrovent nebs as needed.    ALLERGIES:  ACTOS and ALTACE, unclear what they are.   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  He  lives alone.  He has a history of tobacco use but quit over 10 years  ago.  No significant alcohol.   FAMILY HISTORY:  Mother is alive, unknown health problems.  The father  is unknown to the patient.  Sister had breast cancer.   PHYSICAL EXAMINATION:  He is a morbidly obese male, significantly short  of breath at rest wearing a face mask.  Blood pressure is 134/88 with a  heart rate of 100.  Temperature is 97.5.  He is saturating 99% on face  mask.  HEENT:  Sclerae anicteric.  EOMI.  There are no xanthelasma.  Mucus  membranes are moist.  Oropharynx is clear.  Poor dentition.  Neck is  supple.  JVP is markedly elevated.  Carotids are 2+ bilaterally.  I am  unable to assess for bruits.  There is no lymphadenopathy or  thyromegaly.  CARDIAC:  He has very distant heart sounds.  He is tachycardic, no  obvious murmurs.  LUNGS:  Have diffuse crackles half way up bilaterally.  No wheezing.  ABDOMEN:  Obese, nontender, nondistended.  No hepatosplenomegaly, no  bruits, no masses.  EXTREMITIES:  Warm with 3+ edema bilaterally.  Faint distal pulses.  No  rashes.  NEUROLOGICAL:  Alert and oriented x3.  Cranial nerves II through XII are  intact.  Moves all four extremities without difficulty.  Affect is  appropriate.   LABS:  Show a white count of 9.5, hemoglobin 11.3, platelets of 233.  Sodium 139, potassium 3.4, chloride of 105, bicarb of 26, glucose of  177, BUN was 14, creatinine was 0.74.  Point of care markers show a CK-  MB of 3.8, troponin of 6.43, BNP is 779.  EKG shows sinus tachycardia at  a rate of 100 and a left bundle branch block.  Chest x-ray shows massive  cardiomegaly with CHF.   ASSESSMENT:  1. Non-ST-segment elevation myocardial infarction.  2. Acute on chronic systolic heart failure class IV. 3. Coronary artery disease, status post percutaneous  coronary  intervention of the right coronary artery, left anterior descending      artery and diagonal in May 2007.  4. Chronic obstructive pulmonary disease, O2 dependent.  5. Morbid obesity.  6. Intermittent left bundle branch block.  7. Diabetes.   PLAN/DISCUSSION:  We will admit [name] to stepdown.  Proceed with IV  diuresis, IV nitroglycerin, Lovenox, aspirin and Plavix as well as his  ARB.  The timed course of his non-STEMI and left bundle branch block are  unclear; however, it does appear that he has had intermittent left  bundle branch block in the past.  As he is not having active chest pain,  he does not appear to need to go to the cath lab urgently.  We will  continue to work on his respiratory status.  He will need probable cath  Monday.  We will hold beta blocker until his respiratory status  improves.  We will start him on digoxin.      Bevelyn Buckles. Bensimhon, MD  Electronically Signed     DRB/MEDQ  D:  10/08/2006  T:  10/08/2006  Job:  [id number]

## 2011-02-12 NOTE — Assessment & Plan Note (Signed)
Graham Graham                             PULMONARY OFFICE NOTE   NAME:Graham Graham                        MRN:          [id number]  DATE:09/30/2006                            DOB:          1951-02-02    HISTORY OF PRESENT ILLNESS:  This is a very complicated 60 year old  white male patient of Dr. Thurston Hole who has a known history of COPD,  coronary artery disease with ischemic cardiomyopathy and congestive  heart failure presents for a 2-week followup.  The patient last visit  was having complaints of increased shortness of breath and lower  extremity swelling.  Last visit the patient was recommended to restart  Aldactone at 50 mg daily and recommended to wear continuous oxygen  therapy at 2 L.  The patient also has been having some difficulty with  medications and had been recommended to bring his medications in today  for review.  The patient is quite confused with some of his medications  such as spironolactone.  The patient had been recommended to start  aldactone last visit; however, had reported that he was unable to get  that prescription filled due to inability to afford his medication.  However, in his prescription bag today he has spironolactone in his  prescription bag that he reports he is unsure if he has been taking or  not.  The patient reports that finances play a big part in his  medications.  They are quite expensive and he lives on a very limited  disability income of $700 to $1100 a month.  The patient also has to  depend on public transportation and is not able to always get to his  medical appointments.  The patient does report he feels somewhat better  with decreased lower extremity swelling and shortness of breath since  wearing his oxygen continuously.   PAST MEDICAL HISTORY:  Reviewed.   CURRENT MEDICATIONS:  Reviewed.   PHYSICAL EXAMINATION:  GENERAL:  The patient is a pleasant male in no  acute distress.  VITAL SIGNS:   He is afebrile with stable vital signs.  O2 saturation is  98% on 2 L.  Weight is down 2 pounds at 296.  HEENT:  Unremarkable.  NECK:  Supple without adenopathy.  No JVD.  LUNG SOUNDS:  Reveal diminished breath sounds at bases, otherwise clear.  CARDIAC:  Regular rate and rhythm.  ABDOMEN:  Morbidly obese with a large panniculus.  EXTREMITIES:  Warm without any calf tenderness, cyanosis or clubbing.  There is 1-2+ edema bilaterally.  The patient also has multiple  excoriations along the upper extremities and lower extremities.   IMPRESSION AND PLAN:  1. History of chronic obstructive pulmonary disease which is oxygen      dependent.  The patient is encouraged to continue on continuous      oxygen daily.  The patient does have albuterol nebulizer to use on      an as-needed basis.  2. History of coronary artery disease, ischemic cardiomyopathy and      congestive heart failure.  The patient has been recommended to  follow back up with Dr. Antoine Poche in the next 1-2 weeks.  I have      offered to make the appointment today.  However, the patient wants      to call back once he can set up transportation to Dr. Jenene Slicker      office.  The patient has been recommended to start spironolactone      as recommended per last visit.  A BMET and BNP are currently      pending today's visit.  To note, the patient is on spironolactone      and furosemide, along with a very low dose potassium pill.  Will      need to watch his potassium levels very carefully.  He may need to,      in the future, discontinue potassium chloride altogether.  Will      follow up on labs accordingly.  3. Complex medication regimen.  The patient's medications reviewed in      detail.  Patient education was provided.  A computerized medication      calendar was completed for this patient and reviewed in detail.      The patient is aware to bring this back to each and every visit.      Rubye Oaks, NP   Electronically Signed      Graham Graham Graham, [name]  Electronically Signed   TP/MedQ  DD: 09/30/2006  DT: 09/30/2006  Job #: [id number]

## 2011-02-17 ENCOUNTER — Ambulatory Visit (INDEPENDENT_AMBULATORY_CARE_PROVIDER_SITE_OTHER): Payer: Medicare Other | Admitting: Family Medicine

## 2011-02-17 ENCOUNTER — Ambulatory Visit (INDEPENDENT_AMBULATORY_CARE_PROVIDER_SITE_OTHER): Payer: Medicare Other | Admitting: *Deleted

## 2011-02-17 ENCOUNTER — Encounter: Payer: Self-pay | Admitting: Family Medicine

## 2011-02-17 DIAGNOSIS — I82409 Acute embolism and thrombosis of unspecified deep veins of unspecified lower extremity: Secondary | ICD-10-CM

## 2011-02-17 DIAGNOSIS — E1165 Type 2 diabetes mellitus with hyperglycemia: Secondary | ICD-10-CM

## 2011-02-17 DIAGNOSIS — R079 Chest pain, unspecified: Secondary | ICD-10-CM

## 2011-02-17 DIAGNOSIS — E118 Type 2 diabetes mellitus with unspecified complications: Secondary | ICD-10-CM

## 2011-02-17 DIAGNOSIS — I5022 Chronic systolic (congestive) heart failure: Secondary | ICD-10-CM

## 2011-02-17 DIAGNOSIS — Z7901 Long term (current) use of anticoagulants: Secondary | ICD-10-CM

## 2011-02-17 DIAGNOSIS — E039 Hypothyroidism, unspecified: Secondary | ICD-10-CM

## 2011-02-17 MED ORDER — INSULIN DETEMIR 100 UNIT/ML ~~LOC~~ SOLN: 50.0000 [IU] | Freq: Every day | SUBCUTANEOUS | Status: DC

## 2011-02-17 NOTE — Assessment & Plan Note (Signed)
Euvolemic.  Will continue torsemide 40mg  daily.  Discussed at length with pt today regarding daily weights and the "dangerous" weight for which pt needs to call [facility].  We decided at he should call [facility] if weight is 145 lbs or above.  If weight is >250 lbs then he needs to call [facility] or go to ER.

## 2011-02-17 NOTE — Progress Notes (Signed)
  Subjective:    Patient ID: [name], male    DOB: 09-22-1951, 60 y.o.   MRN: [id number]  HPI  Diabetes: CBGs: 170s-180s Taking Lantus 45 units each morning. Compliance: yes   Prob taking med: no Lightheadedness: no  Dyspnea: yes with exertion  Abd pain: no  N/V: no   Fever/chills: no  Tobacco use: Smoking about 1 pack per week.  He has been trying to quit smoking.  His friends smoke and he leaves his cigarettes at home so that he does not smoke.  Of[facility] appt with [facility] but he declined.  DVT: Checking INR today.  Last INR 2.0   CHF:  Taking torsemide 40mg  daily. Weight change: +3 lbs.   Dyspnea: yes.  Pt has O2 for home use and he carries it with him, but uses it prn.   Chest pain:  Some CP during last weekend when he was walking. Lasted about 20 minutes before he took NTG, which helped the pain.  Syncope:no   LE edema:yes   Palpitations: no Weighing himself daily: yes Salt restriction: trying to. Using Mrs Sharilyn Sites and pepper and other spices  Pmhx, Pshx, Fhx, Shx reviewed  Review of Systems Per hpi     Objective:   Physical Exam  Constitutional: He is oriented to person, place, and time. He appears well-developed and well-nourished. No distress.  Neck: Normal range of motion. Neck supple. No thyromegaly present.  Cardiovascular: Normal rate, regular rhythm, normal heart sounds and intact distal pulses.   No murmur heard. Pulmonary/Chest: Effort normal and breath sounds normal. No respiratory distress. He has no wheezes.  Abdominal: Soft. Bowel sounds are normal. He exhibits no distension. There is no tenderness.  Musculoskeletal: He exhibits edema. He exhibits no tenderness.       Trace edema.  Neurological: He is alert and oriented to person, place, and time.          Assessment & Plan:

## 2011-02-17 NOTE — Patient Instructions (Signed)
Please make an appointment for f/u in 4 wks.  Change insulin to 50 units.

## 2011-02-17 NOTE — Assessment & Plan Note (Signed)
CBGs in 170s-180s, still not at goal but much improved because his last A1C >9.  Will increase Lantus from 45 units to 50 units.  Pt to rtc in 1 month for f/u with his CBGs log.

## 2011-02-17 NOTE — Assessment & Plan Note (Signed)
Chest pain with exertion sometimes.  Relieved with NTG.  Pt has Imdur on board also.  Pt with history of Coronary artery disease. History of PCI to the RCA in 2007.  NSTEMI status post PCI to the LAD and diagonal in 2002.  Cath from 2009 showed 1) Severe distal CAD c/w diabetic vasculopathy. The distal LAD and AV-groove LCX are subtotally occluded.  2) Moderate disease in prox RCA  3) The ostium of the OM has a tight lesion which may be amenable to PCI 4) Severe LV dysfunction EF 20-25%.  Pt would likely benefit from appt to see cardiology again. He was seen by Dr Elease Hashimoto in 09/2010 when he was admitted to hospital.  Pt states that he was told to f/u in 6 months.  I will put in a referral.

## 2011-02-17 NOTE — Assessment & Plan Note (Signed)
TSH 2.9 in 08/2010 and pt asymptomatic.  Will check TSH at next visit.

## 2011-02-17 NOTE — Assessment & Plan Note (Signed)
INR 1.8.  Since d/c in 09/2010 pt has had about 3 INRs that have been therapeutic. Will increase coumadin dose today 7.5mg  on 2 days and 5mg  on the rest of the days.

## 2011-02-20 ENCOUNTER — Emergency Department (HOSPITAL_COMMUNITY): Payer: Medicare Other

## 2011-02-20 ENCOUNTER — Inpatient Hospital Stay (HOSPITAL_COMMUNITY)
Admission: EM | Admit: 2011-02-20 | Discharge: 2011-03-12 | DRG: 286 | Disposition: A | Discharge status: Other Acute Inpatient Hospital | Payer: Medicare Other | Attending: Cardiology | Admitting: Cardiology

## 2011-02-20 DIAGNOSIS — Z794 Long term (current) use of insulin: Secondary | ICD-10-CM

## 2011-02-20 DIAGNOSIS — E1165 Type 2 diabetes mellitus with hyperglycemia: Secondary | ICD-10-CM | POA: Diagnosis present

## 2011-02-20 DIAGNOSIS — E669 Obesity, unspecified: Secondary | ICD-10-CM | POA: Diagnosis present

## 2011-02-20 DIAGNOSIS — Z79899 Other long term (current) drug therapy: Secondary | ICD-10-CM

## 2011-02-20 DIAGNOSIS — I2510 Atherosclerotic heart disease of native coronary artery without angina pectoris: Principal | ICD-10-CM | POA: Diagnosis present

## 2011-02-20 DIAGNOSIS — J984 Other disorders of lung: Secondary | ICD-10-CM | POA: Diagnosis present

## 2011-02-20 DIAGNOSIS — I252 Old myocardial infarction: Secondary | ICD-10-CM

## 2011-02-20 DIAGNOSIS — R079 Chest pain, unspecified: Secondary | ICD-10-CM

## 2011-02-20 DIAGNOSIS — I447 Left bundle-branch block, unspecified: Secondary | ICD-10-CM | POA: Diagnosis present

## 2011-02-20 DIAGNOSIS — Z7982 Long term (current) use of aspirin: Secondary | ICD-10-CM

## 2011-02-20 DIAGNOSIS — I2789 Other specified pulmonary heart diseases: Secondary | ICD-10-CM | POA: Diagnosis present

## 2011-02-20 DIAGNOSIS — M1990 Unspecified osteoarthritis, unspecified site: Secondary | ICD-10-CM | POA: Diagnosis present

## 2011-02-20 DIAGNOSIS — D649 Anemia, unspecified: Secondary | ICD-10-CM | POA: Diagnosis present

## 2011-02-20 DIAGNOSIS — F17200 Nicotine dependence, unspecified, uncomplicated: Secondary | ICD-10-CM | POA: Diagnosis present

## 2011-02-20 DIAGNOSIS — I509 Heart failure, unspecified: Secondary | ICD-10-CM | POA: Diagnosis present

## 2011-02-20 DIAGNOSIS — F341 Dysthymic disorder: Secondary | ICD-10-CM | POA: Diagnosis present

## 2011-02-20 DIAGNOSIS — E11319 Type 2 diabetes mellitus with unspecified diabetic retinopathy without macular edema: Secondary | ICD-10-CM | POA: Diagnosis present

## 2011-02-20 DIAGNOSIS — E785 Hyperlipidemia, unspecified: Secondary | ICD-10-CM | POA: Diagnosis present

## 2011-02-20 DIAGNOSIS — J449 Chronic obstructive pulmonary disease, unspecified: Secondary | ICD-10-CM | POA: Diagnosis present

## 2011-02-20 DIAGNOSIS — I5023 Acute on chronic systolic (congestive) heart failure: Secondary | ICD-10-CM | POA: Diagnosis present

## 2011-02-20 DIAGNOSIS — E1142 Type 2 diabetes mellitus with diabetic polyneuropathy: Secondary | ICD-10-CM | POA: Diagnosis present

## 2011-02-20 DIAGNOSIS — E1149 Type 2 diabetes mellitus with other diabetic neurological complication: Secondary | ICD-10-CM | POA: Diagnosis present

## 2011-02-20 DIAGNOSIS — I129 Hypertensive chronic kidney disease with stage 1 through stage 4 chronic kidney disease, or unspecified chronic kidney disease: Secondary | ICD-10-CM | POA: Diagnosis present

## 2011-02-20 DIAGNOSIS — I2589 Other forms of chronic ischemic heart disease: Secondary | ICD-10-CM | POA: Diagnosis present

## 2011-02-20 DIAGNOSIS — J9610 Chronic respiratory failure, unspecified whether with hypoxia or hypercapnia: Secondary | ICD-10-CM | POA: Diagnosis present

## 2011-02-20 DIAGNOSIS — E1139 Type 2 diabetes mellitus with other diabetic ophthalmic complication: Secondary | ICD-10-CM | POA: Diagnosis present

## 2011-02-20 DIAGNOSIS — N183 Chronic kidney disease, stage 3 unspecified: Secondary | ICD-10-CM | POA: Diagnosis present

## 2011-02-20 DIAGNOSIS — I200 Unstable angina: Secondary | ICD-10-CM | POA: Diagnosis present

## 2011-02-20 DIAGNOSIS — Z7901 Long term (current) use of anticoagulants: Secondary | ICD-10-CM

## 2011-02-20 DIAGNOSIS — E875 Hyperkalemia: Secondary | ICD-10-CM | POA: Diagnosis not present

## 2011-02-20 DIAGNOSIS — Z981 Arthrodesis status: Secondary | ICD-10-CM

## 2011-02-20 DIAGNOSIS — Z86718 Personal history of other venous thrombosis and embolism: Secondary | ICD-10-CM

## 2011-02-20 DIAGNOSIS — J4489 Other specified chronic obstructive pulmonary disease: Secondary | ICD-10-CM | POA: Diagnosis present

## 2011-02-20 DIAGNOSIS — G4733 Obstructive sleep apnea (adult) (pediatric): Secondary | ICD-10-CM | POA: Diagnosis present

## 2011-02-20 DIAGNOSIS — E039 Hypothyroidism, unspecified: Secondary | ICD-10-CM | POA: Diagnosis present

## 2011-02-20 LAB — DIFFERENTIAL
Basophils Absolute: 0 10*3/uL (ref 0.0–0.1)
Basophils Relative: 0 % (ref 0–1)
Monocytes Relative: 7 % (ref 3–12)
Neutro Abs: 8.4 10*3/uL — ABNORMAL HIGH (ref 1.7–7.7)
Neutrophils Relative %: 82 % — ABNORMAL HIGH (ref 43–77)

## 2011-02-20 LAB — URINALYSIS, ROUTINE W REFLEX MICROSCOPIC
Glucose, UA: NEGATIVE mg/dL
Nitrite: NEGATIVE
Protein, ur: 100 mg/dL — AB
Urobilinogen, UA: 0.2 mg/dL (ref 0.0–1.0)

## 2011-02-20 LAB — CK TOTAL AND CKMB (NOT AT ARMC)
CK, MB: 5.5 ng/mL — ABNORMAL HIGH (ref 0.3–4.0)
Total CK: 200 U/L (ref 7–232)

## 2011-02-20 LAB — GLUCOSE, CAPILLARY: Glucose-Capillary: 230 mg/dL — ABNORMAL HIGH (ref 70–99)

## 2011-02-20 LAB — HEMOGLOBIN A1C
Hgb A1c MFr Bld: 9.4 % — ABNORMAL HIGH (ref <5.7)
Mean Plasma Glucose: 223 mg/dL — ABNORMAL HIGH (ref <117)

## 2011-02-20 LAB — CBC
Hemoglobin: 10.9 g/dL — ABNORMAL LOW (ref 13.0–17.0)
RBC: 3.68 MIL/uL — ABNORMAL LOW (ref 4.22–5.81)

## 2011-02-20 LAB — BASIC METABOLIC PANEL
Chloride: 106 mEq/L (ref 96–112)
Creatinine, Ser: 1.25 mg/dL (ref 0.4–1.5)
GFR calc Af Amer: >60 mL/min (ref >60)

## 2011-02-20 LAB — PRO B NATRIURETIC PEPTIDE: Pro B Natriuretic peptide (BNP): 9177 pg/mL — ABNORMAL HIGH (ref 0–125)

## 2011-02-21 DIAGNOSIS — I5023 Acute on chronic systolic (congestive) heart failure: Secondary | ICD-10-CM

## 2011-02-21 LAB — GLUCOSE, CAPILLARY
Glucose-Capillary: 214 mg/dL — ABNORMAL HIGH (ref 70–99)
Glucose-Capillary: 145 mg/dL — ABNORMAL HIGH (ref 70–99)

## 2011-02-21 LAB — DIGOXIN LEVEL: Digoxin Level: 1.1 ng/mL (ref 0.8–2.0)

## 2011-02-22 DIAGNOSIS — I2510 Atherosclerotic heart disease of native coronary artery without angina pectoris: Secondary | ICD-10-CM

## 2011-02-22 DIAGNOSIS — I5021 Acute systolic (congestive) heart failure: Secondary | ICD-10-CM

## 2011-02-22 LAB — BASIC METABOLIC PANEL
Calcium: 8.5 mg/dL (ref 8.4–10.5)
GFR calc Af Amer: 53 mL/min — ABNORMAL LOW (ref >60)
GFR calc non Af Amer: 44 mL/min — ABNORMAL LOW (ref >60)
Glucose, Bld: 153 mg/dL — ABNORMAL HIGH (ref 70–99)
Potassium: 4.1 mEq/L (ref 3.5–5.1)
Sodium: 141 mEq/L (ref 135–145)

## 2011-02-22 LAB — GLUCOSE, CAPILLARY
Glucose-Capillary: 119 mg/dL — ABNORMAL HIGH (ref 70–99)
Glucose-Capillary: 181 mg/dL — ABNORMAL HIGH (ref 70–99)
Glucose-Capillary: 141 mg/dL — ABNORMAL HIGH (ref 70–99)
Glucose-Capillary: 97 mg/dL (ref 70–99)

## 2011-02-23 ENCOUNTER — Ambulatory Visit: Payer: Medicare Other | Admitting: Cardiology

## 2011-02-23 DIAGNOSIS — I2510 Atherosclerotic heart disease of native coronary artery without angina pectoris: Secondary | ICD-10-CM

## 2011-02-23 DIAGNOSIS — Z01810 Encounter for preprocedural cardiovascular examination: Secondary | ICD-10-CM

## 2011-02-23 LAB — BASIC METABOLIC PANEL
BUN: 43 mg/dL — ABNORMAL HIGH (ref 6–23)
CO2: 26 mEq/L (ref 19–32)
Calcium: 8.5 mg/dL (ref 8.4–10.5)
Glucose, Bld: 127 mg/dL — ABNORMAL HIGH (ref 70–99)
Sodium: 142 mEq/L (ref 135–145)

## 2011-02-23 LAB — GLUCOSE, CAPILLARY
Glucose-Capillary: 80 mg/dL (ref 70–99)
Glucose-Capillary: 85 mg/dL (ref 70–99)

## 2011-02-23 LAB — CBC
HCT: 27.3 % — ABNORMAL LOW (ref 39.0–52.0)
Hemoglobin: 8.9 g/dL — ABNORMAL LOW (ref 13.0–17.0)
MCHC: 32.6 g/dL (ref 30.0–36.0)
MCV: 87.8 fL (ref 78.0–100.0)

## 2011-02-23 LAB — POCT I-STAT 3, ART BLOOD GAS (G3+)
Acid-base deficit: 2 mmol/L (ref 0.0–2.0)
Bicarbonate: 23.3 mEq/L (ref 20.0–24.0)
TCO2: 24 mmol/L (ref 0–100)
pH, Arterial: 7.366 (ref 7.350–7.450)

## 2011-02-23 LAB — POCT I-STAT 3, VENOUS BLOOD GAS (G3P V)
Bicarbonate: 25.2 mEq/L — ABNORMAL HIGH (ref 20.0–24.0)
O2 Saturation: 55 %
pO2, Ven: 30 mmHg (ref 30.0–45.0)

## 2011-02-24 DIAGNOSIS — I059 Rheumatic mitral valve disease, unspecified: Secondary | ICD-10-CM

## 2011-02-24 DIAGNOSIS — I2510 Atherosclerotic heart disease of native coronary artery without angina pectoris: Secondary | ICD-10-CM

## 2011-02-24 LAB — CBC
Hemoglobin: 9.1 g/dL — ABNORMAL LOW (ref 13.0–17.0)
MCH: 28.7 pg (ref 26.0–34.0)
MCHC: 33.1 g/dL (ref 30.0–36.0)
MCV: 86.8 fL (ref 78.0–100.0)

## 2011-02-24 LAB — BASIC METABOLIC PANEL
CO2: 26 mEq/L (ref 19–32)
Calcium: 8.6 mg/dL (ref 8.4–10.5)
Glucose, Bld: 116 mg/dL — ABNORMAL HIGH (ref 70–99)
Sodium: 139 mEq/L (ref 135–145)

## 2011-02-24 LAB — HEPARIN LEVEL (UNFRACTIONATED): Heparin Unfractionated: 0.26 IU/mL — ABNORMAL LOW (ref 0.30–0.70)

## 2011-02-24 LAB — GLUCOSE, CAPILLARY: Glucose-Capillary: 91 mg/dL (ref 70–99)

## 2011-02-24 NOTE — Consult Note (Signed)
NAMEJULIAS, MOULD                 ACCOUNT NO.:  [id number]  MEDICAL RECORD NO.:  [id number]           PATIENT TYPE:  I  LOCATION:  2927                         FACILITY:  [facility]  PHYSICIAN:  Juan Pick. [name], MD, FACCDATE OF BIRTH:  [birth year]  DATE OF CONSULTATION:  02/20/2011 DATE OF DISCHARGE:                                CONSULTATION   [name] is a 60 year old, patient Dr. Antoine Graham, seen in the ER for chest pain.  The patient has known ischemic cardiomyopathy.  His last cath was in August 2009.  At that time, he had subtotally occluded mid circ with a high-grade proximal OM lesion, moderate RCA disease.  He has known ischemic cardiomyopathy with an EF 20-25%.  Dr. Clifton Graham and Dr. Antoine Graham, decided to medical therapy was warranted.  The patient has had multiple admissions and visits to his primary MD for chest pain and a somewhat more recent visit in the hospital for congestive heart failure.  He indicates being compliant with his medications.  He has had increasing chest pain with some relief with nitro over the last week. He saw his primary MD on the 23rd and her note indicated a referral to Dr. Antoine Graham to further assess.  The patient currently is almost pain free in the ER.  He has a headache in the IV nitroglycerin.  He also has been having increasing PND and orthopnea.  He has chronic +2 lower extremity edema, but does appear volume overloaded as well.  He admits to some salt indiscretion and his blood sugars not appear to be ideally controlled.  His 10-point review of systems is otherwise negative.  His past medical history is remarkable for coronary artery disease with ischemic cardiomyopathy, EF 25%.  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  He also had a previous spinal fusion and tonsillectomy.  His medications as listed in the epic include; 1. Albuterol. 2. Aspirin. 3. Lipitor 80 a day. 4. Coreg 12.5 b.i.d. 5. Digoxin 0.25 a day. 6. Colace. 7. Cardura 1 mg a day. 8. Levemir 50 units subcu daily. 9. Imdur 60 mg a day. 10.Synthroid 100 mcg a day. 11.Flomax 0.4 mg, 2 tablets 0.8 mg daily. 12.Demadex 40 mg a day. 13.Diovan 160 a day. 14.Warfarin as indicated. 15.He gets a cough with ACE inhibitors and gets edema with     Pioglitazone.  The patient is unemployed, on disability.  He lives at home by myself. He does not indicate any family members, he is sedentary and has depression and anxiety.  He currently does not smoke and denies regular alcohol use.  Family history is noncontributory.  He does have premature coronary disease in his father's side.  Exam is remarkable for chronically ill-appearing male with mild chest pain on  IV nitroglycerin, sats 90% on 2 liters.  Blood pressure is 128/70, pulse 80 and regular, respiratory rate 14, afebrile.  HEENT is unremarkable.  Carotids normal without bruit.  No lymphadenopathy, thyromegaly, JVP is mildly elevated.  There is inspiratory crackles at the base.  S1 and S2 with soft systolic murmur.  PMI enlarged.  Abdomen is protuberant.  Bowel sounds positive.  Positive hepatojugular reflux and hepatosplenomegaly, +2 lower extremity edema bilaterally with peripheral neuropathy, +1 PTs bilaterally.  Initial lab work is remarkable for CPK of 200 with an MB of 5.5 and relative index of 2.8.  ProBNP elevated at 9177, troponin 0.35, hematocrit stable at 32, potassium 4.2, creatinine 1.25, and BUN 34. Chest x-ray with cardiomegaly and mild edema.  EKG shows sinus rhythm with IVCD,  no acute changes.  IMPRESSION: 1. Chest pain, recurrent, some relief with nitro, known residual     distal diabetic disease and an ostial OM disease by cath in 2009.     The patient will be admitted to the hospital.  We will not start     him on heparin since he is on Coumadin.  He will continue to be     ruled out.  We will continue his nitrates and beta-blocker.  I     suspect that he will need a right and left heart catheterization on     Tuesday. 2. Mild congestive heart failure in the setting of ischemic     cardiomyopathy, change Demadex to IV, low-sodium diet.  Continue to     monitor I's and O's and weights followup chest x-ray in 2-3 days. 3. Diabetes.  Cover with sliding scale and continue Levemir,     hemoglobin A1c to be checked. 4. Recent prostatism.  I am somewhat surprised that he is on some of     the vasodilators, I will write him for these in addition to his     nitrates, his blood pressure is fine at this time, the patient had     recent TURP in February 2011 by Dr. Vernie Graham.  Care would need to be     taken if a Foley catheter was placed.     Juan Pick. [name], MD, [facility]     PCN/MEDQ  D:  02/20/2011  T:  02/21/2011  Job:  [id number]  Electronically Signed by Juan Haws MD [facility] on 02/24/2011 08:35:07 PM

## 2011-02-25 ENCOUNTER — Inpatient Hospital Stay (HOSPITAL_COMMUNITY): Payer: Medicare Other

## 2011-02-25 LAB — CBC
HCT: 27.6 % — ABNORMAL LOW (ref 39.0–52.0)
Hemoglobin: 9.3 g/dL — ABNORMAL LOW (ref 13.0–17.0)
RBC: 3.19 MIL/uL — ABNORMAL LOW (ref 4.22–5.81)
WBC: 9 10*3/uL (ref 4.0–10.5)

## 2011-02-25 LAB — GLUCOSE, CAPILLARY
Glucose-Capillary: 103 mg/dL — ABNORMAL HIGH (ref 70–99)
Glucose-Capillary: 88 mg/dL (ref 70–99)
Glucose-Capillary: 95 mg/dL (ref 70–99)
Glucose-Capillary: 210 mg/dL — ABNORMAL HIGH (ref 70–99)

## 2011-02-25 LAB — BASIC METABOLIC PANEL
CO2: 27 mEq/L (ref 19–32)
Chloride: 108 mEq/L (ref 96–112)
GFR calc Af Amer: >60 mL/min (ref >60)
Potassium: 3.6 mEq/L (ref 3.5–5.1)
Sodium: 143 mEq/L (ref 135–145)

## 2011-02-25 LAB — PULMONARY FUNCTION TEST

## 2011-02-25 LAB — HEPARIN LEVEL (UNFRACTIONATED): Heparin Unfractionated: 0.55 IU/mL (ref 0.30–0.70)

## 2011-02-26 LAB — CBC
MCHC: 33.2 g/dL (ref 30.0–36.0)
Platelets: 219 10*3/uL (ref 150–400)
RDW: 14 % (ref 11.5–15.5)
WBC: 8.9 10*3/uL (ref 4.0–10.5)

## 2011-02-26 LAB — GLUCOSE, CAPILLARY: Glucose-Capillary: 138 mg/dL — ABNORMAL HIGH (ref 70–99)

## 2011-02-27 LAB — CBC
MCH: 28.8 pg (ref 26.0–34.0)
MCHC: 33.1 g/dL (ref 30.0–36.0)
Platelets: 231 10*3/uL (ref 150–400)

## 2011-02-27 LAB — HEPARIN LEVEL (UNFRACTIONATED): Heparin Unfractionated: 0.3 IU/mL (ref 0.30–0.70)

## 2011-02-27 LAB — GLUCOSE, CAPILLARY: Glucose-Capillary: 114 mg/dL — ABNORMAL HIGH (ref 70–99)

## 2011-02-27 LAB — BASIC METABOLIC PANEL
CO2: 27 mEq/L (ref 19–32)
Calcium: 8.7 mg/dL (ref 8.4–10.5)
Creatinine, Ser: 1.19 mg/dL (ref 0.4–1.5)
Glucose, Bld: 96 mg/dL (ref 70–99)
Sodium: 143 mEq/L (ref 135–145)

## 2011-02-28 LAB — PROTIME-INR
INR: 1.09 (ref 0.00–1.49)
Prothrombin Time: 14.3 seconds (ref 11.6–15.2)

## 2011-02-28 LAB — CBC
HCT: 29 % — ABNORMAL LOW (ref 39.0–52.0)
RDW: 14.2 % (ref 11.5–15.5)
WBC: 7.9 10*3/uL (ref 4.0–10.5)

## 2011-02-28 LAB — GLUCOSE, CAPILLARY
Glucose-Capillary: 114 mg/dL — ABNORMAL HIGH (ref 70–99)
Glucose-Capillary: 100 mg/dL — ABNORMAL HIGH (ref 70–99)

## 2011-02-28 LAB — BASIC METABOLIC PANEL
BUN: 32 mg/dL — ABNORMAL HIGH (ref 6–23)
Calcium: 8.7 mg/dL (ref 8.4–10.5)
Creatinine, Ser: 1.54 mg/dL — ABNORMAL HIGH (ref 0.4–1.5)
GFR calc non Af Amer: 46 mL/min — ABNORMAL LOW (ref >60)
Potassium: 4.8 mEq/L (ref 3.5–5.1)

## 2011-02-28 LAB — HEPARIN LEVEL (UNFRACTIONATED): Heparin Unfractionated: 0.51 IU/mL (ref 0.30–0.70)

## 2011-03-01 DIAGNOSIS — I2510 Atherosclerotic heart disease of native coronary artery without angina pectoris: Secondary | ICD-10-CM

## 2011-03-01 DIAGNOSIS — Z01810 Encounter for preprocedural cardiovascular examination: Secondary | ICD-10-CM

## 2011-03-01 LAB — BASIC METABOLIC PANEL
CO2: 27 mEq/L (ref 19–32)
Chloride: 106 mEq/L (ref 96–112)
GFR calc Af Amer: >60 mL/min (ref >60)
Potassium: 4.9 mEq/L (ref 3.5–5.1)
Sodium: 140 mEq/L (ref 135–145)

## 2011-03-01 LAB — BLOOD GAS, ARTERIAL
Bicarbonate: 23.1 mEq/L (ref 20.0–24.0)
Drawn by: 31996
FIO2: 0.21 %
pH, Arterial: 7.426 (ref 7.350–7.450)
pO2, Arterial: 58.4 mmHg — ABNORMAL LOW (ref 80.0–100.0)

## 2011-03-01 LAB — GLUCOSE, CAPILLARY
Glucose-Capillary: 117 mg/dL — ABNORMAL HIGH (ref 70–99)
Glucose-Capillary: 114 mg/dL — ABNORMAL HIGH (ref 70–99)

## 2011-03-01 LAB — PROTIME-INR: INR: 1.34 (ref 0.00–1.49)

## 2011-03-01 LAB — CBC
HCT: 28.6 % — ABNORMAL LOW (ref 39.0–52.0)
Hemoglobin: 9.4 g/dL — ABNORMAL LOW (ref 13.0–17.0)
MCH: 28.8 pg (ref 26.0–34.0)
MCV: 87.7 fL (ref 78.0–100.0)
RBC: 3.26 MIL/uL — ABNORMAL LOW (ref 4.22–5.81)

## 2011-03-02 ENCOUNTER — Encounter: Payer: Self-pay | Admitting: Cardiology

## 2011-03-02 DIAGNOSIS — J449 Chronic obstructive pulmonary disease, unspecified: Secondary | ICD-10-CM

## 2011-03-02 DIAGNOSIS — I2510 Atherosclerotic heart disease of native coronary artery without angina pectoris: Secondary | ICD-10-CM

## 2011-03-02 LAB — GLUCOSE, CAPILLARY
Glucose-Capillary: 107 mg/dL — ABNORMAL HIGH (ref 70–99)
Glucose-Capillary: 91 mg/dL (ref 70–99)

## 2011-03-02 LAB — PROTIME-INR
INR: 1.53 — ABNORMAL HIGH (ref 0.00–1.49)
Prothrombin Time: 18.6 seconds — ABNORMAL HIGH (ref 11.6–15.2)

## 2011-03-02 LAB — CBC
HCT: 29.1 % — ABNORMAL LOW (ref 39.0–52.0)
Hemoglobin: 9.6 g/dL — ABNORMAL LOW (ref 13.0–17.0)
MCHC: 33 g/dL (ref 30.0–36.0)
MCV: 87.9 fL (ref 78.0–100.0)
RDW: 14.6 % (ref 11.5–15.5)

## 2011-03-03 LAB — CARDIAC PANEL(CRET KIN+CKTOT+MB+TROPI)
CK, MB: 5.7 ng/mL — ABNORMAL HIGH (ref 0.3–4.0)
Relative Index: 4.5 — ABNORMAL HIGH (ref 0.0–2.5)
Total CK: 127 U/L (ref 7–232)

## 2011-03-03 LAB — CBC
Platelets: 202 10*3/uL (ref 150–400)
RBC: 3.12 MIL/uL — ABNORMAL LOW (ref 4.22–5.81)
RDW: 14.6 % (ref 11.5–15.5)
WBC: 9.1 10*3/uL (ref 4.0–10.5)

## 2011-03-03 LAB — PROTIME-INR
INR: 1.88 — ABNORMAL HIGH (ref 0.00–1.49)
Prothrombin Time: 21.8 seconds — ABNORMAL HIGH (ref 11.6–15.2)

## 2011-03-03 LAB — GLUCOSE, CAPILLARY
Glucose-Capillary: 62 mg/dL — ABNORMAL LOW (ref 70–99)
Glucose-Capillary: 87 mg/dL (ref 70–99)

## 2011-03-03 LAB — HEPARIN LEVEL (UNFRACTIONATED): Heparin Unfractionated: 0.46 IU/mL (ref 0.30–0.70)

## 2011-03-03 NOTE — Cardiovascular Report (Signed)
NAMEAADARSH, COZORT NO.:  [id number]  MEDICAL RECORD NO.:  [id number]           PATIENT TYPE:  LOCATION:                                 FACILITY:  PHYSICIAN:  Peter M. Swaziland, M.D.  DATE OF BIRTH:  Mar 08, 1951  DATE OF PROCEDURE:  02/23/2011 DATE OF DISCHARGE:                           CARDIAC CATHETERIZATION   INDICATIONS FOR PROCEDURE:  A 60 year old white male with history of poorly controlled diabetes and coronary artery disease with an ischemic cardiomyopathy who presents with refractory chest pain.  PROCEDURES:  Right and left heart catheterization, coronary and left ventricular angiography.  ACCESS:  Via the right femoral artery and vein using standard Seldinger technique.  EQUIPMENT:  A 5-French 4-cm right and left Judkins catheter, 5-French pigtail catheter, 5-French arterial sheath, 7-French venous sheath, 7- French balloon-tipped Swan-Ganz catheter.  CONTRAST:  Omnipaque 80 mL.  MEDICATIONS:  Local anesthesia 1% Xylocaine, Versed 1 mg IV.  HEMODYNAMIC DATA:  Right atrial pressures 21/20 with a mean of 17 mmHg. Right ventricular pressure 73 with EDP of 20 mmHg.  Pulmonary artery pressures 71/34 with a mean of 47 mmHg.  Pulmonary capillary wedge pressures 36/39 with a mean of 33 mmHg.  Left ventricular pressures 129 with EDP of 35 mmHg.  Aortic pressure was 121 with EDP of 68 mmHg.  By Hiram Comber, cardiac output 6.25 L per minute with an index of 2.82. Thermodilution cardiac outputs were not performed.  ANGIOGRAPHIC DATA: 1. Left ventricular angiography was performed in the RAO view.  This     demonstrates marked left ventricular enlargement with severe global     hypokinesis as well as anterolateral and apical akinesis.  Ejection     fraction was estimated at 20% to 25%. 2. The left main coronary demonstrates tapering stenosis of 30%     distally. 3. The left anterior descending artery has an 80% ostial stenosis.     The proximal vessel was  diffusely diseased to 40%.  There is a     focal stent noted in the mid vessel which is still patent.  The     entire mid LAD is diffusely diseased up to 80%.  The LAD at the     apex is severely diffusely diseased up to 90% to 95%. 4. The left circumflex coronary has a 60% to 70% ostial lesion.  There     is 40% narrowing in the midvessel.  After the takeoff of the second     obtuse marginal vessel, the left circumflex coronary artery has a     90% to 95% stenosis.  The first obtuse marginal vessel also has a     90% to 95% stenosis. 5. The right coronary artery arises and distributes normally.  It has     a long 60% to 70% stenosis in the proximal vessel.  A stent placed     in the distal vessel is still widely patent, but following the     stent the posterolateral branch is severely diffusely diseased up     to 90% to 95%.  FINAL INTERPRETATION: 1. Severe three-vessel obstructive atherosclerotic  coronary artery     disease. 2. Severe left ventricular dysfunction. 3. Severe pulmonary hypertension.  PLAN:  We will discuss his management with Dr. Antoine Poche.  He does not appear to be a good candidate for percutaneous intervention.          ______________________________ Peter M. [place]iland, M.D.     PMJ/MEDQ  D:  02/23/2011  T:  02/24/2011  Job:  [id number]  cc:   [name], MD, [facility]  Electronically Signed by PETER Swaziland M.D. on 03/03/2011 04:54:09 PM

## 2011-03-04 ENCOUNTER — Ambulatory Visit: Payer: Medicare Other

## 2011-03-04 DIAGNOSIS — I200 Unstable angina: Secondary | ICD-10-CM

## 2011-03-04 LAB — CARDIAC PANEL(CRET KIN+CKTOT+MB+TROPI)
Relative Index: 4.6 — ABNORMAL HIGH (ref 0.0–2.5)
Total CK: 113 U/L (ref 7–232)
Troponin I: <0.3 ng/mL (ref <0.30)

## 2011-03-04 LAB — CBC
HCT: 29.2 % — ABNORMAL LOW (ref 39.0–52.0)
Hemoglobin: 9.6 g/dL — ABNORMAL LOW (ref 13.0–17.0)
MCH: 29.3 pg (ref 26.0–34.0)
MCHC: 32.9 g/dL (ref 30.0–36.0)
MCV: 89 fL (ref 78.0–100.0)

## 2011-03-04 LAB — HEPARIN LEVEL (UNFRACTIONATED): Heparin Unfractionated: 0.42 IU/mL (ref 0.30–0.70)

## 2011-03-04 LAB — BASIC METABOLIC PANEL
Calcium: 9.3 mg/dL (ref 8.4–10.5)
GFR calc non Af Amer: 41 mL/min — ABNORMAL LOW (ref >60)
Glucose, Bld: 56 mg/dL — ABNORMAL LOW (ref 70–99)
Sodium: 139 mEq/L (ref 135–145)

## 2011-03-04 LAB — GLUCOSE, CAPILLARY
Glucose-Capillary: 70 mg/dL (ref 70–99)
Glucose-Capillary: 70 mg/dL (ref 70–99)
Glucose-Capillary: 75 mg/dL (ref 70–99)

## 2011-03-04 LAB — PROTIME-INR
INR: 1.83 — ABNORMAL HIGH (ref 0.00–1.49)
Prothrombin Time: 21.3 seconds — ABNORMAL HIGH (ref 11.6–15.2)

## 2011-03-05 LAB — BASIC METABOLIC PANEL
BUN: 33 mg/dL — ABNORMAL HIGH (ref 6–23)
CO2: 24 mEq/L (ref 19–32)
Calcium: 9.1 mg/dL (ref 8.4–10.5)
Calcium: 9 mg/dL (ref 8.4–10.5)
Chloride: 108 mEq/L (ref 96–112)
Creatinine, Ser: 1.71 mg/dL — ABNORMAL HIGH (ref 0.4–1.5)
GFR calc non Af Amer: 41 mL/min — ABNORMAL LOW (ref >60)
Sodium: 139 mEq/L (ref 135–145)

## 2011-03-05 LAB — CBC
MCH: 29.2 pg (ref 26.0–34.0)
MCV: 88.9 fL (ref 78.0–100.0)
Platelets: 220 10*3/uL (ref 150–400)
RDW: 14.7 % (ref 11.5–15.5)

## 2011-03-05 LAB — HEPARIN LEVEL (UNFRACTIONATED): Heparin Unfractionated: 0.66 IU/mL (ref 0.30–0.70)

## 2011-03-05 LAB — GLUCOSE, CAPILLARY: Glucose-Capillary: 161 mg/dL — ABNORMAL HIGH (ref 70–99)

## 2011-03-06 LAB — BASIC METABOLIC PANEL
BUN: 29 mg/dL — ABNORMAL HIGH (ref 6–23)
CO2: 24 mEq/L (ref 19–32)
CO2: 22 mEq/L (ref 19–32)
CO2: 23 mEq/L (ref 19–32)
Calcium: 8.6 mg/dL (ref 8.4–10.5)
Calcium: 8.9 mg/dL (ref 8.4–10.5)
Chloride: 106 mEq/L (ref 96–112)
Chloride: 106 mEq/L (ref 96–112)
Creatinine, Ser: 1.71 mg/dL — ABNORMAL HIGH (ref 0.4–1.5)
Creatinine, Ser: 1.8 mg/dL — ABNORMAL HIGH (ref 0.4–1.5)
GFR calc Af Amer: 47 mL/min — ABNORMAL LOW (ref >60)
GFR calc non Af Amer: 39 mL/min — ABNORMAL LOW (ref >60)
Glucose, Bld: 151 mg/dL — ABNORMAL HIGH (ref 70–99)
Glucose, Bld: 143 mg/dL — ABNORMAL HIGH (ref 70–99)
Sodium: 138 mEq/L (ref 135–145)
Sodium: 137 mEq/L (ref 135–145)

## 2011-03-06 LAB — GLUCOSE, CAPILLARY
Glucose-Capillary: 107 mg/dL — ABNORMAL HIGH (ref 70–99)
Glucose-Capillary: 143 mg/dL — ABNORMAL HIGH (ref 70–99)
Glucose-Capillary: 145 mg/dL — ABNORMAL HIGH (ref 70–99)

## 2011-03-06 LAB — PROTIME-INR: INR: 3.03 — ABNORMAL HIGH (ref 0.00–1.49)

## 2011-03-06 LAB — CBC
HCT: 27.3 % — ABNORMAL LOW (ref 39.0–52.0)
MCV: 89.2 fL (ref 78.0–100.0)
Platelets: 208 10*3/uL (ref 150–400)
RBC: 3.06 MIL/uL — ABNORMAL LOW (ref 4.22–5.81)
WBC: 8.4 10*3/uL (ref 4.0–10.5)

## 2011-03-07 LAB — CBC
HCT: 26.8 % — ABNORMAL LOW (ref 39.0–52.0)
MCH: 29.6 pg (ref 26.0–34.0)
MCV: 89 fL (ref 78.0–100.0)
Platelets: 193 10*3/uL (ref 150–400)
RBC: 3.01 MIL/uL — ABNORMAL LOW (ref 4.22–5.81)
WBC: 8.7 10*3/uL (ref 4.0–10.5)

## 2011-03-07 LAB — BASIC METABOLIC PANEL
BUN: 30 mg/dL — ABNORMAL HIGH (ref 6–23)
CO2: 23 mEq/L (ref 19–32)
Glucose, Bld: 138 mg/dL — ABNORMAL HIGH (ref 70–99)
Potassium: 5.1 mEq/L (ref 3.5–5.1)
Sodium: 139 mEq/L (ref 135–145)

## 2011-03-07 LAB — GLUCOSE, CAPILLARY
Glucose-Capillary: 132 mg/dL — ABNORMAL HIGH (ref 70–99)
Glucose-Capillary: 130 mg/dL — ABNORMAL HIGH (ref 70–99)

## 2011-03-08 LAB — BASIC METABOLIC PANEL
BUN: 31 mg/dL — ABNORMAL HIGH (ref 6–23)
CO2: 24 mEq/L (ref 19–32)
Calcium: 9.2 mg/dL (ref 8.4–10.5)
Chloride: 106 mEq/L (ref 96–112)
Creatinine, Ser: 1.7 mg/dL — ABNORMAL HIGH (ref 0.4–1.5)
GFR calc Af Amer: 50 mL/min — ABNORMAL LOW (ref >60)
GFR calc non Af Amer: 41 mL/min — ABNORMAL LOW (ref >60)
Glucose, Bld: 88 mg/dL (ref 70–99)
Potassium: 4.8 mEq/L (ref 3.5–5.1)
Sodium: 139 mEq/L (ref 135–145)

## 2011-03-08 LAB — PROTIME-INR
INR: 2.16 — ABNORMAL HIGH (ref 0.00–1.49)
INR: 1.82 — ABNORMAL HIGH (ref 0.00–1.49)
Prothrombin Time: 24.2 seconds — ABNORMAL HIGH (ref 11.6–15.2)
Prothrombin Time: 21.2 seconds — ABNORMAL HIGH (ref 11.6–15.2)

## 2011-03-08 LAB — GLUCOSE, CAPILLARY
Glucose-Capillary: 141 mg/dL — ABNORMAL HIGH (ref 70–99)
Glucose-Capillary: 86 mg/dL (ref 70–99)
Glucose-Capillary: 135 mg/dL — ABNORMAL HIGH (ref 70–99)

## 2011-03-08 LAB — CBC
HCT: 29.3 % — ABNORMAL LOW (ref 39.0–52.0)
Hemoglobin: 9.6 g/dL — ABNORMAL LOW (ref 13.0–17.0)
MCH: 29.1 pg (ref 26.0–34.0)
MCHC: 32.8 g/dL (ref 30.0–36.0)
MCV: 88.8 fL (ref 78.0–100.0)
Platelets: 213 10*3/uL (ref 150–400)
RBC: 3.3 MIL/uL — ABNORMAL LOW (ref 4.22–5.81)
RDW: 14.8 % (ref 11.5–15.5)
WBC: 9.2 10*3/uL (ref 4.0–10.5)

## 2011-03-08 NOTE — Consult Note (Signed)
NAMEHILBERT, [name] NO.:  [id number]  MEDICAL RECORD NO.:  [id number]           PATIENT TYPE:  LOCATION:                                 FACILITY:  PHYSICIAN:  Sheliah Plane, MD    DATE OF BIRTH:  [birth year]  DATE OF CONSULTATION:  02/24/2011 DATE OF DISCHARGE:                                CONSULTATION   REQUESTING PHYSICIAN:  [name], MD, [facility].  FOLLOW-UP CARDIOLOGIST:  [name], MD, [facility].  PRIMARY CARE:  Dr. [name], [facility].  REASON FOR CONSULTATION:  Ischemic cardiomyopathy and coronary occlusive disease.  HISTORY OF PRESENT ILLNESS:  The patient is a 60 year old male, admitted in the emergency room because of increasing chest pain.  He has known ischemic cardiomyopathy with ejection fraction in the 20% range.  He has had multiple admissions and visits to his primary doctor for chest pain and for treatment of congestive heart failure.  Because of this, he was referred back to Dr. Antoine Poche and admitted to the emergency room.  The patient is very limited in his activities with very short of breath with very minimal activity, notes that often he short of breath when laying flat.  He is on chronic home oxygen.  Unfortunately, continues to smoke.  On this admission, his peak CK was 200, MB 5.5, peak troponin was 0.35.  His hemoglobin A1c is 9.5.  His BNP was 9177.  He has had multiple cardiac catheterizations and known to have severe three-vessel coronary artery disease with diffuse disease throughout the LAD.  On this admission, a repeat cath was performed on May 29.  He has had a prior angioplasty of the LAD in 2002.  He does have a history of hypertension, hyperlipidemia, type 2 diabetes with neuropathy. As noted, his hemoglobin A1c is 9.5.  He is a long-term smoker since age of 1. He continues to smoke.  He denies any previous stroke.  COPD is severe, on home oxygen.  He is followed by Dr. Sherene Sires.  Mild renal  insufficiency. Baseline creatinine 1.25.  PAST MEDICAL PROBLEMS:  Multiple episodes of DVT, on chronic Coumadin, history of hyperthyroidism, history of chronic renal insufficiency, history of anxiety, depression, obesity, history of unexplained anemia. He does have known colon polyps.  PREVIOUS SURGERY:  Includes, TURP, history of Fournier's gangrene status post debridement x2, history of spinal fusions, uses a cane to walk.  SOCIAL HISTORY:  The patient is single, lives alone.  He is on long-term disability.  He does drink alcohol, notes rare now, had been heavier in the past.  MEDICATIONS:  Include, albuterol, aspirin, Lipitor, Coreg, digoxin, Altace, Cardura, Levemir, Imdur, Synthroid, and Flomax.  DRUG ALLERGIES:  Include, ACTOS and ACE INHIBITORS.  REVIEW OF SYSTEMS:  CARDIAC:  Include orthopnea, significant lower extremity edema, exertional and rest shortness of breath, and chest pain.  GENERAL:  The patient does have constitutional symptoms.  Denies fever, chills, or night sweats, but has extreme fatigue.  RESPIRATORY: Notes shortness of breath with minimal exertion.  He is on home O2.  He does have a history of chronic anemia.  Previous  spinal fusions with diffuse joint problems.  Does have a psychiatric history with anxiety, depression.  PHYSICAL EXAM:  VITAL SIGNS:  Blood pressure 132/81, pulse 94 and sinus, his O2 sats 93% on 4 liters.  He is 5 feet 9.5 inches tall.  Usual weight at home was 236 pounds.  His current weight is now 238.5 pounds. GENERAL:  The patient is slightly disheveled looking with a very flat affect.  He is able to relay his history. CARDIAC:  I do not appreciate carotid bruits, though he does have a known carotid stenosis on the right, 60%-80%. RESPIRATORY:  Noticed crackles at the bases.  No active wheezing currently. ABDOMEN:  Moderately obese abdomen without palpable masses. EXTREMITIES:  His lower extremities with 2+ edema.  He has  palpable distal pulses.  His ABIs are falsely elevated due to doubt diabetic calcification, in his right is 1.4, left is 2.1.  LABORATORIES:  Reveal as noted above BNP of 9177.  Hemoglobin is 9.1, hematocrit 27.5, white count 8.7, platelet count 225,000.  Cardiac catheterization films were reviewed.  The patient has significant pulmonary hypertension.  PA pressures are 71/34, wedge of 33.  Cardiac index of 2.82.  Ejection fraction in single ventriculogram shows at less than 20%.  Echocardiogram shows both systolic and diastolic dysfunction, ejection fraction 20%-30%.  Cardiac catheterization films show 60%-70%, proximal right LAD has 90% lesion, but a very diffuse disease all the way to the apex.  There is a moderate-sized intermediate with 80% circumflex with 80%-90%.  IMPRESSION: 1. Severe left ventricular dysfunction, both acute and chronic,     diastolic and systolic dysfunction with ejection fraction of 20% or     less, associated with pulmonary hypertension, and a BNP of 9177. 2. Poorly controlled diabetes with a hemoglobin A1c of 9.4. 3. Chronic obstructive pulmonary disease, severe, on home oxygen. 4. Anemia of unknown cause.  The patient is a poor operative candidate both secondary to distal disease, but primarily his severe underlying LV dysfunction, which show symptomatically imaging and with associated pulmonary hypertension, all appear to be significant.  With the patient's continued smoking in the face of diabetes, if he does not quit, the likelihood of any improvement is minimal, the potential for bypass surgery with LVET backup can be considered.  Further evaluation of the patient's underlying pulmonary disease is recommended.  We can consider MRI for a viability study, but with the combination of the echo, ventriculogram, pulmonary hypertension, and underlying admission and severe ongoing symptoms of congestive heart failure.  This may not help much and under  overall decision about which want to proceed.  I will review the case with Dr. Antoine Poche after we have further evaluation of his pulmonary function tests.     Sheliah Plane, MD     EG/MEDQ  D:  02/25/2011  T:  02/25/2011  Job:  [id number]  cc:   [name], MD, [facility], MD  Electronically Signed by Sheliah Plane MD on 03/08/2011 09:32:39 AM

## 2011-03-09 DIAGNOSIS — I2510 Atherosclerotic heart disease of native coronary artery without angina pectoris: Secondary | ICD-10-CM

## 2011-03-09 LAB — GLUCOSE, CAPILLARY
Glucose-Capillary: 92 mg/dL (ref 70–99)
Glucose-Capillary: 133 mg/dL — ABNORMAL HIGH (ref 70–99)
Glucose-Capillary: 143 mg/dL — ABNORMAL HIGH (ref 70–99)

## 2011-03-09 LAB — CBC
HCT: 26.9 % — ABNORMAL LOW (ref 39.0–52.0)
MCH: 30 pg (ref 26.0–34.0)
MCV: 88.8 fL (ref 78.0–100.0)
RDW: 15 % (ref 11.5–15.5)
WBC: 8.4 10*3/uL (ref 4.0–10.5)

## 2011-03-09 LAB — PROTIME-INR: INR: 1.65 — ABNORMAL HIGH (ref 0.00–1.49)

## 2011-03-10 ENCOUNTER — Inpatient Hospital Stay (HOSPITAL_COMMUNITY): Payer: Medicare Other

## 2011-03-10 LAB — URINALYSIS, ROUTINE W REFLEX MICROSCOPIC
Bilirubin Urine: NEGATIVE
Glucose, UA: NEGATIVE mg/dL
Hgb urine dipstick: NEGATIVE
Ketones, ur: NEGATIVE mg/dL
Leukocytes, UA: NEGATIVE
Nitrite: NEGATIVE
Protein, ur: 30 mg/dL — AB
Specific Gravity, Urine: 1.012 (ref 1.005–1.030)
Urobilinogen, UA: 0.2 mg/dL (ref 0.0–1.0)
pH: 5 (ref 5.0–8.0)

## 2011-03-10 LAB — BLOOD GAS, ARTERIAL
Acid-base deficit: 0.8 mmol/L (ref 0.0–2.0)
Bicarbonate: 23.5 mEq/L (ref 20.0–24.0)
Drawn by: 244801
FIO2: 0.21 %
O2 Saturation: 92.4 %
Patient temperature: 98.6
TCO2: 24.7 mmol/L (ref 0–100)
pCO2 arterial: 39.3 mmHg (ref 35.0–45.0)
pH, Arterial: 7.394 (ref 7.350–7.450)
pO2, Arterial: 64.7 mmHg — ABNORMAL LOW (ref 80.0–100.0)

## 2011-03-10 LAB — COMPREHENSIVE METABOLIC PANEL
ALT: 26 U/L (ref 0–53)
AST: 18 U/L (ref 0–37)
Albumin: 2.9 g/dL — ABNORMAL LOW (ref 3.5–5.2)
Alkaline Phosphatase: 107 U/L (ref 39–117)
BUN: 39 mg/dL — ABNORMAL HIGH (ref 6–23)
CO2: 25 mEq/L (ref 19–32)
Calcium: 8.7 mg/dL (ref 8.4–10.5)
Chloride: 105 mEq/L (ref 96–112)
Creatinine, Ser: 1.84 mg/dL — ABNORMAL HIGH (ref 0.4–1.5)
GFR calc Af Amer: 45 mL/min — ABNORMAL LOW (ref >60)
GFR calc non Af Amer: 38 mL/min — ABNORMAL LOW (ref >60)
Glucose, Bld: 93 mg/dL (ref 70–99)
Potassium: 4.3 mEq/L (ref 3.5–5.1)
Sodium: 141 mEq/L (ref 135–145)
Total Bilirubin: 0.3 mg/dL (ref 0.3–1.2)
Total Protein: 5.9 g/dL — ABNORMAL LOW (ref 6.0–8.3)

## 2011-03-10 LAB — CBC
HCT: 26.4 % — ABNORMAL LOW (ref 39.0–52.0)
Hemoglobin: 8.8 g/dL — ABNORMAL LOW (ref 13.0–17.0)
MCH: 29.6 pg (ref 26.0–34.0)
MCHC: 33.3 g/dL (ref 30.0–36.0)
MCV: 88.9 fL (ref 78.0–100.0)
Platelets: 207 10*3/uL (ref 150–400)
RBC: 2.97 MIL/uL — ABNORMAL LOW (ref 4.22–5.81)
RDW: 14.9 % (ref 11.5–15.5)
WBC: 8 10*3/uL (ref 4.0–10.5)

## 2011-03-10 LAB — GLUCOSE, CAPILLARY
Glucose-Capillary: 113 mg/dL — ABNORMAL HIGH (ref 70–99)
Glucose-Capillary: 129 mg/dL — ABNORMAL HIGH (ref 70–99)

## 2011-03-10 LAB — URINE MICROSCOPIC-ADD ON

## 2011-03-10 LAB — PROTIME-INR
INR: 1.19 (ref 0.00–1.49)
Prothrombin Time: 15.3 seconds — ABNORMAL HIGH (ref 11.6–15.2)

## 2011-03-10 LAB — APTT: aPTT: 89 seconds — ABNORMAL HIGH (ref 24–37)

## 2011-03-10 LAB — HEPARIN LEVEL (UNFRACTIONATED): Heparin Unfractionated: 0.36 IU/mL (ref 0.30–0.70)

## 2011-03-11 LAB — CBC
HCT: 29.8 % — ABNORMAL LOW (ref 39.0–52.0)
Hemoglobin: 9.7 g/dL — ABNORMAL LOW (ref 13.0–17.0)
MCH: 28.9 pg (ref 26.0–34.0)
MCHC: 32.6 g/dL (ref 30.0–36.0)
MCV: 88.7 fL (ref 78.0–100.0)
Platelets: 224 10*3/uL (ref 150–400)
RBC: 3.36 MIL/uL — ABNORMAL LOW (ref 4.22–5.81)
RDW: 14.6 % (ref 11.5–15.5)
WBC: 8.6 10*3/uL (ref 4.0–10.5)

## 2011-03-11 LAB — PROTIME-INR
INR: 1.08 (ref 0.00–1.49)
Prothrombin Time: 14.2 seconds (ref 11.6–15.2)

## 2011-03-11 LAB — BASIC METABOLIC PANEL
BUN: 40 mg/dL — ABNORMAL HIGH (ref 6–23)
CO2: 29 mEq/L (ref 19–32)
Calcium: 9.2 mg/dL (ref 8.4–10.5)
Chloride: 105 mEq/L (ref 96–112)
Creatinine, Ser: 1.77 mg/dL — ABNORMAL HIGH (ref 0.4–1.5)
GFR calc Af Amer: 48 mL/min — ABNORMAL LOW (ref >60)
GFR calc non Af Amer: 39 mL/min — ABNORMAL LOW (ref >60)
Glucose, Bld: 108 mg/dL — ABNORMAL HIGH (ref 70–99)
Potassium: 4.5 mEq/L (ref 3.5–5.1)
Sodium: 142 mEq/L (ref 135–145)

## 2011-03-11 LAB — GLUCOSE, CAPILLARY
Glucose-Capillary: 109 mg/dL — ABNORMAL HIGH (ref 70–99)
Glucose-Capillary: 126 mg/dL — ABNORMAL HIGH (ref 70–99)

## 2011-03-11 LAB — HEPARIN LEVEL (UNFRACTIONATED): Heparin Unfractionated: 0.44 IU/mL (ref 0.30–0.70)

## 2011-03-12 LAB — CBC
HCT: 28.1 % — ABNORMAL LOW (ref 39.0–52.0)
MCH: 29.1 pg (ref 26.0–34.0)
MCHC: 32.7 g/dL (ref 30.0–36.0)
MCV: 88.9 fL (ref 78.0–100.0)
Platelets: 225 10*3/uL (ref 150–400)
RDW: 14.5 % (ref 11.5–15.5)
WBC: 7.4 10*3/uL (ref 4.0–10.5)

## 2011-03-15 LAB — CROSSMATCH
ABO/RH(D): A POS
Antibody Screen: NEGATIVE
Unit division: 0
Unit division: 0

## 2011-03-16 ENCOUNTER — Ambulatory Visit: Payer: Medicare Other | Admitting: Family Medicine

## 2011-03-24 ENCOUNTER — Ambulatory Visit: Payer: Medicare Other | Admitting: Cardiothoracic Surgery

## 2011-03-29 NOTE — Discharge Summary (Signed)
[name], [name] NO.:  [id number]  MEDICAL RECORD NO.:  [id number]  LOCATION:  3710                         FACILITY:  [facility]  PHYSICIAN:  [name], MD, FACCDATE OF BIRTH:  August 11, 1951  DATE OF ADMISSION:  02/20/2011 DATE OF DISCHARGE:  03/12/2011                              DISCHARGE SUMMARY   PRIMARY CARDIOLOGIST:  [name], MD, [facility].  PRIMARY CARE PROVIDER:  [name], MD at [facility].  ACCEPTING PHYSICIAN:  At [facility], Dr. Sabra Heck Kong  DISCHARGE DIAGNOSIS:  Unstable angina.  SECONDARY DIAGNOSES: 1. Coronary artery disease with known severe multivessel coronary     artery disease.     a.     Status post non-ST-elevation myocardial infarction in 2002      with PCI to the LAD and diagonal.     b.     Status post PCI of the right coronary artery in 2007. 2. Acute-on-chronic systolic congestive heart failure/ischemic     cardiomyopathy with an EF of 20% to 25% with diffuse hypokinesis,     apical along with basal to mid inferior-posterior akinesis by echo     this admission. 3. Moderate pulmonary arterial and venous hypertension. 4. Tobacco abuse -- off cigarettes since admission on Feb 20, 2011. 5. Chronic obstructive pulmonary disease on home O2. 6. Severe restrictive airway disease with severe diffusion defect by     pulmonary function testing this admission. 7. Hypertension. 8. Hyperlipidemia. 9. Stage III chronic kidney disease. 10.Hypothyroidism. 11.Obstructive sleep apnea. 12.History of deep venous thrombosis on chronic Coumadin, which has     been on hold during this admission. 13.Obesity. 14.Anxiety. 15.Diabetes mellitus with history of diabetic retinopathy. 16.Fournier gangrene. 17.Osteoarthritis. 18.Intermittent left bundle branch block. 19.History of urinary retention. 20.Status post spinal fusion in 1987. 21.Status post tonsillectomy. 22.Stable normocytic anemia with normal  EGD in February 2012. 23.Colon polyps on colonoscopy without evidence of bleeding at that     time.  ALLERGIES: 1. ACTOS. 2. ALTACE.  PROCEDURES: 1. A 2-D echocardiogram on Feb 24, 2011, showing an EF of 25% to 30%     with diffuse hypokinesis and mild LVH.  There is akinesis of the     apical as well as basal-to-mid inferoposterior myocardium.  There     is grade 2 diastolic dysfunction.  Mild MR.  Mildly dilated left     atrium.  Mildly dilated right atrium.  Atrial septal bone     consistent with increased left atrial pressure.  PASP 58 mmHg.     Trivial pericardial effusion. 2. Right and left heart cardiac catheterization performed on Feb 24, 2011, revealing RA 21/20 (17) and RV 73/20.  PA 71/34 (47), PCWP     33, cardiac output 6.25 L per minute, cardiac index 2.82 L per     minute per m2.  PVR 2.2.  EF was 20% to 25% with severe global     hypokinesis and anterolateral and apical akinesis.  Left main     normal.  LAD 8% ostial with patent stent in the mid vessel.  There     is diffuse 80%  stenosis throughout the mid LAD and then diffuse 90%     to 95% stenosis throughout the apical LAD.  Left circumflex 36%     ostial stenosis followed by 95% stenosis in the midsection of the     artery after the OM2.  The OM1 had a 90% to 95% stenosis.  Right     coronary artery had a 60% to 70% proximal long stenosis.  There is     a patent stent in the distal RCA.  There is diffuse 80% to 90%     stenosis distal to the stent. 3. Carotid Dopplers revealing a 60% to 79% stenosis in the right     internal carotid artery without significant left internal carotid     artery stenosis.  There was antegrade flow in vertebrals     bilaterally. 4. Arterial brachial indices of the lower extremities showing     triphasic wave forms throughout with an ABI of 1.4 on the right and     1.1 on left.  It was felt that ABIs were falsely elevated due to     diabetic calcification. 5. Pulmonary function  testing performed on Feb 25, 2011, FVC 1.79     (38%), FEV-1 1.64 (46%), FEV-1/FVC 92%, F25-75 of 2.9 (102%) DLCO     UNC 35%, DLCO COR 44%, AD/VA 82%, and VA 53%.  Conclusion:     Diffusion defect with increase FEV-1/FVC ratio and reduced FVC     suggesting early interstitial process.  In view of the severity of     diffusion defect, a study at the bedside would be helpful to     evaluate at present of hypoxemia.  Overall minimal obstructive     airway disease with severe interstitial restriction and severe     diffusion defect. 6. Preoperative vein mapping or lower extremity vein mapping on March 01, 2011, showing patent and compressible major veins of a lower     extremities.  HISTORY OF PRESENT ILLNESS:  A 60 year old male with prior history of coronary artery disease status post prior LAD, diagonal, and right coronary artery interventions in the past.  The patient has known severe multivessel disease and has been medically managed since 2007 with frequent hospitalizations for angina.  The patient was in his usual state of health until approximately 1 week prior to admission and began to have increasing symptoms of rest and exertional substernal chest discomfort requiring nitrates.  As well as present progression of symptoms he presented to the [facility] ED on Feb 21, 2011, where ECG was nonacute and point-of-care markers were negative.  He was admitted for evaluation and management.  HOSPITAL COURSE:  The patient ruled out for MI by cardiac enzymes.  As the patient is on chronic Coumadin therapy in the setting of history of DVT, his Coumadin was placed on hold and he was bridged with heparin. Decision was made to pursue diagnostic cardiac catheterization. However, over the weekend the patient was then diuresed secondary to volume overload and evidence of pulmonary vascular congestion on chest x- ray.  The patient was taken to the cath lab on Feb 23, 2011, for  diagnostics right and left heart cardiac heart cardiac catheterization showed severe multivessel coronary artery disease with severe global LV dysfunction and markedly elevated right heart pressures.  Cardiac output index were within normal limits.  His wedge pressure was 33 and it was felt that the patient would require additional diuresis.  Further, it  was thought that there were no role for percutaneous intervention and surgical evaluation was prudent.  The patient was seen by Dr. Sheliah Plane with [facility] on Feb 24, 2011, and felt that the patient was likely a poor candidate for coronary artery bypass grafting given his multiple comorbidities, severe disease, LV dysfunction, renal insufficiency, and ongoing tobacco abuse.  He recommended pulmonary function testing to clarify the situation further and this was performed on Feb 24, 2011, suggesting minimal obstructive airway disease with severe restriction and severe diffusion defect as outlined above.  After additional discussion between Cardiology and Surgery, it was felt the patient would benefit most from medical therapy and attempts were made to wean the patient off of IV nitroglycerin.  Unfortunately, the patient continued to have intermittent episodes of recurrent angina requiring nitrates. He was placed on Ranexa therapy, however this do not significantly change his episodes of angina.  The patient was subsequently seen by Dr. Kathlee Nations Trigt also of [facility] on March 01, 2011, who reviewed the patient's chart and once again outlined for the patient that he would be high risk from a surgical standpoint, but with his recurrence of angina, it may actually be his best option as he was failing medical therapy.  It was felt ideal that the patient be off cigarettes for 3 weeks prior to CABG with initial plan for discharge from the hospital and outpatient followup with Thoracic Surgery.   Unfortunately, however the patient continued to have intermittent chest discomfort requiring nitrates and it was mutually agreed between Cardiology, surgery, and the patient that coronary artery bypass grafting should be performed sooner rather than later despite the multiple risks.  Surgery was set for the morning of March 12, 2011, however the patient's sister came to visit the hospital and spoke with Dr. Donata Clay and from this discussion, the patient and family requested a second opinion and thus transfer to [facility] for further evaluation. We have been in contact with Dr. Oliver Pila, at [facility] who has accepted the patient in transfer today.  Of note, the patient has exhibited a stable chronic normocytic anemia throughout this admission with hemoglobins in the high 8 and high 9 range and hematocrit between 26 and 29.  Further, the patient has a history of stage II to III chronic kidney disease, in the setting of diuresis has had creatinine generally in the 16 to 17 range.  LABORATORY DATA:  At the time of transfer hemoglobin 9.2, hematocrit 28.1, WBC 7.4, platelets 225, and INR 1.1.  Sodium 142, potassium 4.5, chloride 105, CO2 of 29, BUN 40, creatinine 1.77, glucose 108, total bilirubin 0.3, alkaline phosphatase 107, AST 18, ALT 26, total protein 5.9, albumin 2.9, calcium 9.2, hemoglobin A1c 9.4, CK 113, MB 5.3, and troponin-I less than 0.30.  Urinalysis was negative.  MRSA screen was negative.  DISPOSITION:  The patient will be transferred to [facility] today at his request for a second opinion regarding management of his coronary artery disease.  MEDICATIONS:  At the time of transfer; 1. Acetaminophen 325 mg 2 tabs q.4 h. p.r.n. 2. Albuterol 2.5 mg per 3 mL. q.i.d. 3. Alprazolam 0.25 mg b.i.d. p.r.n. 4. Aspirin 325 mg daily. 5. Carvedilol 12.5 mg q.12 h. 6. Digoxin 0.125 mg daily. 7. Doxazosin 1 mg  daily. 8. Heparin infusion. 9. NovoLog sliding scale insulin t.i.d. with meals. 10.Lantus 40 units at bedtime. 11.Atrovent 0.5 mg q.i.d. 12.Isosorbide dinitrate 40  mg t.i.d. 13.Levothyroxine 100 mcg daily. 14.Lorazepam 0.5 mg 1-2 tabs q.4 h. p.r.n. 15.Magnesium hydroxide 30 mL as needed. 16.Morphine 2 mg per mL 4 mg q.2 h. p.r.n. 17.Nitroglycerin 0.4 mg p.r.n. chest pain. 18.Zofran 4 mg q.6 h. p.r.n. 19.Ranexa 500 mg b.i.d. 20.Rosuvastatin 40 mg daily. 21.Flomax 0.4 mg daily. 22.Torsemide 20 mg b.i.d. 23.Zolpidem 10 mg at bedtime p.r.n. 24.Saline flush.  OUTSTANDING LAB STUDIES:  None.  DURATION DISCHARGE ENCOUNTER:  90 minutes including physician time.     Nicolasa Ducking, ANP   ______________________________ [name], MD, [facility]    CB/MEDQ  D:  03/12/2011  T:  03/12/2011  Job:  [id number]  cc:   Dr. Biagio Quint, MD  Electronically Signed by Nicolasa Ducking ANP on 03/19/2011 02:31:52 PM Electronically Signed by [name] MD [facility] on 03/29/2011 02:26:36 PM

## 2011-04-06 ENCOUNTER — Emergency Department (HOSPITAL_COMMUNITY): Payer: Medicare Other

## 2011-04-06 ENCOUNTER — Inpatient Hospital Stay (HOSPITAL_COMMUNITY)
Admission: EM | Admit: 2011-04-06 | Discharge: 2011-04-15 | DRG: 308 | Disposition: A | Discharge status: Skilled Nursing Facility | Payer: Medicare Other | Attending: Cardiovascular Disease | Admitting: Cardiovascular Disease

## 2011-04-06 DIAGNOSIS — R5381 Other malaise: Secondary | ICD-10-CM | POA: Diagnosis present

## 2011-04-06 DIAGNOSIS — Z6835 Body mass index (BMI) 35.0-35.9, adult: Secondary | ICD-10-CM

## 2011-04-06 DIAGNOSIS — E039 Hypothyroidism, unspecified: Secondary | ICD-10-CM | POA: Diagnosis present

## 2011-04-06 DIAGNOSIS — N183 Chronic kidney disease, stage 3 unspecified: Secondary | ICD-10-CM | POA: Diagnosis present

## 2011-04-06 DIAGNOSIS — I4891 Unspecified atrial fibrillation: Principal | ICD-10-CM | POA: Diagnosis present

## 2011-04-06 DIAGNOSIS — Z951 Presence of aortocoronary bypass graft: Secondary | ICD-10-CM

## 2011-04-06 DIAGNOSIS — Z860100 Personal history of colon polyps, unspecified: Secondary | ICD-10-CM

## 2011-04-06 DIAGNOSIS — I2789 Other specified pulmonary heart diseases: Secondary | ICD-10-CM | POA: Diagnosis present

## 2011-04-06 DIAGNOSIS — I509 Heart failure, unspecified: Secondary | ICD-10-CM

## 2011-04-06 DIAGNOSIS — J984 Other disorders of lung: Secondary | ICD-10-CM | POA: Diagnosis present

## 2011-04-06 DIAGNOSIS — Z7901 Long term (current) use of anticoagulants: Secondary | ICD-10-CM

## 2011-04-06 DIAGNOSIS — R339 Retention of urine, unspecified: Secondary | ICD-10-CM | POA: Diagnosis present

## 2011-04-06 DIAGNOSIS — J4489 Other specified chronic obstructive pulmonary disease: Secondary | ICD-10-CM | POA: Diagnosis present

## 2011-04-06 DIAGNOSIS — E875 Hyperkalemia: Secondary | ICD-10-CM | POA: Diagnosis present

## 2011-04-06 DIAGNOSIS — E785 Hyperlipidemia, unspecified: Secondary | ICD-10-CM | POA: Diagnosis present

## 2011-04-06 DIAGNOSIS — I129 Hypertensive chronic kidney disease with stage 1 through stage 4 chronic kidney disease, or unspecified chronic kidney disease: Secondary | ICD-10-CM | POA: Diagnosis present

## 2011-04-06 DIAGNOSIS — G4733 Obstructive sleep apnea (adult) (pediatric): Secondary | ICD-10-CM | POA: Diagnosis present

## 2011-04-06 DIAGNOSIS — Z981 Arthrodesis status: Secondary | ICD-10-CM

## 2011-04-06 DIAGNOSIS — Z86718 Personal history of other venous thrombosis and embolism: Secondary | ICD-10-CM

## 2011-04-06 DIAGNOSIS — I5023 Acute on chronic systolic (congestive) heart failure: Secondary | ICD-10-CM | POA: Diagnosis present

## 2011-04-06 DIAGNOSIS — E669 Obesity, unspecified: Secondary | ICD-10-CM | POA: Diagnosis present

## 2011-04-06 DIAGNOSIS — I2510 Atherosclerotic heart disease of native coronary artery without angina pectoris: Secondary | ICD-10-CM | POA: Diagnosis present

## 2011-04-06 DIAGNOSIS — M1990 Unspecified osteoarthritis, unspecified site: Secondary | ICD-10-CM | POA: Diagnosis present

## 2011-04-06 DIAGNOSIS — I447 Left bundle-branch block, unspecified: Secondary | ICD-10-CM | POA: Diagnosis present

## 2011-04-06 DIAGNOSIS — J90 Pleural effusion, not elsewhere classified: Secondary | ICD-10-CM | POA: Diagnosis present

## 2011-04-06 DIAGNOSIS — I252 Old myocardial infarction: Secondary | ICD-10-CM

## 2011-04-06 DIAGNOSIS — I2589 Other forms of chronic ischemic heart disease: Secondary | ICD-10-CM | POA: Diagnosis present

## 2011-04-06 DIAGNOSIS — Z9981 Dependence on supplemental oxygen: Secondary | ICD-10-CM

## 2011-04-06 DIAGNOSIS — E119 Type 2 diabetes mellitus without complications: Secondary | ICD-10-CM | POA: Diagnosis present

## 2011-04-06 DIAGNOSIS — J449 Chronic obstructive pulmonary disease, unspecified: Secondary | ICD-10-CM | POA: Diagnosis present

## 2011-04-06 DIAGNOSIS — Z9861 Coronary angioplasty status: Secondary | ICD-10-CM

## 2011-04-06 DIAGNOSIS — Z87891 Personal history of nicotine dependence: Secondary | ICD-10-CM

## 2011-04-06 DIAGNOSIS — F411 Generalized anxiety disorder: Secondary | ICD-10-CM | POA: Diagnosis present

## 2011-04-06 DIAGNOSIS — Z7982 Long term (current) use of aspirin: Secondary | ICD-10-CM

## 2011-04-06 LAB — COMPREHENSIVE METABOLIC PANEL
ALT: 51 U/L (ref 0–53)
BUN: 54 mg/dL — ABNORMAL HIGH (ref 6–23)
CO2: 21 mEq/L (ref 19–32)
Calcium: 8.6 mg/dL (ref 8.4–10.5)
Creatinine, Ser: 1.56 mg/dL — ABNORMAL HIGH (ref 0.50–1.35)
GFR calc Af Amer: 55 mL/min — ABNORMAL LOW (ref >60)
GFR calc non Af Amer: 45 mL/min — ABNORMAL LOW (ref >60)
Glucose, Bld: 138 mg/dL — ABNORMAL HIGH (ref 70–99)
Sodium: 135 mEq/L (ref 135–145)
Total Protein: 6.3 g/dL (ref 6.0–8.3)

## 2011-04-06 LAB — CBC
HCT: 32.2 % — ABNORMAL LOW (ref 39.0–52.0)
MCH: 29.6 pg (ref 26.0–34.0)
MCV: 89 fL (ref 78.0–100.0)
Platelets: 197 10*3/uL (ref 150–400)
RBC: 3.62 MIL/uL — ABNORMAL LOW (ref 4.22–5.81)
WBC: 14.4 10*3/uL — ABNORMAL HIGH (ref 4.0–10.5)

## 2011-04-06 LAB — DIFFERENTIAL
Eosinophils Absolute: 0.6 10*3/uL (ref 0.0–0.7)
Eosinophils Relative: 4 % (ref 0–5)
Lymphocytes Relative: 10 % — ABNORMAL LOW (ref 12–46)
Lymphs Abs: 1.5 10*3/uL (ref 0.7–4.0)
Monocytes Relative: 8 % (ref 3–12)
Neutrophils Relative %: 77 % (ref 43–77)

## 2011-04-06 LAB — BASIC METABOLIC PANEL
Calcium: 8.4 mg/dL (ref 8.4–10.5)
GFR calc Af Amer: 48 mL/min — ABNORMAL LOW (ref >60)
GFR calc non Af Amer: 40 mL/min — ABNORMAL LOW (ref >60)
Glucose, Bld: 168 mg/dL — ABNORMAL HIGH (ref 70–99)
Potassium: 5.3 mEq/L — ABNORMAL HIGH (ref 3.5–5.1)
Sodium: 137 mEq/L (ref 135–145)

## 2011-04-06 LAB — CARDIAC PANEL(CRET KIN+CKTOT+MB+TROPI)
CK, MB: 5.7 ng/mL — ABNORMAL HIGH (ref 0.3–4.0)
Total CK: 75 U/L (ref 7–232)

## 2011-04-06 LAB — CK TOTAL AND CKMB (NOT AT ARMC)
CK, MB: 5.1 ng/mL — ABNORMAL HIGH (ref 0.3–4.0)
Total CK: 92 U/L (ref 7–232)

## 2011-04-06 LAB — MAGNESIUM: Magnesium: 2.6 mg/dL — ABNORMAL HIGH (ref 1.5–2.5)

## 2011-04-06 LAB — PRO B NATRIURETIC PEPTIDE: Pro B Natriuretic peptide (BNP): 24016 pg/mL — ABNORMAL HIGH (ref 0–125)

## 2011-04-06 LAB — TROPONIN I: Troponin I: <0.3 ng/mL (ref <0.30)

## 2011-04-07 DIAGNOSIS — I5021 Acute systolic (congestive) heart failure: Secondary | ICD-10-CM

## 2011-04-07 LAB — BASIC METABOLIC PANEL
BUN: 54 mg/dL — ABNORMAL HIGH (ref 6–23)
CO2: 23 mEq/L (ref 19–32)
Calcium: 8.7 mg/dL (ref 8.4–10.5)
Glucose, Bld: 119 mg/dL — ABNORMAL HIGH (ref 70–99)
Potassium: 5.1 mEq/L (ref 3.5–5.1)
Sodium: 137 mEq/L (ref 135–145)

## 2011-04-07 LAB — GLUCOSE, CAPILLARY
Glucose-Capillary: 128 mg/dL — ABNORMAL HIGH (ref 70–99)
Glucose-Capillary: 105 mg/dL — ABNORMAL HIGH (ref 70–99)
Glucose-Capillary: 149 mg/dL — ABNORMAL HIGH (ref 70–99)

## 2011-04-07 LAB — CBC
MCV: 89.3 fL (ref 78.0–100.0)
Platelets: 191 10*3/uL (ref 150–400)
RBC: 3.36 MIL/uL — ABNORMAL LOW (ref 4.22–5.81)
RDW: 15.5 % (ref 11.5–15.5)
WBC: 11.3 10*3/uL — ABNORMAL HIGH (ref 4.0–10.5)

## 2011-04-07 LAB — CARDIAC PANEL(CRET KIN+CKTOT+MB+TROPI): Total CK: 72 U/L (ref 7–232)

## 2011-04-08 LAB — CBC
MCH: 28.4 pg (ref 26.0–34.0)
MCHC: 32 g/dL (ref 30.0–36.0)
MCV: 88.8 fL (ref 78.0–100.0)
Platelets: 198 10*3/uL (ref 150–400)
RDW: 15.2 % (ref 11.5–15.5)

## 2011-04-08 LAB — GLUCOSE, CAPILLARY
Glucose-Capillary: 140 mg/dL — ABNORMAL HIGH (ref 70–99)
Glucose-Capillary: 151 mg/dL — ABNORMAL HIGH (ref 70–99)
Glucose-Capillary: 119 mg/dL — ABNORMAL HIGH (ref 70–99)

## 2011-04-08 LAB — BASIC METABOLIC PANEL
Calcium: 8.7 mg/dL (ref 8.4–10.5)
Creatinine, Ser: 1.75 mg/dL — ABNORMAL HIGH (ref 0.50–1.35)
GFR calc Af Amer: 48 mL/min — ABNORMAL LOW (ref >60)
GFR calc non Af Amer: 40 mL/min — ABNORMAL LOW (ref >60)

## 2011-04-09 LAB — CARDIAC PANEL(CRET KIN+CKTOT+MB+TROPI): Total CK: 65 U/L (ref 7–232)

## 2011-04-09 LAB — BASIC METABOLIC PANEL
BUN: 54 mg/dL — ABNORMAL HIGH (ref 6–23)
Creatinine, Ser: 1.84 mg/dL — ABNORMAL HIGH (ref 0.50–1.35)
GFR calc Af Amer: 45 mL/min — ABNORMAL LOW (ref >60)
GFR calc non Af Amer: 38 mL/min — ABNORMAL LOW (ref >60)
Potassium: 4.1 mEq/L (ref 3.5–5.1)

## 2011-04-09 LAB — PROTIME-INR: Prothrombin Time: 15.2 seconds (ref 11.6–15.2)

## 2011-04-09 LAB — CBC
MCH: 28.6 pg (ref 26.0–34.0)
Platelets: 241 10*3/uL (ref 150–400)
RBC: 3.57 MIL/uL — ABNORMAL LOW (ref 4.22–5.81)
WBC: 11.4 10*3/uL — ABNORMAL HIGH (ref 4.0–10.5)

## 2011-04-09 LAB — GLUCOSE, CAPILLARY
Glucose-Capillary: 132 mg/dL — ABNORMAL HIGH (ref 70–99)
Glucose-Capillary: 99 mg/dL (ref 70–99)
Glucose-Capillary: 104 mg/dL — ABNORMAL HIGH (ref 70–99)

## 2011-04-10 LAB — BASIC METABOLIC PANEL
CO2: 28 mEq/L (ref 19–32)
Glucose, Bld: 122 mg/dL — ABNORMAL HIGH (ref 70–99)
Potassium: 4.4 mEq/L (ref 3.5–5.1)
Sodium: 139 mEq/L (ref 135–145)

## 2011-04-10 LAB — CBC
HCT: 30.1 % — ABNORMAL LOW (ref 39.0–52.0)
Hemoglobin: 9.6 g/dL — ABNORMAL LOW (ref 13.0–17.0)
MCH: 28.3 pg (ref 26.0–34.0)
MCHC: 31.9 g/dL (ref 30.0–36.0)
MCV: 88.8 fL (ref 78.0–100.0)

## 2011-04-10 LAB — GLUCOSE, CAPILLARY: Glucose-Capillary: 249 mg/dL — ABNORMAL HIGH (ref 70–99)

## 2011-04-10 LAB — CARDIAC PANEL(CRET KIN+CKTOT+MB+TROPI)
CK, MB: 4.1 ng/mL — ABNORMAL HIGH (ref 0.3–4.0)
Total CK: 58 U/L (ref 7–232)

## 2011-04-11 LAB — PROTIME-INR
INR: 1.46 (ref 0.00–1.49)
Prothrombin Time: 18 seconds — ABNORMAL HIGH (ref 11.6–15.2)

## 2011-04-11 LAB — GLUCOSE, CAPILLARY
Glucose-Capillary: 159 mg/dL — ABNORMAL HIGH (ref 70–99)
Glucose-Capillary: 176 mg/dL — ABNORMAL HIGH (ref 70–99)

## 2011-04-12 ENCOUNTER — Inpatient Hospital Stay (HOSPITAL_COMMUNITY): Payer: Medicare Other

## 2011-04-12 LAB — GLUCOSE, CAPILLARY
Glucose-Capillary: 153 mg/dL — ABNORMAL HIGH (ref 70–99)
Glucose-Capillary: 112 mg/dL — ABNORMAL HIGH (ref 70–99)

## 2011-04-13 LAB — CBC
Hemoglobin: 10.4 g/dL — ABNORMAL LOW (ref 13.0–17.0)
MCH: 28.1 pg (ref 26.0–34.0)
MCV: 89.5 fL (ref 78.0–100.0)
RBC: 3.7 MIL/uL — ABNORMAL LOW (ref 4.22–5.81)

## 2011-04-13 LAB — POTASSIUM: Potassium: 5.5 mEq/L — ABNORMAL HIGH (ref 3.5–5.1)

## 2011-04-13 LAB — BASIC METABOLIC PANEL
BUN: 52 mg/dL — ABNORMAL HIGH (ref 6–23)
Chloride: 100 mEq/L (ref 96–112)
GFR calc Af Amer: 42 mL/min — ABNORMAL LOW (ref >60)
Potassium: 5.6 mEq/L — ABNORMAL HIGH (ref 3.5–5.1)
Sodium: 140 mEq/L (ref 135–145)

## 2011-04-13 LAB — GLUCOSE, CAPILLARY
Glucose-Capillary: 155 mg/dL — ABNORMAL HIGH (ref 70–99)
Glucose-Capillary: 128 mg/dL — ABNORMAL HIGH (ref 70–99)

## 2011-04-13 LAB — PROTIME-INR: Prothrombin Time: 27.6 seconds — ABNORMAL HIGH (ref 11.6–15.2)

## 2011-04-14 LAB — GLUCOSE, CAPILLARY
Glucose-Capillary: 166 mg/dL — ABNORMAL HIGH (ref 70–99)
Glucose-Capillary: 161 mg/dL — ABNORMAL HIGH (ref 70–99)

## 2011-04-14 LAB — BASIC METABOLIC PANEL
BUN: 57 mg/dL — ABNORMAL HIGH (ref 6–23)
CO2: 30 mEq/L (ref 19–32)
Calcium: 8.9 mg/dL (ref 8.4–10.5)
Creatinine, Ser: 2.12 mg/dL — ABNORMAL HIGH (ref 0.50–1.35)
Glucose, Bld: 121 mg/dL — ABNORMAL HIGH (ref 70–99)

## 2011-04-14 LAB — PROTIME-INR: INR: 3.41 — ABNORMAL HIGH (ref 0.00–1.49)

## 2011-04-15 LAB — BASIC METABOLIC PANEL
BUN: 55 mg/dL — ABNORMAL HIGH (ref 6–23)
Chloride: 97 mEq/L (ref 96–112)
GFR calc Af Amer: 43 mL/min — ABNORMAL LOW (ref >60)
Glucose, Bld: 96 mg/dL (ref 70–99)
Potassium: 4.7 mEq/L (ref 3.5–5.1)
Sodium: 140 mEq/L (ref 135–145)

## 2011-04-15 LAB — GLUCOSE, CAPILLARY: Glucose-Capillary: 79 mg/dL (ref 70–99)

## 2011-04-19 ENCOUNTER — Encounter: Payer: Medicare Other | Admitting: *Deleted

## 2011-04-19 NOTE — H&P (Signed)
[name], [name]                 ACCOUNT NO.:  [id number]  MEDICAL RECORD NO.:  [id number]  LOCATION:  4712                         FACILITY:  [facility]  PHYSICIAN:  [name]. [name], MD, FACCDATE OF BIRTH:  1951-09-12  DATE OF ADMISSION:  04/06/2011 DATE OF DISCHARGE:                             HISTORY & PHYSICAL   PRIMARY CARDIOLOGIST:  [name], MD, [facility]  PRIMARY CARE PHYSICIAN:  [name], MD with [facility].  PATIENT PROFILE:  A 60 year old male with history of severe coronary artery disease status post recent coronary artery bypass grafting in June 2012 at [facility] who presents with AFib and RVR as well as heart failure.  PROBLEMS: 1. Atrial fibrillation with rapid ventricular response.     a.     Reported history of postop atrial fibrillation status post      cardioversion and amiodarone load at [facility] recently. 2. Acute on chronic systolic congestive heart failure.     a.     Feb 24, 2011, 2-D echocardiogram EF 25% to 30% with diffuse      hypokinesis as well as akinesis of the apical myocardium and basal      to mid inferoposterior myocardium.  Grade 2 diastolic dysfunction.      PAS 258 mmHg.  Mild mitral regurgitation. 3. Coronary artery disease.     a.     Status post non-ST-elevation MI in 2002 with PCI of the LAD      and diagonal.     b.     Status post PCI of the right coronary artery in 2007.     c.     Status post coronary artery bypass grafting at [facility] in June 2012.  Records not currently      available. 4. Stage III chronic kidney disease with baseline creatinine of     approximately 1.8. 5. Hypertension. 6. Hyperlipidemia. 7. Type 2 diabetes mellitus. 8. Ischemic cardiomyopathy. 9. Moderate pulmonary and arterial venous hypertension. 10.Remote tobacco abuse quitting on Feb 20, 2011. 11.COPD on home O2. 12.Deconditioning. 13.Restrictive airway disease with severe diffusion defect by  pulmonary function testing in June 2012. 14.Hypothyroidism. 15.Obstructive sleep apnea. 16.History of DVT previously on chronic Coumadin therapy. 17.Obesity. 18.Anxiety. 19.Fournier gangrene 20.Osteoarthritis. 21.History of intermittent left bundle-branch block. 22.History of urinary retention. 23.Status post spinal fusion in 1987. 24.Status post tonsillectomy. 25.History of normocytic anemia with normal EGD February 2012. 26.History of colon polyps.  ALLERGIES:  ACTOS and ALTACE (cough).  HISTORY OF PRESENT ILLNESS:  A 60 year old male with the above complex problem list.  The patient was admitted to [name] from Feb 20, 2011, to March 12, 2011, with chest pain and catheterization revealing severe multivessel coronary artery disease with an EF of 20% to 25%.  The patient was evaluated by Cardiothoracic Surgery and he was initially felt to be a poor candidate.  Decision was alternating made, given recurrent symptoms with limited options that high risk surgery would be appropriate.  Surgery was initially planned for March 12, 2011, at [facility]; however, the patient and family member decided that they would prefer a second opinion  and he was transferred to [facility] on March 12, 2011. Records from [name] are not currently unavailable, but from history that is provided by the patient, he ends up having his coronary artery bypass grafting while at [name].  On postoperative day #7 he apparently had AFib with RVR and was loaded with amiodarone and subsequently cardioverted. He is not aware if he was discharged on anticoagulation or why he may not have been though based on the [name] from the rehab facility to which he was discharged, he is not currently on Coumadin.  His INR is within normal limits.  Last night, the patient developed dyspnea while sitting, which worsened while lying flat.  He had a quite restless night and also besides dyspnea developed chest discomfort.  Denied any  palpitations.  This morning because of persistent symptoms, he was taken to the [name] ED where he was found be in AFib with RVR.  He was initially placed on diltiazem infusion, but this was discontinued given that he was also on amiodarone and he was instead loaded with IV amiodarone and promptly converted back to sinus rhythm.  On exam, he is volume overloaded with a BNP greater than 24,000.  His renal function appears to be stable, although his potassium is elevated at 5.3.  He is currently pain free and quite sleepy as he did say that he did not sleep well last night.  HOME MEDICATIONS: 1. Atrovent HFA 2 puffs q.i.d. p.r.n. 2. Albuterol p.r.n. 3. Toprol-XL 12.5 mg b.i.d. 4. Aspirin 81 mg daily. 5. Folic acid 1 mg daily. 6. Synthroid 75 mcg daily. 7. Flomax 0.4 mg daily. 8. NovoLog 3 units with breakfast and lunch, 5 units with dinner. 9. Lasix 60 mg b.i.d. 10.Potassium chloride 20 mEq b.i.d. 11.Amiodarone 200 mg daily.  FAMILY HISTORY:  The patient is unaware of his medical history of his parents.  SOCIAL HISTORY:  The patient was living by himself in [name] but currently staying at a rehab facility ([facility]).  He is divorced and has one daughter.  He has 40-pack year history of tobacco abuse, quitting in May 2012.  He was previously drinking an occasional beer.  He does not use drugs and not routinely exercising.  REVIEW OF SYSTEMS:  Positive for dyspnea, orthopnea, chest pressure, and lower extremity edema.  He is weak.  He is currently tired.  He is a full code.  Otherwise, all systems reviewed and is negative.  PHYSICAL EXAMINATION:  VITAL SIGNS:  Temperature 99, heart rate 94, respirations 18, blood pressure 105/77, pulse ox 100% on 3 L. GENERAL:  Pleasant white male in no acute distress.  He is quite groggy and dosing off often.  He says he did not sleep much last night. HEENT:  Normal.  Nares grossly intact.  Nonfocal. SKIN:   Warm and dry without lesions or masses. NECK:  Supple with JVP elevation to his jaw. LUNGS:  Respirations are unlabored with diminished breath sounds at left base, otherwise clear to auscultation. CARDIAC:  Regular S1 and S2 and S3.  Positive S4.  No murmurs. ABDOMEN:  Round, soft with general diffuse tenderness to palpation. Bowel sounds present x4. EXTREMITIES:  Warm, dry, pink with 1 to 2+ bilateral lower extremity edema.  Distal pulses are intact.  Chest x-ray shows new left retro-thoracic opacity may be due to pleural effusion and/or airspace disease.  Stable cardiomegaly.  EKG initially showed AFib rate of 123, normal axis, left bundle-branch block.  No  acute ST-T changes.  The patient is currently in sinus with no acute changes.  Hemoglobin 10.7, hematocrit 32.2, WBC 14.4, platelets 197. Sodium 137, potassium 5.3, chloride 105, CO2 of 22, BUN 50, creatinine 1.6, glucose 168, BNP 24,016, CK-MB 5.1, troponin-I less than 0.3, calcium 8.4, INR 1.23.  ASSESSMENT/PLAN: 1. Paroxysmal atrial fibrillation with rapid ventricular response.     The patient is now back in sinus rhythm after being reloaded with     amiodarone.  We will continue IV amiodarone at least overnight     prior to switching back to p.o.  We will have to obtain records     from [facility] in the morning to determine why the patient is not     currently anticoagulated.  We would plan to anticoagulate given his     elevated CHADS score and high risk for stroke.  I will plan to     initiate heparin, but first we will get a CT of his chest to     reevaluate the left-sided airspace disease versus pleural effusion     as he may require thoracentesis. 2. Acute on chronic systolic congestive heart failure/ischemic     cardiomyopathy.  The patient has significant volume overload.  We     will plan on IV diuresis.  Continue low-dose beta-blocker.  Note     that he is not currently on ACE inhibitor or ARB secondary to prior     ACE  inhibitor allergy and renal insufficiency.  Consider     hydralazine and nitrates as pressure allows. 3. Coronary artery disease.  The patient did have some chest pain in     the setting of his other symptoms.  We will cycle enzymes.  He is     recently status post coronary artery bypass grafting and we will     plan to continue aspirin and beta-blocker.  Of note, the patient     had been on statin when he left [place] last month, but does not     appear to be on one now.  We will resume. 4. Stage III chronic kidney disease with creatinine at baseline, BUN     is up slightly.  Follow closely with diuresis. 5. Diabetes mellitus.  We will add sliding scale insulin and continue     his outpatient dose of NovoLog.  Of note, the patient was in the     hospital the last month he was on Lantus 40 units at bedtime.  We     will follow his sugars and consider re-initiation as necessary. 6. Hypertension, stable. 7. Hyperlipidemia.  Resume statin therapy. 8. Left pleural effusion.  Followup CT and consider thoracentesis. 9. Mild hyperkalemia.  Follow with diuresis.  We will hold off on his     home scheduled doses of potassium replacement.     Nicolasa Ducking, ANP   ______________________________ [name]. [name], MD, [facility]    CB/MEDQ  D:  04/06/2011  T:  04/07/2011  Job:  [id number]  Electronically Signed by Nicolasa Ducking ANP on 04/13/2011 04:50:00 PM Electronically Signed by Charlton Haws MD [facility] on 04/19/2011 10:19:13 PM

## 2011-04-23 NOTE — Discharge Summary (Signed)
  [name], [name] NO.:  [id number]  MEDICAL RECORD NO.:  [id number]  LOCATION:  4712                         FACILITY:  [facility]  PHYSICIAN:  [name], MD, FACCDATE OF BIRTH:  1951/02/24  DATE OF ADMISSION:  04/06/2011 DATE OF DISCHARGE:  04/15/2011                              DISCHARGE SUMMARY   ADDENDUM:  Addendum to a previously dictated discharge summary job numbers [id number].  The patient was initially cleared for discharge on July the 18th, however [facility] rehabilitation facility did not have a bed at the time.  A bed has become available today and we do plan to discharge the patient to rehab today.  Of note, the patient's INR this morning has risen again and today is 3.52.  As a result, we will plan to hold Coumadin for the next 2 evenings with repeat INR tomorrow.     Nicolasa Ducking, ANP   ______________________________ [name], MD, [facility]    CB/MEDQ  D:  04/15/2011  T:  04/15/2011  Job:  [id number]  Electronically Signed by Nicolasa Ducking ANP on 04/19/2011 10:38:24 AM Electronically Signed by [name] MD [facility] on 04/23/2011 07:50:40 PM

## 2011-04-23 NOTE — Discharge Summary (Signed)
[name], EVERY NO.:  [id number]  MEDICAL RECORD NO.:  [id number]  LOCATION:  4712                         FACILITY:  [facility]  PHYSICIAN:  [name], MD, FACCDATE OF BIRTH:  Dec 09, 1950  DATE OF ADMISSION:  04/06/2011 DATE OF DISCHARGE:  04/14/2011                              DISCHARGE SUMMARY   PRIMARY CARDIOLOGIST:  [name], MD, [facility]  PRIMARY CARE PROVIDER:  [name], MD with the [facility].  CARDIOTHORACIC SURGEON:  Carmelo A. Milano, MD at [facility].  ELECTROPHYSIOLOGIST:  Kelly Splinter, MD, PhD at [facility].  DISCHARGE DIAGNOSIS:  Atrial fibrillation with rapid ventricular response.  SECONDARY DIAGNOSES: 1. Acute-on-chronic systolic congestive heart failure with an ejection     fraction of 25-30%. 2. Moderate left pleural effusion. 3. Coronary artery disease, status post recent coronary artery bypass     grafting x4 at [facility] on March 18, 2011,     placement of a left internal mammary artery to the left anterior     descending artery, and vein graft to the first diagonal, ramus     intermedius, and second obtuse marginal. 4. Stage III chronic kidney disease, baseline creatinine approximately     1.8. 5. Hypertension. 6. Hyperlipidemia. 7. Hyperkalemia. 8. Type 2 diabetes mellitus. 9. Ischemic cardiomyopathy. 10.Moderate pulmonary and arteriovenous hypertension. 11.Severe restrictive lung disease. 12.Remote tobacco abuse quitting on Feb 20, 2011. 13.Chronic obstructive pulmonary disease, on home oxygen. 14.Decondition. 15.Hypothyroidism. 16.Obstructive sleep apnea. 17.History of deep venous thrombosis. 18.Obesity. 19.Anxiety. 20.History of Fournier gangrene. 21.Osteoarthritis. 22.History of intermittent left bundle-branch block. 23.History of urinary retention. 24.Status post spinal fusion in 1987. 25.Status post tonsillectomy. 26.History of  normocytic anemia with normal esophagogastroduodenoscopy     in February 2012. 27.History of colon polyps.  ALLERGIES:  ACTOS and ALTACE.  PROCEDURES:  CT of the chest on April 06 1011, showing left greater than right pleural effusions and associated subtotal left lower lobe atelectasis.  Stable mediastinal adenopathy and right adrenal myelolipoma and pulmonary nodules.  Small calcified gallstones.  Intact median sternotomy without acute osseous findings.  Postsurgical changes with likely small amount of hemorrhage in the prevascular space.  HISTORY OF PRESENT ILLNESS:  A 60 year old male with the above complex problem list.  The patient recently had prolonged hospitalization at [name] on Feb 20, 2011 through March 12, 2011, during which time, he was found to have severe multivessel CAD with recurrent chest pain despite maximizing anginal therapy.  This is finally recommended that the patient undergo coronary artery bypass grafting, which was initially scheduled on March 12, 2011; however the patient's sister requested atransfer to [facility] for second opinion.  Following arrival at [facility], the patient underwent repeat right heart catheterization as well as pulmonary function testing and cardiac MRI.  Decision was made to pursue coronary artery bypass grafting which took place on March 18, 2011.  The patient received total of four bypasses as outlined above. Postoperatively, he did have a rise in his creatinine consistent with acute tubular necrosis and on postoperative day #3, developed atrial fibrillation with hypotension requiring cardioversion.  He was eventually placed on amiodarone and  subsequently discharged to [facility] here in [place] on March 25, 2011.  The patient was in his usual state of health until the evening of April 05, 2011, when he began to experience chest pain, dyspnea, and palpitations and on the morning of April 06, 2011, he was taken to the [facility] ED  where he was found to be an AFib with RVR.  In the ED, he was initially placed on diltiazem infusion, other than this was discontinued, given IV amiodarone bolus followed by infusion with successful conversion back to sinus rhythm.  The patient had marked volume overload on exam with a BNP of greater than 24,000.  He was admitted for further evaluation.  HOSPITAL COURSE:  The patient ruled out for MI.  He was placed on IV Lasix with an admission weight of 110.9 kg.  He diuresed well with stable renal function and for the admission, had a negative of approximately 7.6 liters with reduction in weight from 110.9 kg on admission to 104.327 kg today.  He has maintained sinus rhythm since admission and his IV amiodarone was converted to oral amiodarone by day #2.  His history was reviewed and decision was made to initiate long- term anticoagulation therapy in the form of Coumadin.  His INR today is supratherapeutic at 3.41 and for that reason, we will plan to hold his dose tonight as well as tomorrow and he was in 5 mg nightly beginning on Friday, April 16, 2011.  He will require repeat INR on April 19, 2011. The patient is felt to be stable for discharge back to [facility] today.  DISCHARGE LABS:  Hemoglobin 10.4, hematocrit 33.1, WBC 7.5, platelets 244.  INR 3.41.  Sodium 137, potassium 5.1, chloride 98, CO2 30, BUN 57, creatinine 2.12, glucose 121.  Total bilirubin 0.5, alkaline phosphatase 171, AST 20, ALT 51, total protein 6.3, albumin 2.7, calcium 8.9, and magnesium 2.6.  CK 49, MB 3.3, troponin-I less than 0.30.  BNP [id number] on April 07, 2011.  TSH 10.757.  DISPOSITION:  The patient will be discharged to [facility] today in good condition.  FOLLOWUP PLANS AND APPOINTMENT:  The patient will require a basic metabolic panel and PT/INR on April 19, 2011, which can be performed at [facility].  He will follow up with Tereso Newcomer, PA at [facility] on April 29, 2011 at  10 a.m.  Follow up with Dr. [name] as previously scheduled.  We will reschedule his previously scheduled appointment with Dr. Demetrios Loll at [facility] within the next few weeks. He will follow up with Dr. [name] on June 15, 2011 at 11:30 a.m.  He has previously scheduled follow up with Dr. Cyndy Freeze, an Electrophysiology at [facility] for June 22, 2011 at 11:15 a.m.  DISCHARGE MEDICATIONS: 1. Amiodarone 200 mg daily. 2. Coumadin 5 mg nightly to begin on April 16, 2011. 3. Lasix 80 mg b.i.d. 4. Metoprolol succinate 25 mg half a tablet b.i.d. 5. Aspirin 81 mg daily. 6. Levothyroxine 75 mcg daily. 7. Nitroglycerin 0.4 mg sublingual p.r.n. chest pain. 8. Rosuvastatin 40 mg nightly. 9. Acetaminophen 325 mg two tablets q.4 h. p.r.n. 10.Albuterol 2.5 mg q.i.d. p.r.n. 11.Alprazolam 0.25 mg b.i.d. p.r.n. 12.Doxazosin 1 mg daily. 13.NovoLog 3 units at breakfast and lunch and 5 units at dinner. 14.Ipratropium bromide 0.2 mg/mL q.i.d. 15.Magnesium hydroxide 30 mL as needed. 16.Tamsulosin 0.4 mg daily.  OUTSTANDING LABORATORY STUDIES:  Follow up PT/INR and basic metabolic panel on April 19, 2011.  DURATION OF DISCHARGE ENCOUNTER:  Sixty minutes including physician time.     Nicolasa Ducking, ANP   ______________________________ [name], MD, [facility]    CB/MEDQ  D:  04/14/2011  T:  04/15/2011  Job:  [id number]  cc:   [name], MD Carmelo A. Romona Curls, MD Kelly Splinter, MD, PhD  Electronically Signed by Nicolasa Ducking ANP on 04/19/2011 10:38:20 AM Electronically Signed by [name] MD [facility] on 04/23/2011 07:50:35 PM

## 2011-04-26 ENCOUNTER — Telehealth: Payer: Self-pay | Admitting: Family Medicine
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
## 2011-04-26 NOTE — Telephone Encounter (Signed)
Forward to [facility] for review

## 2011-04-29 ENCOUNTER — Encounter: Payer: Medicare Other | Admitting: Physician Assistant

## 2011-05-02 ENCOUNTER — Inpatient Hospital Stay (HOSPITAL_COMMUNITY)
Admission: EM | Admit: 2011-05-02 | Discharge: 2011-05-04 | DRG: 292 | Disposition: A | Discharge status: Other Acute Inpatient Hospital | Payer: Medicare Other | Attending: Family Medicine | Admitting: Family Medicine

## 2011-05-02 ENCOUNTER — Emergency Department (HOSPITAL_COMMUNITY): Payer: Medicare Other

## 2011-05-02 ENCOUNTER — Inpatient Hospital Stay (HOSPITAL_COMMUNITY): Payer: Medicare Other

## 2011-05-02 ENCOUNTER — Encounter: Payer: Self-pay | Admitting: Emergency Medicine

## 2011-05-02 DIAGNOSIS — Z7901 Long term (current) use of anticoagulants: Secondary | ICD-10-CM

## 2011-05-02 DIAGNOSIS — Z794 Long term (current) use of insulin: Secondary | ICD-10-CM

## 2011-05-02 DIAGNOSIS — J4489 Other specified chronic obstructive pulmonary disease: Secondary | ICD-10-CM | POA: Diagnosis present

## 2011-05-02 DIAGNOSIS — J449 Chronic obstructive pulmonary disease, unspecified: Secondary | ICD-10-CM | POA: Diagnosis present

## 2011-05-02 DIAGNOSIS — E119 Type 2 diabetes mellitus without complications: Secondary | ICD-10-CM | POA: Diagnosis present

## 2011-05-02 DIAGNOSIS — E039 Hypothyroidism, unspecified: Secondary | ICD-10-CM | POA: Diagnosis present

## 2011-05-02 DIAGNOSIS — N183 Chronic kidney disease, stage 3 unspecified: Secondary | ICD-10-CM | POA: Diagnosis present

## 2011-05-02 DIAGNOSIS — Z7982 Long term (current) use of aspirin: Secondary | ICD-10-CM

## 2011-05-02 DIAGNOSIS — R7881 Bacteremia: Secondary | ICD-10-CM | POA: Diagnosis present

## 2011-05-02 DIAGNOSIS — I4891 Unspecified atrial fibrillation: Secondary | ICD-10-CM | POA: Diagnosis present

## 2011-05-02 DIAGNOSIS — A4901 Methicillin susceptible Staphylococcus aureus infection, unspecified site: Secondary | ICD-10-CM | POA: Diagnosis present

## 2011-05-02 DIAGNOSIS — Z86718 Personal history of other venous thrombosis and embolism: Secondary | ICD-10-CM

## 2011-05-02 DIAGNOSIS — Z951 Presence of aortocoronary bypass graft: Secondary | ICD-10-CM

## 2011-05-02 DIAGNOSIS — M25519 Pain in unspecified shoulder: Secondary | ICD-10-CM | POA: Diagnosis present

## 2011-05-02 DIAGNOSIS — I129 Hypertensive chronic kidney disease with stage 1 through stage 4 chronic kidney disease, or unspecified chronic kidney disease: Secondary | ICD-10-CM | POA: Diagnosis present

## 2011-05-02 DIAGNOSIS — I2510 Atherosclerotic heart disease of native coronary artery without angina pectoris: Secondary | ICD-10-CM | POA: Diagnosis present

## 2011-05-02 DIAGNOSIS — M8610 Other acute osteomyelitis, unspecified site: Secondary | ICD-10-CM

## 2011-05-02 DIAGNOSIS — E108 Type 1 diabetes mellitus with unspecified complications: Secondary | ICD-10-CM

## 2011-05-02 DIAGNOSIS — I5021 Acute systolic (congestive) heart failure: Principal | ICD-10-CM | POA: Diagnosis present

## 2011-05-02 DIAGNOSIS — M7730 Calcaneal spur, unspecified foot: Secondary | ICD-10-CM

## 2011-05-02 DIAGNOSIS — I509 Heart failure, unspecified: Secondary | ICD-10-CM | POA: Diagnosis present

## 2011-05-02 DIAGNOSIS — I5023 Acute on chronic systolic (congestive) heart failure: Secondary | ICD-10-CM

## 2011-05-02 DIAGNOSIS — R809 Proteinuria, unspecified: Secondary | ICD-10-CM | POA: Diagnosis present

## 2011-05-02 LAB — COMPREHENSIVE METABOLIC PANEL
Albumin: 2.7 g/dL — ABNORMAL LOW (ref 3.5–5.2)
BUN: 63 mg/dL — ABNORMAL HIGH (ref 6–23)
Calcium: 9 mg/dL (ref 8.4–10.5)
Creatinine, Ser: 1.91 mg/dL — ABNORMAL HIGH (ref 0.50–1.35)
GFR calc Af Amer: 44 mL/min — ABNORMAL LOW (ref >60)
Glucose, Bld: 151 mg/dL — ABNORMAL HIGH (ref 70–99)
Potassium: 3.9 mEq/L (ref 3.5–5.1)
Total Protein: 6.7 g/dL (ref 6.0–8.3)

## 2011-05-02 LAB — GLUCOSE, CAPILLARY: Glucose-Capillary: 208 mg/dL — ABNORMAL HIGH (ref 70–99)

## 2011-05-02 LAB — CK TOTAL AND CKMB (NOT AT ARMC)
CK, MB: 4.3 ng/mL — ABNORMAL HIGH (ref 0.3–4.0)
Relative Index: 3.3 — ABNORMAL HIGH (ref 0.0–2.5)
Total CK: 129 U/L (ref 7–232)

## 2011-05-02 LAB — HEMOGLOBIN A1C: Mean Plasma Glucose: 146 mg/dL — ABNORMAL HIGH (ref <117)

## 2011-05-02 LAB — DIFFERENTIAL
Basophils Absolute: 0 10*3/uL (ref 0.0–0.1)
Eosinophils Absolute: 0.3 10*3/uL (ref 0.0–0.7)
Eosinophils Relative: 3 % (ref 0–5)
Lymphs Abs: 1.1 10*3/uL (ref 0.7–4.0)
Neutrophils Relative %: 78 % — ABNORMAL HIGH (ref 43–77)

## 2011-05-02 LAB — LIPID PANEL
HDL: 24 mg/dL — ABNORMAL LOW (ref >39)
LDL Cholesterol: 51 mg/dL (ref 0–99)
Triglycerides: 81 mg/dL (ref <150)

## 2011-05-02 LAB — CBC
MCV: 85 fL (ref 78.0–100.0)
Platelets: 214 10*3/uL (ref 150–400)
RBC: 3.27 MIL/uL — ABNORMAL LOW (ref 4.22–5.81)
RDW: 15.7 % — ABNORMAL HIGH (ref 11.5–15.5)
WBC: 11.4 10*3/uL — ABNORMAL HIGH (ref 4.0–10.5)

## 2011-05-02 LAB — PROTIME-INR
INR: 2.64 — ABNORMAL HIGH (ref 0.00–1.49)
Prothrombin Time: 28.6 seconds — ABNORMAL HIGH (ref 11.6–15.2)

## 2011-05-02 LAB — TSH: TSH: 7.107 u[IU]/mL — ABNORMAL HIGH (ref 0.350–4.500)

## 2011-05-02 LAB — CARDIAC PANEL(CRET KIN+CKTOT+MB+TROPI)
CK, MB: 4.7 ng/mL — ABNORMAL HIGH (ref 0.3–4.0)
CK, MB: 5.3 ng/mL — ABNORMAL HIGH (ref 0.3–4.0)
Relative Index: 3.9 — ABNORMAL HIGH (ref 0.0–2.5)
Relative Index: 4.7 — ABNORMAL HIGH (ref 0.0–2.5)
Total CK: 119 U/L (ref 7–232)
Total CK: 112 U/L (ref 7–232)

## 2011-05-02 LAB — POCT I-STAT, CHEM 8
Calcium, Ion: 1.11 mmol/L — ABNORMAL LOW (ref 1.12–1.32)
Chloride: 110 mEq/L (ref 96–112)
HCT: 27 % — ABNORMAL LOW (ref 39.0–52.0)
Hemoglobin: 9.2 g/dL — ABNORMAL LOW (ref 13.0–17.0)
Potassium: 4 mEq/L (ref 3.5–5.1)

## 2011-05-02 LAB — SEDIMENTATION RATE: Sed Rate: 115 mm/hr — ABNORMAL HIGH (ref 0–16)

## 2011-05-02 LAB — LACTIC ACID, PLASMA: Lactic Acid, Venous: 1.2 mmol/L (ref 0.5–2.2)
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
## 2011-05-02 NOTE — H&P (Signed)
[name] is an 60 y.o. male.   Chief Complaint: chest pain with shortness of breath HPI: [name] is a 60 year old man with multiple medical problems included CAD s/p CABG in 02/2011, systolic CHF with EF of 25-30%, Hx of DVT on chronic coumadin, HTN, HLD, DM, A fib, and COPD presenting with a 1 week history of worsening shortness of breath and substernal chest pain and right shoulder pain.  He was discharged from [facility] about 1 week ago at which time he discovered all his utilities had been turned of at his apartment.  He has been staying with a friend in [place].  Also had some difficulty getting his medications; states he has been taking all his meds except insulin which he ran out of.  On the morning of admission he was woken by sweats.  Also has noticed some nausea without vomiting and increasing fluid in his legs.  His chest pain was sub-sternal, exacerbated by exertion and similar to prior episodes; it was relieved by sublingual nitroglycerin. The pain in his right shoulder is new and quite severe; located across the top of the shoulder and collar bone, radiating into the upper arm.  Relieved by lying very still.  Right shoulder movement is limited by pain.  Denies any falls or trauma.  Does endorse a significant amount of anxiety.  Denies fever, chills, abdominal pain, vomiting.  Does endorse decreased appetite.  Past Medical History 1. CAD s/p CABG in 02/2011 2. Systolic heart failure, echo in 01/2011 showed EF of 25-30% with grade 2 diastolic dysfunction 3. A fib, has had episodes of RVR, most recently in 02/2011 4. Hx of DVT, on chronic coumadin 5. HTN 6. HLD 7. DM, insulin dependent 8. COPD 9. Hypothyroidism 10. OSA 11. Urinary retention   Past Surgical History  Procedure Date  . Spinal fusion   . Fournier gangrene 2006    Required surgical debridement  . Tonsilectomy, adenoidectomy, bilateral myringotomy and tubes 1956    Family History  Problem Relation Age of Onset   . Cancer Sister    Social History: Quit smoking in 01/2011.  Occasional alcohol use.  Denies drug use.  Currently living with friend is [name] as all utilities had been turned off at his apartment while he was hospitalized.  Per patient report, has gotten everything turned back on at this time.  Allergies:  Allergies  Allergen Reactions  . Graham Inhibitors     REACTION: cough  . Pioglitazone     REACTION: edema   Home Medications 1. prilosec 20mg  BID 2. Tramadol 50mg  1 tab q6hr PRN 3. Albuterol, 2 puffs q4hr PRN 4. Ativan 1mg  BID PRN 5. aricept 10mg  daily 6. pravachol 20mg  daily at bedtime 7. Metoprolol 50mg  BID 8. HCTZ 25mg  daily 9. Humulin 50/50, 3 units with breakfast and lunch, 5 at dinner 10. Albuterol neb, QID as needed 11. Tylenol 500mg , 2 tabs q4hr PRN 12. SL nitro 13. Coumadin 5mg , 1 tab daily at bedtime 14. Aspirin 81mg  daily   Results for orders placed during the hospital encounter of 05/02/11 (from the past 48 hour(s))  DIFFERENTIAL     Status: Abnormal   Collection Time   05/02/11  5:32 AM      Component Value Range Comment   Neutrophils Relative 78 (*) 43 - 77 (%)    Neutro Abs 8.9 (*) 1.7 - 7.7 (K/uL)    Lymphocytes Relative 10 (*) 12 - 46 (%)    Lymphs Abs 1.1  0.7 -  4.0 (K/uL)    Monocytes Relative 10  3 - 12 (%)    Monocytes Absolute 1.1 (*) 0.1 - 1.0 (K/uL)    Eosinophils Relative 3  0 - 5 (%)    Eosinophils Absolute 0.3  0.0 - 0.7 (K/uL)    Basophils Relative 0  0 - 1 (%)    Basophils Absolute 0.0  0.0 - 0.1 (K/uL)   CBC     Status: Abnormal   Collection Time   05/02/11  5:32 AM      Component Value Range Comment   WBC 11.4 (*) 4.0 - 10.5 (K/uL)    RBC 3.27 (*) 4.22 - 5.81 (MIL/uL)    Hemoglobin 8.9 (*) 13.0 - 17.0 (g/dL)    HCT 16.1 (*) 09.6 - 52.0 (%)    MCV 85.0  78.0 - 100.0 (fL)    MCH 27.2  26.0 - 34.0 (pg)    MCHC 32.0  30.0 - 36.0 (g/dL)    RDW 04.5 (*) 40.9 - 15.5 (%)    Platelets 214  150 - 400 (K/uL)   CK TOTAL AND CKMB      Status: Abnormal   Collection Time   05/02/11  5:32 AM      Component Value Range Comment   Total CK 129  7 - 232 (U/L)    CK, MB 4.3 (*) 0.3 - 4.0 (ng/mL)    Relative Index 3.3 (*) 0.0 - 2.5    TROPONIN I     Status: Normal   Collection Time   05/02/11  5:32 AM      Component Value Range Comment   Troponin I <0.30  <0.30 (ng/mL)   COMPREHENSIVE METABOLIC PANEL     Status: Abnormal   Collection Time   05/02/11  5:32 AM      Component Value Range Comment   Sodium 137  135 - 145 (mEq/L)    Potassium 3.9  3.5 - 5.1 (mEq/L)    Chloride 104  96 - 112 (mEq/L)    CO2 21  19 - 32 (mEq/L)    Glucose, Bld 151 (*) 70 - 99 (mg/dL)    BUN 63 (*) 6 - 23 (mg/dL)    Creatinine, Ser 8.11 (*) 0.50 - 1.35 (mg/dL)    Calcium 9.0  8.4 - 10.5 (mg/dL)    Total Protein 6.7  6.0 - 8.3 (g/dL)    Albumin 2.7 (*) 3.5 - 5.2 (g/dL)    AST 35  0 - 37 (U/L)    ALT 35  0 - 53 (U/L)    Alkaline Phosphatase 286 (*) 39 - 117 (U/L)    Total Bilirubin 0.5  0.3 - 1.2 (mg/dL)    GFR calc non Af Amer 36 (*) >60 (mL/min)    GFR calc Af Amer 44 (*) >60 (mL/min)   PROTIME-INR     Status: Abnormal   Collection Time   05/02/11  5:32 AM      Component Value Range Comment   Prothrombin Time 28.6 (*) 11.6 - 15.2 (seconds)    INR 2.64 (*) 0.00 - 1.49    LACTIC ACID, PLASMA     Status: Normal   Collection Time   05/02/11  5:34 AM      Component Value Range Comment   Lactic Acid, Venous 1.2  0.5 - 2.2 (mmol/L)   POCT I-STAT, CHEM 8     Status: Abnormal   Collection Time   05/02/11  5:42 AM      Component  Value Range Comment   Sodium 139  135 - 145 (mEq/L)    Potassium 4.0  3.5 - 5.1 (mEq/L)    Chloride 110  96 - 112 (mEq/L)    BUN 68 (*) 6 - 23 (mg/dL)    Creatinine, Ser 0.45 (*) 0.50 - 1.35 (mg/dL)    Glucose, Bld 409 (*) 70 - 99 (mg/dL)    Calcium, Ion 8.11 (*) 1.12 - 1.32 (mmol/L)    TCO2 19  0 - 100 (mmol/L)    Hemoglobin 9.2 (*) 13.0 - 17.0 (g/dL)    HCT 91.4 (*) 78.2 - 52.0 (%)    CXR: cardiomegaly, vascular  congestion  ECG: abnormal; no changes from prior  ROS Please see HPI, otherwise 12-point ROS negative   Physical Exam  Vitals: T 99.1; P 101; RR 35; BP 128/77; SaO2 87% on RA General: lying in gurney; taking rapid, shallow breaths; appears slightly anxious HEENT: AT/[place]; PERRL; moist mucous membranes Neck: no JVD CV: regular rate and rhythm; no murmurs Pulm: bibasilar crackles; no wheezes; good air movement Abd: +BS; soft, distended, non-tender; palpable liver Ext: 2-3+ pitting edema to knees bilaterally Neuro: alert and oriented; no obvious focal deficits MSK: right shoulder movement limited by pain; full range of motion in right hand; tender to palpation over clavicle and entire right shoulder  Assessment/Plan 60 year old man with multiple medical problems including CAD s/p CABG, A fib, systolic heart failure presenting with CP, shortness of breath, diaphoresis, fluid overload concerning for cardiac cause.  1. Chest Pain: Describes substernal pain that he has had in the past; relieved by sublingual nitroglycerin.  Given his history, there is concern for ACS.  His first set of enzymes in the ED were negative and his ECG showed no acute changes.  He is currently in sinus rhythm.  He was started on a nitro drip in the ED, we will continue this.  We will cycle his cardiac enzymes, monitor on telemetry and repeat and ECG in the morning.  Will also provide IV morphine 2mg  q3hours PRN for pain control.Differential also includes pulmonary and GI causes; however these are less likely with a CXR showing vascular congestion and no association of the pain with food.  2. Dyspnea: This is likely secondary to acute exacerbation on his heart failure.  He has signs of fluid overload, including bibasilar crackles and peripheral edema.  Also has vascular congestion on CXR.  No clear inciting event; however may be due to increased anxiety over living situation or missing medication doses.  Will give torsemide  20mg  IV TID for diuresis, and await med rec for current home regimen.  3. Right shoulder pain:  This is likely musculoskeletal in nature.  Given limited range of motion would consider rotator cuff injury vs impingement syndrome.  4. HTN: Currently well controlled on nitro drip.  We will restart his home meds slowly to prevent hypotension.  5. History of A fib: currently in sinus rhythm.  Therapeutic on coumadin.  Will continue coumadin.  6. CKD, stage III: new diagnosis; secondary to ATN from hypoperfusion during hospitalization at [facility].  Baseline is 1.7-1.9; currently at baseline.  We avoid nephrotoxic drugs and monitor with serial creatinines.  7. HLD: will check a fasting lipid panel and continue home meds.  8. Hypothyroidism: will check a TSH and continue home meds  9. DM II: will restart his home novolog and monitor.  FEN: heart healthy diet, NS to KVO PPX: coumadin Dispo: pending improvement and evaluation  BOOTH,  Jackilyn Umphlett 05/02/2011, 7:07 AM

## 2011-05-03 LAB — CBC
HCT: 28.9 % — ABNORMAL LOW (ref 39.0–52.0)
Hemoglobin: 9.3 g/dL — ABNORMAL LOW (ref 13.0–17.0)
MCV: 85.8 fL (ref 78.0–100.0)
RDW: 16 % — ABNORMAL HIGH (ref 11.5–15.5)
WBC: 11.3 10*3/uL — ABNORMAL HIGH (ref 4.0–10.5)

## 2011-05-03 LAB — GLUCOSE, CAPILLARY
Glucose-Capillary: 216 mg/dL — ABNORMAL HIGH (ref 70–99)
Glucose-Capillary: 192 mg/dL — ABNORMAL HIGH (ref 70–99)

## 2011-05-03 LAB — BASIC METABOLIC PANEL
BUN: 67 mg/dL — ABNORMAL HIGH (ref 6–23)
Chloride: 102 mEq/L (ref 96–112)
Creatinine, Ser: 1.92 mg/dL — ABNORMAL HIGH (ref 0.50–1.35)
GFR calc Af Amer: 43 mL/min — ABNORMAL LOW (ref >60)
Glucose, Bld: 201 mg/dL — ABNORMAL HIGH (ref 70–99)
Potassium: 4.9 mEq/L (ref 3.5–5.1)

## 2011-05-03 LAB — PROTIME-INR: INR: 2.85 — ABNORMAL HIGH (ref 0.00–1.49)

## 2011-05-04 ENCOUNTER — Inpatient Hospital Stay (HOSPITAL_COMMUNITY): Payer: Medicare Other

## 2011-05-04 LAB — GLUCOSE, CAPILLARY
Glucose-Capillary: 140 mg/dL — ABNORMAL HIGH (ref 70–99)
Glucose-Capillary: 129 mg/dL — ABNORMAL HIGH (ref 70–99)
Glucose-Capillary: 151 mg/dL — ABNORMAL HIGH (ref 70–99)

## 2011-05-04 LAB — WOUND CULTURE

## 2011-05-04 LAB — URINALYSIS, ROUTINE W REFLEX MICROSCOPIC
Ketones, ur: NEGATIVE mg/dL
Nitrite: NEGATIVE
Protein, ur: 30 mg/dL — AB
Urobilinogen, UA: 0.2 mg/dL (ref 0.0–1.0)
pH: 5 (ref 5.0–8.0)

## 2011-05-04 LAB — PROTEIN / CREATININE RATIO, URINE
Protein Creatinine Ratio: 0.88 — ABNORMAL HIGH (ref 0.00–0.15)
Total Protein, Urine: 60.2 mg/dL

## 2011-05-04 LAB — URINE MICROSCOPIC-ADD ON

## 2011-05-05 LAB — WOUND CULTURE

## 2011-05-05 LAB — CULTURE, BLOOD (ROUTINE X 2): Culture  Setup Time: 201208052102

## 2011-05-05 NOTE — Consult Note (Signed)
[name], [name]                 ACCOUNT NO.:  [id number]  MEDICAL RECORD NO.:  [id number]  LOCATION:  [facility]                         FACILITY:  [facility]  PHYSICIAN:  Mindi Slicker. Lowell Guitar, M.D.  DATE OF BIRTH:  [birth year]  DATE OF CONSULTATION:  05/03/2011 DATE OF DISCHARGE:                                CONSULTATION   I was asked by Dr. Zachery Dauer to see this 60 year old male with refractory volume overload and renal failure.  The patient is status post coronary artery bypass grafting in June 2012 at [facility] and has known chronic kidney disease with serum creatinine of 1.84 mg/dL on March 10, 2011.  The patient apparently left the nursing home that he was attending approximately 1 week prior to admission and was unable to obtain some of his medications including diuretics.  He was admitted to [facility] yesterday with volume overload.  The patient was treated with 40 mg of furosemide on May 02, 2011, without significant diuresis in fact he has gained almost kilogram and renal consultation was requested to assist with management.  PAST HISTORY:  Remarkable for diabetes, status post laser therapy to eyes, history of coronary artery disease status post coronary artery bypass grafting, history of low ejection fraction 25-30%, history of atrial fibrillation, history of DVT requiring chronic anticoagulation with oral Coumadin, hypertension, dyslipidemia, COPD, hypothyroidism, obstructive sleep apnea, history of urinary retention, status post tonsillectomy, status post spinal fusion, history of Fournier gangrene, history of bilateral ear tubes.  SOCIAL HISTORY:  The patient associated situation is difficult, being evaluated for repeat nursing home placement, which he is refusing, he was just discharged from the nursing home.  Allergic to ACE INHIBITOR, was causing cough, and also PIOGLITAZONE causes edema.  CURRENT MEDICATIONS:  Aspirin, Aricept, Lasix,  Humalog, morphine, Protonix, Zosyn, Zocor, vancomycin, warfarin.  REVIEW OF SYSTEMS:  He has some neck pain with turning of his neck.  PHYSICAL EXAMINATION:  VITAL SIGNS:  Blood pressure 111/74, he is afebrile, weight is 106.4 kg, was 105.6 kg yesterday. GENERAL:  He is a pleasant Caucasian male, alert, oriented and appropriate.  He has a long ponytail. HEENT:  Neck veins are not bulging. LUNGS:  Decreased breath sounds. HEART:  Regular rhythm and rate. ABDOMEN:  Obese. EXTREMITIES:  Pitting edema 2+, tight, tense bilaterally.  One to two positive blood cultures for Gram-positive cocci and clusters. Sodium 135, potassium 4.9, chloride 102, CO2 of 19, BUN 67, creatinine 0.92, hemoglobin 9.3, white blood count 11,300, platelets 225,000. Chest x-ray, cardiomegaly and increased vascular congestion.  ASSESSMENT: 1. Chronic kidney disease with azotemia. 2. Congestive heart failure/volume overload. 3. Proteinuria. 4. Diabetes mellitus. 5. Atherosclerotic cardiovascular disease status post CABG.  RECOMMENDATIONS: 1. Urinalysis, serum protein electrophoresis, urine protein     electrophoresis, renal ultrasound, and spot urine for protein and     creatinine as initial evaluation for renal disease. 2. Increase diuretics to furosemide 20 mg IV q.8 h. to optimize urine     output - to start.          ______________________________ Mindi Slicker. Lowell Guitar, M.D.     ACP/MEDQ  D:  05/03/2011  T:  05/04/2011  Job:  [id number]  Electronically Signed by Casimiro Needle M.D. on 05/05/2011 02:13:46 PM

## 2011-05-05 NOTE — Discharge Summary (Signed)
[name], [name] NO.:  [id number]  MEDICAL RECORD NO.:  [id number]  LOCATION:  [facility]                         FACILITY:  [facility]  PHYSICIAN:  Juan Ramp, MD        DATE OF BIRTH:  1951-07-24  DATE OF ADMISSION:  05/02/2011 DATE OF DISCHARGE:  05/04/2011                        DISCHARGE SUMMARY - REFERRING   TIME:  1:15 p.m.  DATE OF TRANSFER:  May 04, 2011  PCP:  Juan Mayhew, MD at [facility].  CARDIOLOGIST:  Juan Rotunda, MD, [facility]  CARDIOTHORACIC SURGEON:  Juan A. Milano, MD at [facility].  CONSULTS:  During this hospitalization, [name]. Juan Guitar, MD with Nephrology.  REASON FOR ADMISSION:  Chest pain and shortness of breath.  DISCHARGE DIAGNOSES: 1. Bacteremia from surgical site infection. 2. Congestive heart failure exacerbation. 3. Right shoulder pain. 4. Hypertension. 5. History of atrial fibrillation and deep venous thrombosis, on     chronic Coumadin. 6. Chronic kidney disease stage III. 7. Hyperlipidemia. 8. Hypothyroidism. 9. Type 2 diabetes.  DISCHARGE MEDICATIONS:  Current hospital medications, 1. Acetaminophen 500 mg 2 tablets every 4 hours as needed. 2. Albuterol nebulizer 4 times daily as needed. 3. Albuterol inhaler 2 puffs every 4 hours as needed. 4. Aspirin 81 mg daily. 5. Donepezil 10 mg daily at bedtime. 6. Furosemide 120 mg IV every 8 hours. 7. Humalog 50/50, 3 units before breakfast and lunch, 5 units before     dinner. 8. Lorazepam 1 mg twice daily as needed. 9. Morphine 2 to 4 mg IV every 2 hours scheduled. 10.Nitroglycerin 0.4 mg sublingually as needed. 11.Zofran 8 mg IV every 8 hours scheduled. 12.Pantoprazole 40 mg p.o. twice daily. 13.Zosyn 3.375 grams IV every 8 hours. 14.Simvastatin 10 mg daily. 15.Tramadol 50 mg every 6 hours as needed. 16.Vancomycin 1.5 grams every 24 hours. 17.Warfarin 5 mg daily.  His home medications have been held and include the following, 1.  Sublingual nitroglycerin as needed. 2. Acetaminophen 500 mg 2 tablets every 4 hours as needed. 3. Aspirin 81 mg daily. 4. Albuterol nebulizer four times daily as needed. 5. Coumadin 5 mg daily at bedtime. 6. Humulin 50/50, 3 units before breakfast and lunch, 5 units before     dinner. 7. Hydrochlorothiazide 25 mg daily. 8. Metoprolol 50 mg twice daily. 9. Pravachol 20 mg daily at bedtime. 10.Aricept 10 mg daily. 11.Ativan 1 mg twice daily as needed. 12.Prilosec 20 mg twice daily. 13.Albuterol inhaler 2 puffs every 4 hours as needed. 14.Tramadol 50 mg every 6 hours as needed.  HOSPITAL COURSE:  [name] is a 60 year old man with coronary artery disease status post CABG in June 2012, history of DVTs on Coumadin, congestive heart failure, and hypertension, who presented to the [facility] Emergency Room with chest and right shoulder pain, dyspnea, and leaking from his surgical site.  He was discharged from [facility] 1 week prior to admission at which time he discovered that his utilities have been turned off at home.  Give his social difficulties, he was unable to fill all these prescriptions and was without some of his medications for the week prior to admission. 1. Bacteremia from surgical  site infection.  On the afternoon of     admission, the patient complained of leaking from a surgical site.     The surgical scar was examined and noted to have yellow pus like     drainage from the superior aspect of this scar.  This was cultured     as well as obtaining two blood cultures.  He was started on     vancomycin and Zosyn.  The wound culture grew MSSA and the blood     cultures were both growing gram-positive cocci in clusters within     24 hours.  His blood pressure has been stable in the 100-120 range     systolic with no medications.  He has been afebrile, however, does     have intermittent chills and diaphoresis as well as some nausea and     vomiting.  An MRI  showed a sinus tract in the superficial wound     tracking to the sternomanubrial joint with no definitive     osteomyelitis. 2. CHF exacerbation.  The patient had signs of fluid overload on     admission with dyspnea, bibasilar crackles, and peripheral edema as     well as a chest x-ray that showed vascular congestion.  BNP was     21,775.  He was started on Lasix 40 mg IV t.i.d. with minimal urine     output with his new diagnosis of CKD stage III.  Nephrology was     consulted for diuresis recommendations.  They started him on Lasix     120 mg IV three times daily.  At the time of transfer, he continued     to have some dyspnea and basilar crackles as well as edema;     however, he is stable on 2 L of oxygen by nasal cannula. 3. Chest pain and right shoulder pain.  On admission, the patient     complained of severe substernal chest pain and right shoulder pain.     His cardiac enzymes were cycled and negative x3.  An EKG showed no     acute changes.  Further evaluation revealed, there is extremely     tender to palpation over the right sternoclavicular joint as well     as diffusely over the right shoulder.  The pain is adequately     controlled with 2-4 mg of IV morphine scheduled every 2 hours.     This is currently thought to be secondary to his surgical wound     infection with possible musculoskeletal components as well. 4. Hypertension.  The patient reported that he is typically     hypertensive and is on metoprolol and hydrochlorothiazide at home.     His blood pressure has remained in the 100-120 range systolic with     no medications. 5. History of AFib and DVTs.  He is on chronic Coumadin therapy and he     was therapeutic on admission and we continued his home dose.  On     the morning of transfer, his INR was supratherapeutic at 4.3 with     plans to hold his evening dose. 6. Chronic kidney disease stage III.  This is a new diagnosis per the     patient's report with a  baseline creatinine of 1.8 to 1.9.  His     creatinine has remained stable at 1.9 during this admission.  Renal     was consulted for diuresis management and also  recommended     additional labs to further evaluate renal function including an     urinalysis, an urine protein electrophoresis, a spot urine protein-     to-creatinine ratio, and a renal ultrasound.  These are pending at     time of transfer. 7. Type 2 diabetes.  This is well controlled with a hemoglobin A1c of     6.7.  He was continued on his home insulin regimen of Humulin     50/50, three units before breakfast and lunch and 5 units before     dinner.  PERTINENT HOSPITAL LABS AND IMAGING:  Blood cultures, gram-positive cocci in clusters within 24 hours.  Wound culture methicillin-sensitive staph aureus.  MRI showed no definitive osteomyelitis with a sinus tract from the superficial wound towards the sternomanubrial joint.  CONDITION AT TRANSFER:  Stable.  PENDING TESTS:  Urinary studies, urine protein electrophoresis, renal ultrasound, and final blood cultures.  DISPOSITION:  Transfer to [facility].    ______________________________ Despina Hick, MD   ______________________________ Juan Ramp, MD    EB/MEDQ  D:  05/04/2011  T:  05/04/2011  Job:  [id number] cc:   Juan Mayhew, MD Juan Rotunda, MD, [facility] [name]. [name], M.D. Juan A. Romona Curls, MD  Electronically Signed by Despina Hick MD on 05/04/2011 06:20:54 PM Electronically Signed by Denny Levy MD on 05/05/2011 03:49:56 PM

## 2011-05-06 LAB — UIFE/LIGHT CHAINS/TP QN, 24-HR UR
Albumin, U: DETECTED
Alpha 1, Urine: DETECTED — AB
Gamma Globulin, Urine: DETECTED — AB

## 2011-05-13 NOTE — H&P (Signed)
NAMEKAMILO, OCH                 ACCOUNT NO.:  [id number]  MEDICAL RECORD NO.:  [id number]  LOCATION:  [facility]                         FACILITY:  [facility]  PHYSICIAN:  Kendelle Schweers A. Sheffield Slider, M.D.    DATE OF BIRTH:  Feb 17, 1951  DATE OF ADMISSION:  05/02/2011 DATE OF DISCHARGE:                             HISTORY & PHYSICAL   PRIMARY CARE PHYSICIAN:  Ellin Mayhew, MD, at [facility].  CHIEF COMPLAINT:  Chest pain with shortness of breath.  HISTORY OF PRESENT ILLNESS:  Mr. Palinkas is a 60 year old man with multiple medical problems including coronary artery disease status post CABG in June 2012, systolic heart failure with an ejection fraction of 25-30%, DVT on chronic Coumadin, atrial fibrillation, hypertension, hyperlipidemia, diabetes, and COPD presenting with a 1-week history of worsening shortness of breath and substernal chest pain and right shoulder pain.  He was discharged from [facility] about 1 week ago at which time he discovered all of this utilities had been turned off at his apartment.  He had been staying with a friend in [place] in the meantime.  He has also had some difficulty getting his medications. States he had a half of his medicines.  One that he was missing was his insulin.  On the morning of admission, he was awoken by sweats with some nausea without vomiting and increasing fluid in his legs.  This chest pain was substernal, exacerbated by exertion, and similar to prior episode which was relieved by sublingual nitroglycerin.  The pain in his right shoulder is relatively new and quite severe.  It is located across the top of the shoulder and collar bone radiating into the upper arm, relieved by lying very still.  Right shoulder movement is limited by the pain.  He denies any falls or trauma.  Does endorse a significant amount of anxiety and decreased appetite.  Denies fever, chills, abdominal pain, or vomiting.  PAST MEDICAL HISTORY: 1. Coronary  artery disease, status post CABG in June 2012. 2. Systolic heart failure.  Echo in May 19147, showed an ejection     fraction of 25-30% and grade 2 diastolic dysfunction. 3. Atrial fibrillation.  He has had episodes of RVR most recently in     July 2012. 4. History of DVT, on chronic Coumadin. 5. Hypertension. 6. Hyperlipidemia. 7. Type 2 diabetes, insulin dependent. 8. COPD. 9. Hypothyroidism. 10.Obstructive sleep apnea. 11.Urinary retention.  PAST SURGICAL HISTORY:  Spinal fusion, gangrene that required surgical debridement, tonsillectomy, adenoidectomy, and bilateral myringotomy and tubes in 1956.  FAMILY HISTORY:  Significant for cancer in his sister.  SOCIAL HISTORY:  He quit smoking in May 2012.  Occasional alcohol use. Denies drug use.  He is currently living with a friend in [place] as all of the utilities had been turned off at his apartment while he was recently hospitalized.  Per the patient report, he had gotten everything turned back on at this time.  ALLERGIES:  ACE INHIBITORS cause of cough and PIOGLITAZONE causes edema.  CURRENT MEDICATIONS: 1. Prilosec 20 mg b.i.d. 2. Tramadol 50 mg 1 tablet every 6 hours as needed. 3. Albuterol 2 puffs every 4 hours as needed. 4. Ativan 1  mg b.i.d. as needed. 5. Aricept 10 mg daily. 6. Pravachol 20 mg daily at bedtime. 7. Metoprolol 50 mg b.i.d. 8. Hydrochlorothiazide 25 mg daily. 9. Humulin 50/50 three units at breakfast and lunch and p5 units at     dinner. 10.Albuterol nebulizer 4 times daily as needed. 11.Tylenol 500 mg 2 tablets every 4 hours as needed. 12.Sublingual nitroglycerin. 13.Coumadin 5 mg 1 tablet daily at bedtime. 14.Aspirin 81 mg daily.  REVIEW OF SYSTEMS:  Please see HPI, otherwise negative.  PHYSICAL EXAMINATION:  VITAL SIGNS:  Temperature 99.1, pulse 101, respiratory rate 35, blood pressure 128/77, and O2 saturation 87% on room air. GENERAL:  He is lying in the gurney, taking rapid shallow  breath, does appear slightly anxious. HEENT:  Atraumatic and normocephalic.  Pupils are equal, round, and reactive to light.  He has moist mucous membranes. NECK:  No JVP. CARDIOVASCULAR:  Regular rate and rhythm.  No murmurs. PULMONARY:  Bibasilar crackles.  No wheezes.  Good air movement. ABDOMEN:  Positive bowel sounds, soft, slightly distended, nontender. Does have a palpable liver. EXTREMITIES.  2 to 3+ pitting edema to the knees bilaterally. NEUROLOGIC:  He is alert and oriented.  No obvious focal deficits. MUSCULOSKELETAL:  Right shoulder movement is limited by pain.  He has full range of motion in the right hand and he is tender to palpation over the clavicle and entire right shoulder.  LABORATORY DATA AND STUDIES:  CBC showed a white count of 11.4, hemoglobin 8.9, and platelets 214.  A CMP shows sodium 137, potassium 3.9, chloride 104, bicarb 21, BUN 63, creatinine 1.91, and glucose 151. Liver panel within normal limits except for an alkaline phosphatase at 286 and albumin at 2.7.  Cardiac panel showed a total CK of 129, CK-MB 4.3, and troponin less than 0.3.  INR is therapeutic at 2.64.  Lactic acid 1.2.  He had a chest x-ray that showed cardiomegaly and vascular congestion and EKG showed no changes from prior exam, does have a left bundle-branch block.  ASSESSMENT/PLAN:  This is a 60 year old man with multiple medical problems including coronary artery disease, status post coronary artery bypass grafting, atrial fibrillation, and systolic heart failure who presents with chest pain, shortness of breath, diaphoresis, and fluid overload concerning for cardiac causes. 1. Chest pain.  He describes substernal chest pain that he has had in     the past that was relieved with sublingual nitroglycerin.  Given     his history, there is concern for acute coronary syndrome.  His     first set of enzymes in the ED were negative and his ECG showed no     acute changes.  He is currently  in sinus rhythm.  He was started on     a nitro drip in the ED and we have stopped this.  We will cycle his     cardiac enzymes, monitor on telemetry and repeat an ECG at 24     hours.  We will also provide IV morphine 2 mg every 3 hours as     needed for pain control and sublingual nitroglycerin as needed.     Differential also includes pulmonary and GI causes, however, these     are less likely with a chest x-ray showing vascular congestion and     no association of the pain with food. 2. Dyspnea.  This is likely secondary to acute exacerbation of his     heart failure.  He has signs of fluid overload including  bibasilar     crackles and peripheral edema, also vascular congestion on chest x-     ray.  There is no clear inciting event, however, it may be due to     increasing anxiety with the living situation or missing medication     doses.  We will give torsemide 20 mg IV t.i.d. for diuresis and     wait med rec for current home regimen.  We will also get a BNP on     him. 3. Right shoulder pain.  This is likely musculoskeletal in nature.     Given his limited range of motion, we would consider repair cuff     injury versus impingement syndrome.  We will continue his home     Tylenol dosing. 4. Hypertension.  This is currently well controlled on the nitro drip,     however, we are going to stop the nitro drip.  We will restart his     home meds slowly if needed. 5. History of atrial fibrillation.  He is currently in sinus rhythm.     He is therapeutic on his Coumadin.  We will continue his Coumadin     dosing. 6. Chronic kidney disease, stage III.  This is a new diagnosis of     secondary to acute tubular necrosis from hypoperfusion during     hospitalization at [facility].  Baseline creatinine is 1.7-1.9.  He is     currently at baseline.  We will avoid nephrotoxic drugs and monitor     with serial creatinines. 7. Hyperlipidemia.  We will check a fasting lipid panel and continue     his  home meds. 8. Hypothyroidism.  We will check a TSH and his home medications. 9. Diabetes, type 2.  We will restart his home Humulin and monitor. 10.Fluids, electrolytes, nutrition.  Heart-healthy diet.  Normal     saline to keep vein open. 11.Prophylaxis.  Coumadin. 12.Disposition.  Pending improvement and evaluation.    ______________________________ Despina Hick, MD   ______________________________ Arnette Norris. Sheffield Slider, M.D.    EB/MEDQ  D:  05/02/2011  T:  05/03/2011  Job:  [id number]  Electronically Signed by Denny Peon BOOTH MD on 05/03/2011 03:49:49 PM Electronically Signed by Zachery Dauer M.D. on 05/13/2011 04:20:14 PM

## 2011-06-15 ENCOUNTER — Encounter: Payer: Self-pay | Admitting: Cardiology

## 2011-06-15 ENCOUNTER — Ambulatory Visit (INDEPENDENT_AMBULATORY_CARE_PROVIDER_SITE_OTHER): Payer: Medicare Other | Admitting: Cardiology

## 2011-06-15 VITALS — BP 123/82 | HR 82 | Resp 18 | Ht 65.0 in | Wt 265.0 lb

## 2011-06-15 DIAGNOSIS — I2510 Atherosclerotic heart disease of native coronary artery without angina pectoris: Secondary | ICD-10-CM

## 2011-06-15 DIAGNOSIS — I5022 Chronic systolic (congestive) heart failure: Secondary | ICD-10-CM

## 2011-06-15 DIAGNOSIS — I10 Essential (primary) hypertension: Secondary | ICD-10-CM

## 2011-06-15 LAB — BASIC METABOLIC PANEL
CO2: 28 mEq/L (ref 19–32)
Calcium: 8.9 mg/dL (ref 8.4–10.5)
GFR: 51.18 mL/min — ABNORMAL LOW (ref >60.00)
Sodium: 142 mEq/L (ref 135–145)

## 2011-06-15 MED ORDER — METOLAZONE 5 MG PO TABS: 5.0000 mg | ORAL_TABLET | Freq: Every day | ORAL | Status: DC

## 2011-06-15 NOTE — Assessment & Plan Note (Signed)
At this point he is very volume overloaded.  I will add Zaroxolyn 5 mg daily for the next three days.  I will check a BMET today and again on Friday when he returns to see Tereso Newcomer in our office.  He might need to be hospitalized.  He does have renal insufficiency.  We will watch this closely.  He will keep his feet elevated, reduce his fluid intake.

## 2011-06-15 NOTE — Assessment & Plan Note (Signed)
The blood pressure is at target. No change in medications is indicated. We will continue with therapeutic lifestyle changes (TLC).  

## 2011-06-15 NOTE — Patient Instructions (Addendum)
Please take Zaroxolyn 5 mg a day for three days Continue all other medications as listed  Have blood work today and on Friday See Tereso Newcomer on Friday at 11:30 am.

## 2011-06-15 NOTE — Progress Notes (Signed)
HPI Mr. Bring presents for evaluation after a very difficult several months. He was hospitalized with intractable angina. After long discussion it was decided to go ahead with high risk CABG. At the last minute his sister demanded that he be transferred to [facility]. He did have bypass and was transferred back to rehabilitation as he has profound weakness. He has an ischemic cardiomyopathy. He was rehospitalized with volume overload. Following this he he had trouble with his living arrangements and was staying with a friend. Home health nursing not locate him. He he finally developed chest discomfort and was found to have sternal wound infection requiring treatment at [facility]. He still has 2 Jackson-Pratt drains in. He is now back at rehabilitation. He has been doing mild walking.  However over the past few days he's had increasing edema. He has chronic dyspnea and is O2 dependent chronic lung disease. His dyspnea is slightly worse. He does have chest discomfort related to his sternal wound. He is not describing any new cough fevers or chills.  Allergies  Allergen Reactions  . Ace Inhibitors     REACTION: cough  . Pioglitazone     REACTION: edema    Current Outpatient Prescriptions  Medication Sig Dispense Refill  . acetaminophen (TYLENOL) 325 MG tablet as needed.        Marland Kitchen albuterol (VENTOLIN HFA) 108 (90 BASE) MCG/ACT inhaler 1-2 puffs inhaled every 4 hours as needed for shortness of breath       . aspirin 81 MG EC tablet Take 81 mg by mouth daily.        Marland Kitchen CEFAZOLIN SODIUM IV Inject into the vein.        Marland Kitchen docusate sodium (COLACE) 100 MG capsule Take 100 mg by mouth daily.        . finasteride (PROSCAR) 5 MG tablet Take 5 mg by mouth daily.        . folic acid (FOLVITE) 1 MG tablet Take 1 mg by mouth daily.        . furosemide (LASIX) 80 MG tablet Take 80 mg by mouth 2 (two) times daily.        . Insulin Regular Human (HUMULIN R IJ) Inject as directed.        Marland Kitchen ipratropium (ATROVENT HFA) 17 MCG/ACT  inhaler Inhale 2 puffs into the lungs every 6 (six) hours.        Marland Kitchen levothyroxine (SYNTHROID, LEVOTHROID) 75 MCG tablet Take 75 mcg by mouth daily.        Marland Kitchen oxycodone (OXY-IR) 5 MG capsule Take 5 mg by mouth every 4 (four) hours as needed.        . ranitidine (ZANTAC) 150 MG tablet Take 150 mg by mouth at bedtime.        . sertraline (ZOLOFT) 100 MG tablet Take 100 mg by mouth daily.        Marland Kitchen spironolactone (ALDACTONE) 25 MG tablet Take 25 mg by mouth daily.        . Tamsulosin HCl (FLOMAX) 0.4 MG CAPS Take 0.4 mg by mouth daily.        Marland Kitchen DISCONTD: Tamsulosin HCl (FLOMAX) 0.4 MG CAPS Take 2 capsules (0.8 mg total) by mouth daily.  60 capsule  6  . DISCONTD: insulin glargine (LANTUS SOLOSTAR) 100 UNIT/ML injection Inject 45 Units into the skin at bedtime.  13.5 mL  11    Past Medical History  Diagnosis Date  . CAD (coronary artery disease) 2007    Stent placement R  Coronary artery 2007. Stenting LAD and cutting balloon of the diagonal (history of inferior MI).  He also has stenting to his LAD and a cutting balloon of the diagonal.  The most recent cath in June 2008 demonstrated distal obstructive LAD disease, but otherwise no acute disease.   EF 35%  . Ischemic cardiomyopathy   . Tobacco user 05/2010    PFT: no airflow obstruction.   . Hyperlipidemia   . Hypertension   . Diabetes mellitus   . OSA (obstructive sleep apnea) 05/2010    Sleep study AHI 54/hr with desat to 74%  . Personal history of DVT (deep vein thrombosis)     On chronic coumadin  . Obesity   . Anxiety   . Diabetic neuropathy   . Osteoarthritis   . Chronic respiratory failure   . Systolic CHF Echo 08/2010    EF 20-25%    Past Surgical History  Procedure Date  . Spinal fusion   . Fournier gangrene 2006    Required surgical debridement  . Tonsilectomy, adenoidectomy, bilateral myringotomy and tubes 1956    ROS: PHYSICAL EXAM BP 123/82  Pulse 82  Resp 18  Ht 5\' 5"  (1.651 m)  Wt 265 lb (120.203 kg)  BMI 44.10  kg/m2 PHYSICAL EXAM GEN:  No distress NECK:  No jugular venous distention at 90 degrees, waveform within normal limits, carotid upstroke brisk and symmetric, no bruits, no thyromegaly LYMPHATICS:  No cervical adenopathy LUNGS:  Clear to auscultation bilaterally BACK:  No CVA tenderness CHEST:  Sternal scar with indwelling JP drains HEART:  S1 and S2 within normal limits, no S3, no S4, no clicks, no rubs, no murmurs ABD:  Positive bowel sounds normal in frequency in pitch, no bruits, no rebound, no guarding, unable to assess midline mass or bruit with the patient seated. EXT:  2 plus pulses throughout, severe edema, no cyanosis no clubbing, pick line right arm SKIN:  No rashes no nodules NEURO:  Cranial nerves II through XII grossly intact, motor grossly intact throughout PSYCH:  Cognitively intact, oriented to person place and time  ASSESSMENT AND PLAN

## 2011-06-15 NOTE — Assessment & Plan Note (Signed)
He will continue with risk reduction. 

## 2011-06-18 ENCOUNTER — Ambulatory Visit: Payer: Medicare Other | Admitting: Physician Assistant

## 2011-06-18 ENCOUNTER — Other Ambulatory Visit: Payer: Medicare Other | Admitting: *Deleted

## 2011-06-22 ENCOUNTER — Ambulatory Visit: Payer: Medicare Other | Admitting: Physician Assistant

## 2011-06-24 ENCOUNTER — Inpatient Hospital Stay (HOSPITAL_COMMUNITY)
Admission: AD | Admit: 2011-06-24 | Discharge: 2011-06-29 | DRG: 292 | Disposition: A | Discharge status: Skilled Nursing Facility | Payer: Medicare Other | Source: Ambulatory Visit | Attending: Cardiology | Admitting: Cardiology

## 2011-06-24 ENCOUNTER — Inpatient Hospital Stay (HOSPITAL_COMMUNITY): Payer: Medicare Other

## 2011-06-24 ENCOUNTER — Encounter: Payer: Self-pay | Admitting: Physician Assistant

## 2011-06-24 ENCOUNTER — Ambulatory Visit (INDEPENDENT_AMBULATORY_CARE_PROVIDER_SITE_OTHER): Payer: Medicare Other | Admitting: Physician Assistant

## 2011-06-24 VITALS — BP 110/80 | HR 64 | Ht 69.0 in | Wt 255.0 lb

## 2011-06-24 DIAGNOSIS — E669 Obesity, unspecified: Secondary | ICD-10-CM | POA: Diagnosis present

## 2011-06-24 DIAGNOSIS — I4891 Unspecified atrial fibrillation: Secondary | ICD-10-CM | POA: Diagnosis present

## 2011-06-24 DIAGNOSIS — I5023 Acute on chronic systolic (congestive) heart failure: Secondary | ICD-10-CM | POA: Insufficient documentation

## 2011-06-24 DIAGNOSIS — F411 Generalized anxiety disorder: Secondary | ICD-10-CM | POA: Diagnosis present

## 2011-06-24 DIAGNOSIS — I129 Hypertensive chronic kidney disease with stage 1 through stage 4 chronic kidney disease, or unspecified chronic kidney disease: Secondary | ICD-10-CM | POA: Diagnosis present

## 2011-06-24 DIAGNOSIS — I2510 Atherosclerotic heart disease of native coronary artery without angina pectoris: Secondary | ICD-10-CM

## 2011-06-24 DIAGNOSIS — E118 Type 2 diabetes mellitus with unspecified complications: Secondary | ICD-10-CM

## 2011-06-24 DIAGNOSIS — E785 Hyperlipidemia, unspecified: Secondary | ICD-10-CM | POA: Diagnosis present

## 2011-06-24 DIAGNOSIS — N289 Disorder of kidney and ureter, unspecified: Secondary | ICD-10-CM | POA: Diagnosis present

## 2011-06-24 DIAGNOSIS — Z9981 Dependence on supplemental oxygen: Secondary | ICD-10-CM

## 2011-06-24 DIAGNOSIS — E039 Hypothyroidism, unspecified: Secondary | ICD-10-CM

## 2011-06-24 DIAGNOSIS — Z7901 Long term (current) use of anticoagulants: Secondary | ICD-10-CM

## 2011-06-24 DIAGNOSIS — N183 Chronic kidney disease, stage 3 unspecified: Secondary | ICD-10-CM | POA: Diagnosis present

## 2011-06-24 DIAGNOSIS — I10 Essential (primary) hypertension: Secondary | ICD-10-CM

## 2011-06-24 DIAGNOSIS — R339 Retention of urine, unspecified: Secondary | ICD-10-CM | POA: Diagnosis present

## 2011-06-24 DIAGNOSIS — I509 Heart failure, unspecified: Secondary | ICD-10-CM | POA: Diagnosis present

## 2011-06-24 DIAGNOSIS — Z7982 Long term (current) use of aspirin: Secondary | ICD-10-CM

## 2011-06-24 DIAGNOSIS — J449 Chronic obstructive pulmonary disease, unspecified: Secondary | ICD-10-CM | POA: Diagnosis present

## 2011-06-24 DIAGNOSIS — J9610 Chronic respiratory failure, unspecified whether with hypoxia or hypercapnia: Secondary | ICD-10-CM | POA: Diagnosis present

## 2011-06-24 DIAGNOSIS — I252 Old myocardial infarction: Secondary | ICD-10-CM

## 2011-06-24 DIAGNOSIS — Z981 Arthrodesis status: Secondary | ICD-10-CM

## 2011-06-24 DIAGNOSIS — Z9861 Coronary angioplasty status: Secondary | ICD-10-CM

## 2011-06-24 DIAGNOSIS — Z951 Presence of aortocoronary bypass graft: Secondary | ICD-10-CM

## 2011-06-24 DIAGNOSIS — J4489 Other specified chronic obstructive pulmonary disease: Secondary | ICD-10-CM | POA: Diagnosis present

## 2011-06-24 DIAGNOSIS — E1165 Type 2 diabetes mellitus with hyperglycemia: Secondary | ICD-10-CM

## 2011-06-24 DIAGNOSIS — M1990 Unspecified osteoarthritis, unspecified site: Secondary | ICD-10-CM | POA: Diagnosis present

## 2011-06-24 DIAGNOSIS — G4733 Obstructive sleep apnea (adult) (pediatric): Secondary | ICD-10-CM | POA: Diagnosis present

## 2011-06-24 DIAGNOSIS — Z794 Long term (current) use of insulin: Secondary | ICD-10-CM

## 2011-06-24 LAB — PROTIME-INR
INR: 1.19 (ref 0.00–1.49)
Prothrombin Time: 15.4 seconds — ABNORMAL HIGH (ref 11.6–15.2)

## 2011-06-24 LAB — TSH: TSH: 13.607 u[IU]/mL — ABNORMAL HIGH (ref 0.350–4.500)

## 2011-06-24 LAB — GLUCOSE, CAPILLARY
Glucose-Capillary: 170 mg/dL — ABNORMAL HIGH (ref 70–99)
Glucose-Capillary: 146 mg/dL — ABNORMAL HIGH (ref 70–99)
Glucose-Capillary: 161 mg/dL — ABNORMAL HIGH (ref 70–99)

## 2011-06-24 LAB — COMPREHENSIVE METABOLIC PANEL
Alkaline Phosphatase: 263 U/L — ABNORMAL HIGH (ref 39–117)
BUN: 74 mg/dL — ABNORMAL HIGH (ref 6–23)
CO2: 29 mEq/L (ref 19–32)
Chloride: 107 mEq/L (ref 96–112)
Creatinine, Ser: 1.59 mg/dL — ABNORMAL HIGH (ref 0.50–1.35)
GFR calc non Af Amer: 44 mL/min — ABNORMAL LOW (ref >60)
Potassium: 4.4 mEq/L (ref 3.5–5.1)
Total Bilirubin: 0.5 mg/dL (ref 0.3–1.2)

## 2011-06-24 LAB — CARDIAC PANEL(CRET KIN+CKTOT+MB+TROPI)
CK, MB: 5.3 ng/mL — ABNORMAL HIGH (ref 0.3–4.0)
Relative Index: INVALID (ref 0.0–2.5)
Total CK: 58 U/L (ref 7–232)

## 2011-06-24 LAB — CBC
Platelets: 147 10*3/uL — ABNORMAL LOW (ref 150–400)
RBC: 3.91 MIL/uL — ABNORMAL LOW (ref 4.22–5.81)
WBC: 5.9 10*3/uL (ref 4.0–10.5)

## 2011-06-24 LAB — APTT: aPTT: 35 seconds (ref 24–37)

## 2011-06-24 NOTE — Assessment & Plan Note (Signed)
He remains massively volume overloaded.  Unfortunately, I did not get a list of his medications today from [facility].  We have requested those.  I am uncertain as to if he is still taking metolazone or not.  In any event, he requires admission to the hospital for treatment of his heart failure.  We will place him on IV diuresis.  Consider consult to the advanced heart failure team.  Monitor renal function and potassium closely.  The patient was also interviewed and examined by Dr. Antoine Poche today.

## 2011-06-24 NOTE — Assessment & Plan Note (Addendum)
Stable.  JP drains are out of his wound.  Continue ASA.

## 2011-06-24 NOTE — Assessment & Plan Note (Signed)
Continue current regimen.  Cover with SSI.

## 2011-06-24 NOTE — Assessment & Plan Note (Signed)
Continue synthroid.

## 2011-06-24 NOTE — Progress Notes (Signed)
Addended byAlben Spittle, Lorin Picket T on: 06/24/2011 11:24 AM   Modules accepted: Orders

## 2011-06-24 NOTE — Assessment & Plan Note (Signed)
Controlled. Continue current regimen. 

## 2011-06-24 NOTE — Progress Notes (Addendum)
History of Present Illness: Primary Cardiologist:  Dr. [name]   [name] is a 60 y.o. male presents for follow up on CHF.  He has he has a history of prolonged hospitalization in May 2012 during which time he was found to have severe multivessel CAD and intractable angina.  Bypass was eventually recommended and set to take place at [facility].  However, the patient decided to be transferred to [facility] for a second opinion.  He eventually had bypass 03/18/11 at [facility] (LIMA-LAD, SVG-D1, RI, OM2).  He developed acute renal failure post operatively secondary to ATN as well postoperative atrial fibrillation and hypotension requiring cardioversion.  He was discharged to [facility] in [place].  He was readmitted 7/10-7/18 with acute on chronic systolic congestive heart failure in the setting of atrial fibrillation with rapid ventricular rate.  He was converted to sinus rhythm with IV amiodarone bolus and diuresed.  He was maintained on po amiodarone and Coumadin.  Unfortunately, he was readmitted 8/5-8/7 with a sternal wound infection that grew out methicillin sensitive staph aureus.  He was diuresed for acute on chronic systolic heart failure.  He was transferred back to [facility] for treatment of his sternal wound infection.  He had 2 Jackson-Pratt drains placed and was discharged back to rehabilitation.  Dr. Antoine Poche saw him 9/18 in follow up.  Unfortunately, he was volume overloaded again.  He placed him on Zaroxolyn 5 mg daily for 3 days with plans for follow up on 9/21.  However, he canceled that appointment and followed up today.  Labs on 9/18: K 4.9, BUN 50, creatinine 1.5.  There have been no follow up labs obtained since he started on Zaroxolyn.  He presents for followup.  He continues to have significant shortness of breath.  He is short of breath with minimal exertion as well as at rest.  He describes frequent episodes of PND.  He also notes orthopnea.  He sleeps on an incline in his hospital  bed with 2 extra pillows.  His edema is only slightly better.  His weight is down 10 pounds since his visit one week ago.  His chest is somewhat sore.  Her Jackson-Pratt drains are now out.  He did not need to follow up at [facility].  He denies syncope.  He denies palpitations.  His appetite is poor.  Past Medical History  Diagnosis Date  . CAD (coronary artery disease) 2007    RCA stent 2007; h/o stent to LAD and cutting balloon Dx, s/p CABG at [facility] 02/2011:  L-LAD, S-D1, S-RI, S-OM2  . Ischemic cardiomyopathy     Echo 5/12: Mild LVH, EF 25-30%, apical akinesis, inferoposterior akinesis, grade 2 diastolic dysfunction, mild MR, mild BAE, PASP 58  . Tobacco user 05/2010    PFT: no airflow obstruction.   . Hyperlipidemia   . Hypertension   . Diabetes mellitus   . OSA (obstructive sleep apnea) 05/2010    Sleep study AHI 54/hr with desat to 74%  . Personal history of DVT (deep vein thrombosis)     On chronic coumadin  . Obesity   . Anxiety   . Diabetic neuropathy   . Osteoarthritis   . Chronic respiratory failure   . Chronic systolic heart failure Echo 08/2010    EF 20-25%  . Atrial fibrillation     paroxysmal;  amiodarone; coumadin  . CKD (chronic kidney disease), stage III   . Pulmonary HTN   . Restrictive lung disease   . COPD (chronic obstructive  pulmonary disease)   . Hypothyroidism   . Anemia     Current Outpatient Prescriptions  Medication Sig Dispense Refill  . acetaminophen (TYLENOL) 325 MG tablet as needed.        Marland Kitchen albuterol (VENTOLIN HFA) 108 (90 BASE) MCG/ACT inhaler 1-2 puffs inhaled every 4 hours as needed for shortness of breath       . aspirin 81 MG EC tablet Take 81 mg by mouth daily.        Marland Kitchen docusate sodium (COLACE) 100 MG capsule Take 100 mg by mouth daily.        . finasteride (PROSCAR) 5 MG tablet Take 5 mg by mouth daily.        . folic acid (FOLVITE) 1 MG tablet Take 1 mg by mouth daily.        . furosemide (LASIX) 80 MG tablet Take by mouth 2 (two) times daily.        . Insulin Regular Human (HUMULIN R IJ) Inject as directed.        Marland Kitchen ipratropium (ATROVENT HFA) 17 MCG/ACT inhaler Inhale 2 puffs into the lungs every 6 (six) hours.        Marland Kitchen levothyroxine (SYNTHROID, LEVOTHROID) 75 MCG tablet Take 75 mcg by mouth daily.        Marland Kitchen oxycodone (OXY-IR) 5 MG capsule Take 5 mg by mouth every 4 (four) hours as needed.        . ranitidine (ZANTAC) 150 MG tablet Take 150 mg by mouth at bedtime.        . sertraline (ZOLOFT) 100 MG tablet Take 100 mg by mouth daily.        . Tamsulosin HCl (FLOMAX) 0.4 MG CAPS Take 0.4 mg by mouth daily.        Marland Kitchen DISCONTD: insulin glargine (LANTUS SOLOSTAR) 100 UNIT/ML injection Inject 45 Units into the skin at bedtime.  13.5 mL  11    Allergies: Allergies  Allergen Reactions  . Ace Inhibitors     REACTION: cough  . Pioglitazone     REACTION: edema    Social history:  Ex-smoker.  He is currently living in [facility].  ROS:  Please see the history of present illness.  He has loose stools.  He notes a mild cough and thinks he had brown sputum once.  Otherwise, all other systems reviewed and negative.   Vital Signs: BP 110/80  Pulse 64  Ht 5\' 9"  (1.753 m)  Wt 255 lb (115.667 kg)  BMI 37.66 kg/m2  PHYSICAL EXAM: Chronically ill-appearing male sitting in a wheelchair, in no acute distress HEENT: normal Neck: JVP to the angle of his jaw at 90 Cardiac:  normal S1, S2; RRR; no murmur Chest: Needing sternotomy scar well-healed without erythema; dressings at inferior aspect of his scar clean and dry Lungs:  Decreased breath sounds bilaterally, no wheezing, rhonchi or rales Abd: soft, nontender, no hepatomegaly Ext: 3+ bilateral edema up to his thighs Skin: warm and dry Neuro:  CNs 2-12 intact, no focal abnormalities noted Psych: Normal affect  EKG:   Sinus rhythm, heart rate 94, left bundle branch block  ASSESSMENT AND PLAN:   I reviewed the patient and examined him with Mr. Alben Spittle and I determined the  plan as listed below.  He continues to have dyspnea and significant edema despite increased diuretic at the last visit.  He has had no new chest pain, neck or arm pain.  He has had no new palpitations, presyncope or syncope.  Physical Exam:  (Agree with above) Lungs:  Decreased breath sounds Chest:  Healing sternotomy scar Abd:  Obese Cardiac:  Distant heart sounds, no S3, S4 Extremities:  Severe edema to the thighs.  A/P  As below admit for IV diuresis.  Plan to resume Coumadin.  Follow renal function closely.

## 2011-06-24 NOTE — Assessment & Plan Note (Addendum)
He was previously on amiodarone.  He is no longer on coumadin for unclear reasons.  He is not sure why.  I discussed with Dr. Antoine Poche.  Will restart in the hospital.  Also, I am not sure why he is no longer on amiodarone.  Apparently he was [facility] and these medicines were not continued.

## 2011-06-25 LAB — BASIC METABOLIC PANEL
Calcium: 9.5 mg/dL (ref 8.4–10.5)
GFR calc Af Amer: 50 mL/min — ABNORMAL LOW (ref >60)
GFR calc non Af Amer: 42 mL/min — ABNORMAL LOW (ref >60)
Glucose, Bld: 112 mg/dL — ABNORMAL HIGH (ref 70–99)
Potassium: 4.2 mEq/L (ref 3.5–5.1)
Sodium: 145 mEq/L (ref 135–145)

## 2011-06-25 LAB — GLUCOSE, CAPILLARY
Glucose-Capillary: 145 mg/dL — ABNORMAL HIGH (ref 70–99)
Glucose-Capillary: 139 mg/dL — ABNORMAL HIGH (ref 70–99)

## 2011-06-25 LAB — CARDIAC PANEL(CRET KIN+CKTOT+MB+TROPI)
CK, MB: 4.9 ng/mL — ABNORMAL HIGH (ref 0.3–4.0)
Total CK: 43 U/L (ref 7–232)
Troponin I: <0.3 ng/mL (ref <0.30)

## 2011-06-25 LAB — CBC
HCT: 34.3 % — ABNORMAL LOW (ref 39.0–52.0)
Hemoglobin: 10.7 g/dL — ABNORMAL LOW (ref 13.0–17.0)
RBC: 3.76 MIL/uL — ABNORMAL LOW (ref 4.22–5.81)
WBC: 5 10*3/uL (ref 4.0–10.5)

## 2011-06-25 LAB — HEPARIN LEVEL (UNFRACTIONATED): Heparin Unfractionated: 0.35 IU/mL (ref 0.30–0.70)

## 2011-06-26 LAB — CBC
Hemoglobin: 11.1 g/dL — ABNORMAL LOW (ref 13.0–17.0)
MCH: 28 pg (ref 26.0–34.0)
MCHC: 31 g/dL (ref 30.0–36.0)
Platelets: 150 10*3/uL (ref 150–400)

## 2011-06-26 LAB — BASIC METABOLIC PANEL
BUN: 75 mg/dL — ABNORMAL HIGH (ref 6–23)
Calcium: 9.7 mg/dL (ref 8.4–10.5)
Creatinine, Ser: 1.71 mg/dL — ABNORMAL HIGH (ref 0.50–1.35)
GFR calc Af Amer: 49 mL/min — ABNORMAL LOW (ref >60)
GFR calc non Af Amer: 41 mL/min — ABNORMAL LOW (ref >60)
Glucose, Bld: 136 mg/dL — ABNORMAL HIGH (ref 70–99)

## 2011-06-26 LAB — PROTIME-INR: Prothrombin Time: 17.1 seconds — ABNORMAL HIGH (ref 11.6–15.2)

## 2011-06-26 LAB — HEPARIN LEVEL (UNFRACTIONATED): Heparin Unfractionated: 0.36 IU/mL (ref 0.30–0.70)

## 2011-06-27 LAB — GLUCOSE, CAPILLARY
Glucose-Capillary: 154 mg/dL — ABNORMAL HIGH (ref 70–99)
Glucose-Capillary: 178 mg/dL — ABNORMAL HIGH (ref 70–99)

## 2011-06-27 LAB — CBC
HCT: 34.1 % — ABNORMAL LOW (ref 39.0–52.0)
Hemoglobin: 10.7 g/dL — ABNORMAL LOW (ref 13.0–17.0)
MCH: 28.3 pg (ref 26.0–34.0)
MCHC: 31.4 g/dL (ref 30.0–36.0)
MCV: 90.2 fL (ref 78.0–100.0)
RBC: 3.78 MIL/uL — ABNORMAL LOW (ref 4.22–5.81)

## 2011-06-27 LAB — BASIC METABOLIC PANEL
BUN: 75 mg/dL — ABNORMAL HIGH (ref 6–23)
CO2: 28 mEq/L (ref 19–32)
Calcium: 9.5 mg/dL (ref 8.4–10.5)
Creatinine, Ser: 1.91 mg/dL — ABNORMAL HIGH (ref 0.50–1.35)
Glucose, Bld: 171 mg/dL — ABNORMAL HIGH (ref 70–99)
Sodium: 144 mEq/L (ref 135–145)

## 2011-06-27 LAB — OCCULT BLOOD X 1 CARD TO LAB, STOOL: Fecal Occult Bld: NEGATIVE

## 2011-06-27 LAB — PROTIME-INR: INR: 2.05 — ABNORMAL HIGH (ref 0.00–1.49)

## 2011-06-28 DIAGNOSIS — I5023 Acute on chronic systolic (congestive) heart failure: Secondary | ICD-10-CM

## 2011-06-28 LAB — CBC
MCV: 91.5 fL (ref 78.0–100.0)
Platelets: 156 10*3/uL (ref 150–400)
RBC: 3.86 MIL/uL — ABNORMAL LOW (ref 4.22–5.81)
RDW: 18.4 % — ABNORMAL HIGH (ref 11.5–15.5)
WBC: 5.1 10*3/uL (ref 4.0–10.5)

## 2011-06-28 LAB — BASIC METABOLIC PANEL
CO2: 30 mEq/L (ref 19–32)
Calcium: 9.6 mg/dL (ref 8.4–10.5)
Creatinine, Ser: 1.83 mg/dL — ABNORMAL HIGH (ref 0.50–1.35)
GFR calc non Af Amer: 38 mL/min — ABNORMAL LOW (ref >90)
Glucose, Bld: 140 mg/dL — ABNORMAL HIGH (ref 70–99)
Sodium: 142 mEq/L (ref 135–145)

## 2011-06-28 LAB — HEPARIN LEVEL (UNFRACTIONATED): Heparin Unfractionated: <0.1 IU/mL — ABNORMAL LOW (ref 0.30–0.70)

## 2011-06-28 LAB — GLUCOSE, CAPILLARY: Glucose-Capillary: 140 mg/dL — ABNORMAL HIGH (ref 70–99)

## 2011-06-28 LAB — PROTIME-INR: Prothrombin Time: 32.6 seconds — ABNORMAL HIGH (ref 11.6–15.2)

## 2011-06-29 LAB — GLUCOSE, CAPILLARY
Glucose-Capillary: 195 mg/dL — ABNORMAL HIGH (ref 70–99)
Glucose-Capillary: 148 mg/dL — ABNORMAL HIGH (ref 70–99)

## 2011-06-29 LAB — CBC
HCT: 35.5 % — ABNORMAL LOW (ref 39.0–52.0)
MCH: 27.8 pg (ref 26.0–34.0)
MCV: 89.6 fL (ref 78.0–100.0)
RBC: 3.96 MIL/uL — ABNORMAL LOW (ref 4.22–5.81)
WBC: 5.4 10*3/uL (ref 4.0–10.5)

## 2011-06-29 LAB — BASIC METABOLIC PANEL
BUN: 74 mg/dL — ABNORMAL HIGH (ref 6–23)
CO2: 31 mEq/L (ref 19–32)
Glucose, Bld: 196 mg/dL — ABNORMAL HIGH (ref 70–99)
Potassium: 4.9 mEq/L (ref 3.5–5.1)
Sodium: 140 mEq/L (ref 135–145)

## 2011-07-05 ENCOUNTER — Other Ambulatory Visit: Payer: Self-pay

## 2011-07-05 ENCOUNTER — Encounter: Payer: Self-pay | Admitting: Internal Medicine

## 2011-07-05 ENCOUNTER — Telehealth (HOSPITAL_COMMUNITY): Payer: Self-pay | Admitting: Pharmacist

## 2011-07-05 ENCOUNTER — Ambulatory Visit (HOSPITAL_BASED_OUTPATIENT_CLINIC_OR_DEPARTMENT_OTHER)
Admission: AD | Admit: 2011-07-05 | Discharge: 2011-07-05 | Disposition: A | Discharge status: Home / Self Care | Payer: Medicare Other | Source: Ambulatory Visit | Attending: Internal Medicine | Admitting: Internal Medicine

## 2011-07-05 ENCOUNTER — Encounter (HOSPITAL_COMMUNITY): Payer: Self-pay

## 2011-07-05 DIAGNOSIS — I5023 Acute on chronic systolic (congestive) heart failure: Secondary | ICD-10-CM

## 2011-07-05 DIAGNOSIS — R197 Diarrhea, unspecified: Secondary | ICD-10-CM

## 2011-07-05 DIAGNOSIS — I5022 Chronic systolic (congestive) heart failure: Secondary | ICD-10-CM

## 2011-07-05 LAB — BASIC METABOLIC PANEL
Calcium: 9.2 mg/dL (ref 8.4–10.5)
GFR calc Af Amer: 24 mL/min — ABNORMAL LOW (ref >90)
GFR calc non Af Amer: 20 mL/min — ABNORMAL LOW (ref >90)
Glucose, Bld: 165 mg/dL — ABNORMAL HIGH (ref 70–99)
Potassium: 4.8 mEq/L (ref 3.5–5.1)
Sodium: 139 mEq/L (ref 135–145)

## 2011-07-05 NOTE — Assessment & Plan Note (Signed)
If persists will need SNF to check for C. Diff.

## 2011-07-05 NOTE — Progress Notes (Signed)
History of Present Illness: Primary Cardiologist:  Dr. [name]   [name] is a 60 y.o. male presents for follow up on CHF.  He has he has a history of prolonged hospitalization in May 2012 during which time he was found to have severe multivessel CAD and intractable angina.  Bypass was eventually recommended and set to take place at [facility].  However, the patient decided to be transferred to [facility] for a second opinion.  He eventually had bypass 03/18/11 at [facility] (LIMA-LAD, SVG-D1, RI, OM2).  He developed acute renal failure post operatively secondary to ATN as well postoperative atrial fibrillation and hypotension requiring cardioversion.  He was discharged to [facility] in [place].  He was readmitted 7/10-7/18 with acute on chronic systolic congestive heart failure in the setting of atrial fibrillation with rapid ventricular rate.  He was converted to sinus rhythm with IV amiodarone bolus and diuresed.  He was maintained on po amiodarone and Coumadin.  Unfortunately, he was readmitted 8/5-8/7 with a sternal wound infection that grew out methicillin sensitive staph aureus.  He was diuresed for acute on chronic systolic heart failure.  He was transferred back to [facility] for treatment of his sternal wound infection.  He had 2 Jackson-Pratt drains placed and was discharged back to rehabilitation.  Dr. Antoine Poche saw him 9/18 in follow up.  Unfortunately, he was volume overloaded again.  He placed him on Zaroxolyn 5 mg daily for 3 days with plans for follow up on 9/21.  However, he canceled that appointment and followed up today.  Labs on 9/18: K 4.9, BUN 50, creatinine 1.5.  There have been no follow up labs obtained since he started on Zaroxolyn.  Post hospitalization follow up.  10/2Acute on chronic systolic heart failure.  Ejection fraction of 25-30% by echocardiogram on Feb 24, 2011, with grade diastolic dysfunction. Discharge weight of 115.2 kg.  Discharge labs: NR 3.31.  WBC 5.4,  hemoglobin 11, hematocrit 35.5, and  platelet count 148.  Sodium 140, potassium 4.9, chloride 101, CO2 of 31,  glucose 196, BUN 74, and creatinine 1.99.    Complains of weakness. Over the last couple of days decreased urine output.  Requires assistance going to the bathroom.SOB at rest and on exertion.  Uses continuous oxygen. Sleeps with head of bed elevated. Dizziness when standing. Uses a walker while participating in physical therapy. Complains of diarrhea for the past 24 hours.    Past Medical History  Diagnosis Date  . CAD (coronary artery disease) 2007    RCA stent 2007; h/o stent to LAD and cutting balloon Dx, s/p CABG at [facility] 02/2011:  L-LAD, S-D1, S-RI, S-OM2  . Ischemic cardiomyopathy     Echo 5/12: Mild LVH, EF 25-30%, apical akinesis, inferoposterior akinesis, grade 2 diastolic dysfunction, mild MR, mild BAE, PASP 58  . Tobacco user 05/2010    PFT: no airflow obstruction.   . Hyperlipidemia   . Hypertension   . Diabetes mellitus   . OSA (obstructive sleep apnea) 05/2010    Sleep study AHI 54/hr with desat to 74%  . Personal history of DVT (deep vein thrombosis)     On chronic coumadin  . Obesity   . Anxiety   . Diabetic neuropathy   . Osteoarthritis   . Chronic respiratory failure   . Chronic systolic heart failure Echo 08/2010    EF 20-25%  . Atrial fibrillation     paroxysmal;  amiodarone; coumadin  . CKD (chronic kidney disease), stage III   .  Pulmonary HTN   . Restrictive lung disease   . COPD (chronic obstructive pulmonary disease)   . Hypothyroidism   . Anemia     Current Outpatient Prescriptions  Medication Sig Dispense Refill  . acetaminophen (TYLENOL) 325 MG tablet as needed.        Marland Kitchen albuterol (VENTOLIN HFA) 108 (90 BASE) MCG/ACT inhaler 1-2 puffs inhaled every 4 hours as needed for shortness of breath      . aspirin 81 MG EC tablet Take 81 mg by mouth daily.        Marland Kitchen docusate sodium (COLACE) 100 MG capsule Take 100 mg by mouth daily.        .  finasteride (PROSCAR) 5 MG tablet Take 5 mg by mouth daily.        . folic acid (FOLVITE) 1 MG tablet Take 1 mg by mouth daily.        . furosemide (LASIX) 80 MG tablet Take by mouth 2 (two) times daily.       . Insulin Regular Human (HUMULIN R IJ) Inject as directed.        Marland Kitchen ipratropium (ATROVENT HFA) 17 MCG/ACT inhaler Inhale 2 puffs into the lungs every 6 (six) hours.        Marland Kitchen levothyroxine (SYNTHROID, LEVOTHROID) 75 MCG tablet Take 75 mcg by mouth daily.        Marland Kitchen oxycodone (OXY-IR) 5 MG capsule Take 5 mg by mouth every 4 (four) hours as needed.        . ranitidine (ZANTAC) 150 MG tablet Take 150 mg by mouth at bedtime.        . sertraline (ZOLOFT) 100 MG tablet Take 100 mg by mouth daily.        . Tamsulosin HCl (FLOMAX) 0.4 MG CAPS Take 0.4 mg by mouth daily.        Marland Kitchen DISCONTD: insulin glargine (LANTUS SOLOSTAR) 100 UNIT/ML injection Inject 45 Units into the skin at bedtime.  13.5 mL  11    Allergies: Allergies  Allergen Reactions  . Ace Inhibitors     REACTION: cough  . Pioglitazone     REACTION: edema    Social history:  Ex-smoker.  He is currently living in [facility].  Vital Signs: BP 92/66  Pulse 75  Wt 222 lb 8 oz (100.925 kg)  SpO2 94%  PHYSICAL EXAM: Chronically ill-appearing male sitting in a wheelchair, in no acute distress HEENT: normal Neck: JVP to jaw Cardiac:  normal S1, S2; RRR; no murmur Lungs:  RML, RLL, and LLL crackles Abd: soft,obese,  nontender, no hepatomegaly Ext: 3+ bilateral edema  Skin: warm and dry Neuro:  CNs 2-12 intact, no focal abnormalities noted Psych: Normal affect  EKG: SR  LBBB  ASSESSMENT AND PLAN:

## 2011-07-05 NOTE — Patient Instructions (Signed)
Stop metolazone  Please wear compression when out of bed  Continue Lasix 80mg   BID  Call HF clinic if SBP is less than 80  Please measure and record weights and bring record to clinic visits  If diarrhea persists check  stool for C-Diff  Follow up HF Clinic Thursday July 08, 2011

## 2011-07-05 NOTE — Discharge Summary (Addendum)
[name], ELKO NO.:  [id number]  MEDICAL RECORD NO.:  [id number]  LOCATION:  3702                         FACILITY:  [facility]  PHYSICIAN:  [name], MD, FACCDATE OF BIRTH:  [birth year]  DATE OF ADMISSION:  06/24/2011 DATE OF DISCHARGE:  06/29/2011                              DISCHARGE SUMMARY   DISCHARGE DIAGNOSES: 1. Acute on chronic systolic heart failure.     a.     Ejection fraction of 25-30% by echocardiogram on Feb 24, 2011, with grade diastolic dysfunction.     b.     Discharge weight of 115.2 kg. 2. Coronary artery disease.     a.     History of percutaneous coronary intervention to the right      coronary artery in 2007.     b.     Non-ST-segment elevation myocardial infarction, status post      percutaneous coronary intervention to the left anterior descending      and diagonal in 2002.     c.     Recent coronary artery bypass grafting x4 at [facility] on June 31, 2012, with placement of left internal      mammary artery to the left anterior descending, vein graft to the      first diagonal, ramus intermedius to the second obtuse marginal. 3. Atrial fibrillation, restarted on Coumadin this admission.  Unclear     it had been previously discontinued. 4. History of deep vein thrombosis, on chronic Coumadin. 5. Bacteremia from the surgical site infection, methicillin-     susceptible Staphylococcus aureus, with hospitalization in August     2012.     a.     Was transferred to [facility] for treatment at that time, had two      JP drains placed which have been discontinued since. 6. Hypertension. 7. Hyperlipidemia. 8. Diabetes mellitus type 2. 9. Chronic obstructive pulmonary disease with chronic respiratory     failure, on home O2 with severe restrictive lung disease. 10.Chronic kidney disease stage III with history of acute tubular     necrosis postoperatively. 11.Hypothyroidism. 12.Obstructive sleep  apnea. 13.Urinary retention. 14.Obesity. 15.Anxiety. 16.Osteoarthritis. 17.History of intermittent left bundle-branch block. 18.History of Fournier gangrene. 19.History of normocytic anemia, abnormal esophagogastroduodenoscopy     in February 2012.  ADDITIONAL SURGICAL HISTORY:  Spinal fusion, gangrene requiring surgical debridement, tonsillectomy, adenoidectomy, bilateral myringotomy and tubes in 1956.  HOSPITAL COURSE:  Mr. Frankson is a 60 year old gentleman with a complex past medical history that includes CAD and ischemic cardiomyopathy with chronic systolic heart failure who presented to an office appointment on June 16, 2011, initially with Dr. Antoine Poche.  At that visit, he was felt to be volume overloaded and was placed on Zaroxolyn 5 mg for 3 days, was supposed to follow up on June 18, 2011.  However, he cancelled that appointment, did not follow up until June 24, 2011, where he was still noted to be quite shortness of breath and experiencing PND and orthopnea.  It was noted that he was no longer on Coumadin for unclear reasons as well.  He  was admitted to the hospital for diarrhea with IV Lasix and started on heparin to Coumadin. Aldactone was also continued.  Throughout his hospitalization, he required up-titration to 80 mg t.i.d. of IV Lasix and metolazone was added on June 27, 2011.  It was noted that he did have desaturation with ambulation by cardiac rehab on June 28, 2011, but by June 29, 2011, the patient was feeling better and was transitioned to p.o. Lasix. Dr. Antoine Poche has seen and examined him today and feels he is stable for discharge.  I have discussed the patient's morning labs with him in regards to his creatinine and potassium and at this time Dr. Antoine Poche would like to continue his current dose of spironolactone, keep him at 80 mg p.o. b.i.d. of Lasix and add 2.5 mg of Zaroxolyn on Monday, Wednesday, and Friday with close outpatient  followup.  DISCHARGE LABS:  INR 3.31.  WBC 5.4, hemoglobin 11, hematocrit 35.5, and platelet count 148.  Sodium 140, potassium 4.9, chloride 101, CO2 of 31, glucose 196, BUN 74, and creatinine 1.99.  Fecal occult blood testing was negative.  STUDIES:  Chest x-ray on June 24, 2011, showed interval sternotomy wire removal.  Right PICC line tip in SVC region.  Cardiomegaly with vascular congestion and bibasilar atelectasis.  No current CHF.  DISCHARGE MEDICATIONS: 1. Coumadin 4 mg daily.  This was the last dose of Pharmacy had     written to start the patient on.  However, the patient is     instructed not to start the medicine yet as his INR continues to be     supratherapeutic.  He will have his INR checked tomorrow to     determine further direction. 2. Coreg 3.125 mg b.i.d. 3. Zaroxolyn 2.5 mg on Monday, Wednesday, and Friday. 4. Albuterol inhaler 2 puffs inhaled q.i.d. p.r.n. 5. Atrovent 2 puffs inhaled q.i.d. p.r.n. shortness of breath. 6. Bisacodyl rectal 10 mg 1 suppository rectally daily as needed for     constipation. 7. Aspirin 81 mg every morning. 8. Flomax 0.4 mg every morning. 9. Furosemide 80 mg b.i.d.10.Humulin R 3 units at breakfast, 2 units at lunch, and 2 units at     dinner.  Hold if CBG is less than 150 or the patient eats less than     50% of meal. 11.Oxycodone 5 mg q.6 h. p.r.n. severe pain. 12.Proscar 5 mg daily. 13.Pro-Stat 64, 30 mL by mouth b.i.d. daily. 14.Saline laxative enema 1 enema rectally daily as needed for     constipation, not relieved by suppository. 15.Senokot 2 tablets b.i.d. p.r.n. constipation. 16.Spironolactone 25 mg every morning. 17.Synthroid 75 mg every morning. 18.Tylenol 325 mg 2 tablets q.6 h. p.r.n. pain. 19.Zantac 150 mg every morning. 20.Zoloft 100 mg every morning.  His milk of magnesia was discontinued secondary to his renal insufficiency. He is not on ACE/ARB secondary to renal insufficiency.  DISPOSITION:  Mr. Gales  will be discharged in stable condition back to [facility].  He is to follow a low-salt, 2-g sodium, 1500-mL fluid and restricted diet.  Nursing facility will be instructed to draw PT/INR on June 30, 2011, with results to [facility].  He is to elevate his feet and use compression stockings.  He will have a BMET drawn on July 01, 2011, with results to Dr. Antoine Poche sent. He is also to follow up with the CHF clinic at [facility] on July 05, 2011, at 9:30 a.m.  Duration of discharge  encounter greater than 30 minutes including physician and PA time.     Ronie Spies, P.A.C.   ______________________________ [name], MD, [facility]    DD/MEDQ  D:  06/29/2011  T:  06/29/2011  Job:  [id number]  cc:   CHF Clinic at [facility]  Electronically Signed by Ronie Spies  on 07/05/2011 09:35:32 AM Electronically Signed by [name] MD [facility] on 07/08/2011 01:37:24 PM

## 2011-07-05 NOTE — Telephone Encounter (Signed)
You are on an ACE.Marland KitchenMarland Kitchen

## 2011-07-05 NOTE — Assessment & Plan Note (Addendum)
NYHA III-IIIB.  Remains volume overloaded despite 30 pound weight loss. BP soft. I suspect volume may be coming off too quickly. Will stop metolazone. Continue Lasix 80 mg po BID.  Check BMET today. He is to notify SNF staff if he becomes dizzy. Gave explicit instructions for SNF to contact HF clinic if SBP less than 80 or if develops dizziness. Place TED hose. Will follow up Thursday. Time spent ~ 50 mins.  Patient seen and examined with Tonye Becket, NP. We discussed all aspects of the encounter. I agree with the assessment and plan as stated above.

## 2011-07-05 NOTE — Assessment & Plan Note (Signed)
NYHA III-IV. Volume status remains elevated.

## 2011-07-07 ENCOUNTER — Emergency Department (HOSPITAL_COMMUNITY)
Admission: EM | Admit: 2011-07-07 | Discharge: 2011-07-08 | Disposition: A | Discharge status: Home / Self Care | Payer: Medicare Other | Attending: Emergency Medicine | Admitting: Emergency Medicine

## 2011-07-07 DIAGNOSIS — R040 Epistaxis: Secondary | ICD-10-CM | POA: Insufficient documentation

## 2011-07-07 LAB — DIFFERENTIAL
Basophils Absolute: 0 10*3/uL (ref 0.0–0.1)
Basophils Relative: 0 % (ref 0–1)
Monocytes Absolute: 0.7 10*3/uL (ref 0.1–1.0)
Neutro Abs: 4.4 10*3/uL (ref 1.7–7.7)
Neutrophils Relative %: 70 % (ref 43–77)

## 2011-07-07 LAB — CBC
Hemoglobin: 11.8 g/dL — ABNORMAL LOW (ref 13.0–17.0)
MCHC: 33.1 g/dL (ref 30.0–36.0)
WBC: 6.3 10*3/uL (ref 4.0–10.5)

## 2011-07-08 ENCOUNTER — Encounter (HOSPITAL_COMMUNITY): Payer: Medicare Other

## 2011-07-12 ENCOUNTER — Ambulatory Visit (HOSPITAL_COMMUNITY)
Admission: RE | Admit: 2011-07-12 | Discharge: 2011-07-12 | Disposition: A | Discharge status: Home / Self Care | Payer: Medicare Other | Source: Ambulatory Visit | Attending: Internal Medicine | Admitting: Internal Medicine

## 2011-07-12 ENCOUNTER — Telehealth: Payer: Self-pay | Admitting: Internal Medicine

## 2011-07-12 VITALS — BP 92/68 | HR 76 | Wt 248.5 lb

## 2011-07-12 DIAGNOSIS — J9610 Chronic respiratory failure, unspecified whether with hypoxia or hypercapnia: Secondary | ICD-10-CM | POA: Insufficient documentation

## 2011-07-12 DIAGNOSIS — I5023 Acute on chronic systolic (congestive) heart failure: Secondary | ICD-10-CM

## 2011-07-12 DIAGNOSIS — F411 Generalized anxiety disorder: Secondary | ICD-10-CM | POA: Insufficient documentation

## 2011-07-12 DIAGNOSIS — E1149 Type 2 diabetes mellitus with other diabetic neurological complication: Secondary | ICD-10-CM | POA: Insufficient documentation

## 2011-07-12 DIAGNOSIS — Z86718 Personal history of other venous thrombosis and embolism: Secondary | ICD-10-CM | POA: Insufficient documentation

## 2011-07-12 DIAGNOSIS — E785 Hyperlipidemia, unspecified: Secondary | ICD-10-CM | POA: Insufficient documentation

## 2011-07-12 DIAGNOSIS — Z79899 Other long term (current) drug therapy: Secondary | ICD-10-CM | POA: Insufficient documentation

## 2011-07-12 DIAGNOSIS — H5000 Unspecified esotropia: Secondary | ICD-10-CM | POA: Insufficient documentation

## 2011-07-12 DIAGNOSIS — Z7901 Long term (current) use of anticoagulants: Secondary | ICD-10-CM | POA: Insufficient documentation

## 2011-07-12 DIAGNOSIS — E1142 Type 2 diabetes mellitus with diabetic polyneuropathy: Secondary | ICD-10-CM | POA: Insufficient documentation

## 2011-07-12 DIAGNOSIS — I509 Heart failure, unspecified: Secondary | ICD-10-CM | POA: Insufficient documentation

## 2011-07-12 DIAGNOSIS — I129 Hypertensive chronic kidney disease with stage 1 through stage 4 chronic kidney disease, or unspecified chronic kidney disease: Secondary | ICD-10-CM | POA: Insufficient documentation

## 2011-07-12 DIAGNOSIS — F17200 Nicotine dependence, unspecified, uncomplicated: Secondary | ICD-10-CM | POA: Insufficient documentation

## 2011-07-12 DIAGNOSIS — I2510 Atherosclerotic heart disease of native coronary artery without angina pectoris: Secondary | ICD-10-CM | POA: Insufficient documentation

## 2011-07-12 DIAGNOSIS — Z7982 Long term (current) use of aspirin: Secondary | ICD-10-CM | POA: Insufficient documentation

## 2011-07-12 DIAGNOSIS — J449 Chronic obstructive pulmonary disease, unspecified: Secondary | ICD-10-CM | POA: Insufficient documentation

## 2011-07-12 DIAGNOSIS — E039 Hypothyroidism, unspecified: Secondary | ICD-10-CM | POA: Insufficient documentation

## 2011-07-12 DIAGNOSIS — I4891 Unspecified atrial fibrillation: Secondary | ICD-10-CM | POA: Insufficient documentation

## 2011-07-12 DIAGNOSIS — G4733 Obstructive sleep apnea (adult) (pediatric): Secondary | ICD-10-CM | POA: Insufficient documentation

## 2011-07-12 DIAGNOSIS — I5022 Chronic systolic (congestive) heart failure: Secondary | ICD-10-CM | POA: Insufficient documentation

## 2011-07-12 DIAGNOSIS — J4489 Other specified chronic obstructive pulmonary disease: Secondary | ICD-10-CM | POA: Insufficient documentation

## 2011-07-12 DIAGNOSIS — N183 Chronic kidney disease, stage 3 unspecified: Secondary | ICD-10-CM | POA: Insufficient documentation

## 2011-07-12 DIAGNOSIS — Z951 Presence of aortocoronary bypass graft: Secondary | ICD-10-CM | POA: Insufficient documentation

## 2011-07-12 LAB — BASIC METABOLIC PANEL
Chloride: 103 mEq/L (ref 96–112)
GFR calc Af Amer: 12 mL/min — ABNORMAL LOW (ref >90)
GFR calc non Af Amer: 10 mL/min — ABNORMAL LOW (ref >90)
Glucose, Bld: 150 mg/dL — ABNORMAL HIGH (ref 70–99)
Potassium: 4.2 mEq/L (ref 3.5–5.1)
Sodium: 143 mEq/L (ref 135–145)

## 2011-07-12 NOTE — Telephone Encounter (Signed)
Labs back today with Cr 5.0. We called [facility] and patient has continued to receive lasix despite our telephone order to his nurse Edgardo Roys) on 10/11. I called the nursing supervisor at [facility] to discuss. They will hold all lasix and spiro. He will get 1L NS today. Remove fluid restriction. F/u BMET on Wed.

## 2011-07-12 NOTE — Progress Notes (Signed)
History of Present Illness: Primary Cardiologist:  Dr. [name]   [name] is a 59 y.o. male presents for follow up on CHF.  He has he has a history of prolonged hospitalization in May 2012 during which time he was found to have severe multivessel CAD and intractable angina.  Bypass was eventually recommended and set to take place at [facility].  However, the patient decided to be transferred to [facility] for a second opinion.  He eventually had bypass 03/18/11 at [facility] (LIMA-LAD, SVG-D1, RI, OM2).  He developed acute renal failure post operatively secondary to ATN as well postoperative atrial fibrillation and hypotension requiring cardioversion.  He was discharged to [facility] in [place].  He was readmitted 7/10-7/18 with acute on chronic systolic congestive heart failure in the setting of atrial fibrillation with rapid ventricular rate.  He was converted to sinus rhythm with IV amiodarone bolus and diuresed.  He was maintained on po amiodarone and Coumadin.  Unfortunately, he was readmitted 8/5-8/7 with a sternal wound infection that grew out methicillin sensitive staph aureus.  He was diuresed for acute on chronic systolic heart failure.  He was transferred back to [facility] for treatment of his sternal wound infection.  He had 2 Jackson-Pratt drains placed and was discharged back to rehabilitation.  Dr. Antoine Poche saw him 9/18 in follow up.  Unfortunately, he was volume overloaded again.  He placed him on Zaroxolyn 5 mg daily for 3 days with plans for follow up on 9/21.  However, he canceled that appointment and followed up today.  Labs on 9/18: K 4.9, BUN 50, creatinine 1.5.  There have been no follow up labs obtained since he started on Zaroxolyn.  Post hospitalization follow up.  10/2Acute on chronic systolic heart failure.  Ejection fraction of 25-30% by echocardiogram on Feb 24, 2011, with grade diastolic dysfunction. Discharge weight of 115.2 kg.  Discharge labs: NR 3.31.  WBC 5.4,  hemoglobin 11, hematocrit 35.5, and  platelet count 148.  Sodium 140, potassium 4.9, chloride 101, CO2 of 31,  glucose 196, BUN 74, and creatinine 1.99.  Metolazone held last visit due to orthostatic symptoms despite volume overload.    He returns for close follow up today.  Cr 3.06 and BUN 113 on 10/8.  Orders to hold the pt's lasix were called to [facility] but it does not appear that this was completed, pt is unsure.  He continues to c/o weakness.  UOP remains minimal.  Continues to work with physical therapy but denies dizziness.  Continues to have dyspnea at rest and on exertion.  Using continuous oxygen.  He chronically sleeps with the head of his bed elevated.  No PND.    Weights from [place] fluctuating from 238-252.  Per pt SBP 88-92.  Pt up 26 lbs since last visit but do not believe last weight was correct.   Has an appointment with Dr. Sela Hilding at [facility] next month for possible ICD.     Past Medical History  Diagnosis Date  . CAD (coronary artery disease) 2007    RCA stent 2007; h/o stent to LAD and cutting balloon Dx, s/p CABG at [facility] 02/2011:  L-LAD, S-D1, S-RI, S-OM2  . Ischemic cardiomyopathy     Echo 5/12: Mild LVH, EF 25-30%, apical akinesis, inferoposterior akinesis, grade 2 diastolic dysfunction, mild MR, mild BAE, PASP 58  . Tobacco user 05/2010    PFT: no airflow obstruction.   . Hyperlipidemia   . Hypertension   . Diabetes mellitus   .  OSA (obstructive sleep apnea) 05/2010    Sleep study AHI 54/hr with desat to 74%  . Personal history of DVT (deep vein thrombosis)     On chronic coumadin  . Obesity   . Anxiety   . Diabetic neuropathy   . Osteoarthritis   . Chronic respiratory failure   . Chronic systolic heart failure Echo 08/2010    EF 20-25%  . Atrial fibrillation     paroxysmal;  amiodarone; coumadin  . CKD (chronic kidney disease), stage III   . Pulmonary HTN   . Restrictive lung disease   . COPD (chronic obstructive pulmonary disease)   . Hypothyroidism   .  Anemia     Current Outpatient Prescriptions  Medication Sig Dispense Refill  . acetaminophen (TYLENOL) 325 MG tablet Take 650 mg by mouth every 6 (six) hours as needed.       Marland Kitchen albuterol (VENTOLIN HFA) 108 (90 BASE) MCG/ACT inhaler 1-2 puffs inhaled every 4 hours as needed for shortness of breath      . aspirin 81 MG EC tablet Take 81 mg by mouth daily.        . bisacodyl (DULCOLAX) 10 MG suppository Place 10 mg rectally as needed.        . carvedilol (COREG) 3.125 MG tablet Take 3.125 mg by mouth 2 (two) times daily with a meal.        . docusate sodium (COLACE) 100 MG capsule Take 100 mg by mouth daily.        . finasteride (PROSCAR) 5 MG tablet Take 5 mg by mouth daily.        . folic acid (FOLVITE) 1 MG tablet Take 1 mg by mouth daily.        . furosemide (LASIX) 80 MG tablet Take by mouth 2 (two) times daily.       . Insulin Regular Human (HUMULIN R IJ) Inject as directed. 3 units with breakfast, 2 units with lunch, 2 units with dinner.  Hold for CBG <150 or if patient eats less than 50%      . ipratropium (ATROVENT HFA) 17 MCG/ACT inhaler Inhale 2 puffs into the lungs every 6 (six) hours.        Marland Kitchen levothyroxine (SYNTHROID, LEVOTHROID) 75 MCG tablet Take 75 mcg by mouth daily.        . metolazone (ZAROXOLYN) 2.5 MG tablet Take 2.5 mg by mouth daily. Every Monday, Wed and Fri       . oxycodone (OXY-IR) 5 MG capsule Take 5 mg by mouth every 4 (four) hours as needed.        . ranitidine (ZANTAC) 150 MG tablet Take 150 mg by mouth at bedtime.        . sertraline (ZOLOFT) 100 MG tablet Take 100 mg by mouth daily.        Marland Kitchen spironolactone (ALDACTONE) 25 MG tablet Take 25 mg by mouth daily.        . Tamsulosin HCl (FLOMAX) 0.4 MG CAPS Take 0.4 mg by mouth daily.        Marland Kitchen warfarin (COUMADIN) 4 MG tablet Take 4 mg by mouth daily. As directed       . DISCONTD: insulin glargine (LANTUS SOLOSTAR) 100 UNIT/ML injection Inject 45 Units into the skin at bedtime.  13.5 mL  11    Allergies: Allergies    Allergen Reactions  . Ace Inhibitors     REACTION: cough  . Pioglitazone     REACTION: edema  Social history:  Ex-smoker.  He is currently living in [facility].  Vital Signs: BP 92/68  Pulse 76  Wt 248 lb 8 oz (112.719 kg)  SpO2 100%  PHYSICAL EXAM: Chronically ill-appearing male sitting in a wheelchair, in no acute distress HEENT: normal Neck: JVP 10-12 Cardiac:  normal S1, S2; RRR; no murmur Lungs:  RLL and LLL crackles, no wheeze Abd: soft,obese,  nontender, no hepatomegaly Ext: 2-3+ bilateral edema  Skin: warm and dry Neuro:  CNs 2-12 intact, no focal abnormalities noted Psych: Normal affect    ASSESSMENT AND PLAN:

## 2011-07-12 NOTE — Assessment & Plan Note (Addendum)
Difficult situation with soft BP, A/C renal failure and continued volume overload.  NYHA III-IIIb.  Will continue lasix but change to 60 mg BID to continue slow diuresis without pushing kidneys too hard, no metolazone at this time.  Check BMET today.  Continue to wear compression hose.  Ensure diet is low sodium at [facility].  Close follow ups.    Patient seen and examined with Ulyess Blossom PA-C. We discussed all aspects of the encounter. I agree with the assessment and plan as stated above.

## 2011-07-12 NOTE — Progress Notes (Signed)
Encounter addended by: Dolores Patty, MD on: 07/12/2011  5:14 PM<BR>     Documentation filed: Notes Section

## 2011-07-15 ENCOUNTER — Emergency Department (HOSPITAL_COMMUNITY): Payer: Medicare Other

## 2011-07-15 ENCOUNTER — Inpatient Hospital Stay (HOSPITAL_COMMUNITY)
Admission: EM | Admit: 2011-07-15 | Discharge: 2011-08-08 | DRG: 291 | Disposition: A | Discharge status: Other Acute Inpatient Hospital | Payer: Medicare Other | Attending: Internal Medicine | Admitting: Internal Medicine

## 2011-07-15 DIAGNOSIS — I5023 Acute on chronic systolic (congestive) heart failure: Principal | ICD-10-CM

## 2011-07-15 DIAGNOSIS — Z992 Dependence on renal dialysis: Secondary | ICD-10-CM

## 2011-07-15 DIAGNOSIS — E46 Unspecified protein-calorie malnutrition: Secondary | ICD-10-CM | POA: Diagnosis present

## 2011-07-15 DIAGNOSIS — IMO0002 Reserved for concepts with insufficient information to code with codable children: Secondary | ICD-10-CM | POA: Insufficient documentation

## 2011-07-15 DIAGNOSIS — I509 Heart failure, unspecified: Secondary | ICD-10-CM

## 2011-07-15 DIAGNOSIS — R059 Cough, unspecified: Secondary | ICD-10-CM

## 2011-07-15 DIAGNOSIS — Z7901 Long term (current) use of anticoagulants: Secondary | ICD-10-CM

## 2011-07-15 DIAGNOSIS — R570 Cardiogenic shock: Secondary | ICD-10-CM | POA: Diagnosis not present

## 2011-07-15 DIAGNOSIS — I498 Other specified cardiac arrhythmias: Secondary | ICD-10-CM | POA: Diagnosis not present

## 2011-07-15 DIAGNOSIS — R5383 Other fatigue: Secondary | ICD-10-CM

## 2011-07-15 DIAGNOSIS — I82409 Acute embolism and thrombosis of unspecified deep veins of unspecified lower extremity: Secondary | ICD-10-CM | POA: Diagnosis present

## 2011-07-15 DIAGNOSIS — F411 Generalized anxiety disorder: Secondary | ICD-10-CM | POA: Diagnosis present

## 2011-07-15 DIAGNOSIS — G4733 Obstructive sleep apnea (adult) (pediatric): Secondary | ICD-10-CM | POA: Insufficient documentation

## 2011-07-15 DIAGNOSIS — R7881 Bacteremia: Secondary | ICD-10-CM | POA: Diagnosis present

## 2011-07-15 DIAGNOSIS — A419 Sepsis, unspecified organism: Secondary | ICD-10-CM

## 2011-07-15 DIAGNOSIS — IMO0001 Reserved for inherently not codable concepts without codable children: Secondary | ICD-10-CM | POA: Diagnosis present

## 2011-07-15 DIAGNOSIS — L02419 Cutaneous abscess of limb, unspecified: Secondary | ICD-10-CM | POA: Diagnosis not present

## 2011-07-15 DIAGNOSIS — R627 Adult failure to thrive: Secondary | ICD-10-CM

## 2011-07-15 DIAGNOSIS — Z951 Presence of aortocoronary bypass graft: Secondary | ICD-10-CM

## 2011-07-15 DIAGNOSIS — J449 Chronic obstructive pulmonary disease, unspecified: Secondary | ICD-10-CM | POA: Diagnosis present

## 2011-07-15 DIAGNOSIS — I120 Hypertensive chronic kidney disease with stage 5 chronic kidney disease or end stage renal disease: Secondary | ICD-10-CM | POA: Diagnosis present

## 2011-07-15 DIAGNOSIS — N179 Acute kidney failure, unspecified: Secondary | ICD-10-CM | POA: Diagnosis present

## 2011-07-15 DIAGNOSIS — I10 Essential (primary) hypertension: Secondary | ICD-10-CM

## 2011-07-15 DIAGNOSIS — E1165 Type 2 diabetes mellitus with hyperglycemia: Secondary | ICD-10-CM

## 2011-07-15 DIAGNOSIS — L02415 Cutaneous abscess of right lower limb: Secondary | ICD-10-CM | POA: Clinically undetermined

## 2011-07-15 DIAGNOSIS — J9690 Respiratory failure, unspecified, unspecified whether with hypoxia or hypercapnia: Secondary | ICD-10-CM | POA: Diagnosis not present

## 2011-07-15 DIAGNOSIS — A4159 Other Gram-negative sepsis: Secondary | ICD-10-CM | POA: Diagnosis not present

## 2011-07-15 DIAGNOSIS — E871 Hypo-osmolality and hyponatremia: Secondary | ICD-10-CM | POA: Diagnosis present

## 2011-07-15 DIAGNOSIS — Z6835 Body mass index (BMI) 35.0-35.9, adult: Secondary | ICD-10-CM

## 2011-07-15 DIAGNOSIS — Z86718 Personal history of other venous thrombosis and embolism: Secondary | ICD-10-CM

## 2011-07-15 DIAGNOSIS — Z9981 Dependence on supplemental oxygen: Secondary | ICD-10-CM

## 2011-07-15 DIAGNOSIS — I2589 Other forms of chronic ischemic heart disease: Secondary | ICD-10-CM | POA: Insufficient documentation

## 2011-07-15 DIAGNOSIS — R197 Diarrhea, unspecified: Secondary | ICD-10-CM

## 2011-07-15 DIAGNOSIS — J4489 Other specified chronic obstructive pulmonary disease: Secondary | ICD-10-CM | POA: Diagnosis present

## 2011-07-15 DIAGNOSIS — R079 Chest pain, unspecified: Secondary | ICD-10-CM

## 2011-07-15 DIAGNOSIS — I5022 Chronic systolic (congestive) heart failure: Secondary | ICD-10-CM | POA: Diagnosis present

## 2011-07-15 DIAGNOSIS — R05 Cough: Secondary | ICD-10-CM

## 2011-07-15 DIAGNOSIS — Z66 Do not resuscitate: Secondary | ICD-10-CM | POA: Diagnosis not present

## 2011-07-15 DIAGNOSIS — L84 Corns and callosities: Secondary | ICD-10-CM

## 2011-07-15 DIAGNOSIS — I2510 Atherosclerotic heart disease of native coronary artery without angina pectoris: Secondary | ICD-10-CM

## 2011-07-15 DIAGNOSIS — E875 Hyperkalemia: Secondary | ICD-10-CM | POA: Diagnosis present

## 2011-07-15 DIAGNOSIS — R42 Dizziness and giddiness: Secondary | ICD-10-CM

## 2011-07-15 DIAGNOSIS — A0472 Enterocolitis due to Clostridium difficile, not specified as recurrent: Secondary | ICD-10-CM | POA: Diagnosis not present

## 2011-07-15 DIAGNOSIS — I4891 Unspecified atrial fibrillation: Secondary | ICD-10-CM | POA: Diagnosis present

## 2011-07-15 DIAGNOSIS — E785 Hyperlipidemia, unspecified: Secondary | ICD-10-CM

## 2011-07-15 DIAGNOSIS — J9620 Acute and chronic respiratory failure, unspecified whether with hypoxia or hypercapnia: Secondary | ICD-10-CM | POA: Diagnosis present

## 2011-07-15 DIAGNOSIS — E039 Hypothyroidism, unspecified: Secondary | ICD-10-CM

## 2011-07-15 DIAGNOSIS — Z7982 Long term (current) use of aspirin: Secondary | ICD-10-CM

## 2011-07-15 DIAGNOSIS — M1990 Unspecified osteoarthritis, unspecified site: Secondary | ICD-10-CM | POA: Diagnosis present

## 2011-07-15 DIAGNOSIS — R339 Retention of urine, unspecified: Secondary | ICD-10-CM

## 2011-07-15 DIAGNOSIS — D649 Anemia, unspecified: Secondary | ICD-10-CM | POA: Diagnosis present

## 2011-07-15 DIAGNOSIS — R5381 Other malaise: Secondary | ICD-10-CM

## 2011-07-15 DIAGNOSIS — L03119 Cellulitis of unspecified part of limb: Secondary | ICD-10-CM | POA: Diagnosis not present

## 2011-07-15 DIAGNOSIS — N186 End stage renal disease: Secondary | ICD-10-CM | POA: Diagnosis present

## 2011-07-15 DIAGNOSIS — R6520 Severe sepsis without septic shock: Secondary | ICD-10-CM | POA: Diagnosis present

## 2011-07-15 HISTORY — DX: Disorder of kidney and ureter, unspecified: N28.9

## 2011-07-15 LAB — URINE MICROSCOPIC-ADD ON

## 2011-07-15 LAB — DIFFERENTIAL
Basophils Absolute: 0 10*3/uL (ref 0.0–0.1)
Basophils Relative: 0 % (ref 0–1)
Eosinophils Relative: 3 % (ref 0–5)
Lymphocytes Relative: 9 % — ABNORMAL LOW (ref 12–46)
Monocytes Absolute: 0.8 10*3/uL (ref 0.1–1.0)

## 2011-07-15 LAB — BASIC METABOLIC PANEL
BUN: 139 mg/dL — ABNORMAL HIGH (ref 6–23)
BUN: 140 mg/dL — ABNORMAL HIGH (ref 6–23)
Calcium: 9.2 mg/dL (ref 8.4–10.5)
Calcium: 9.3 mg/dL (ref 8.4–10.5)
Creatinine, Ser: 4.81 mg/dL — ABNORMAL HIGH (ref 0.50–1.35)
GFR calc Af Amer: 14 mL/min — ABNORMAL LOW (ref >90)
GFR calc non Af Amer: 12 mL/min — ABNORMAL LOW (ref >90)
GFR calc non Af Amer: 12 mL/min — ABNORMAL LOW (ref >90)
Glucose, Bld: 147 mg/dL — ABNORMAL HIGH (ref 70–99)
Potassium: 5.7 mEq/L — ABNORMAL HIGH (ref 3.5–5.1)
Sodium: 140 mEq/L (ref 135–145)

## 2011-07-15 LAB — URINALYSIS, ROUTINE W REFLEX MICROSCOPIC
Bilirubin Urine: NEGATIVE
Glucose, UA: NEGATIVE mg/dL
Hgb urine dipstick: NEGATIVE
Ketones, ur: NEGATIVE mg/dL
pH: 5 (ref 5.0–8.0)

## 2011-07-15 LAB — CBC
HCT: 36.3 % — ABNORMAL LOW (ref 39.0–52.0)
MCHC: 32 g/dL (ref 30.0–36.0)
RDW: 18.4 % — ABNORMAL HIGH (ref 11.5–15.5)

## 2011-07-15 LAB — PROTIME-INR: INR: 2.53 — ABNORMAL HIGH (ref 0.00–1.49)

## 2011-07-15 LAB — GLUCOSE, CAPILLARY: Glucose-Capillary: 150 mg/dL — ABNORMAL HIGH (ref 70–99)

## 2011-07-15 NOTE — Consult Note (Signed)
Reason for Consult acute on Chronic renal failure  Increased edema and dyspnea on minimal exertion Referring Physician: Dr Sarita Bottom [name] is an 60 y.o. male.  HPI: 60 year old with increased dyspnea increased lower extremity swelling nausea loss of appetite and vomiting  Over 2 weeks history of CHF EF 20% and history of retention of urine  Past Medical History  Diagnosis Date  . CAD (coronary artery disease) 2007    RCA stent 2007; h/o stent to LAD and cutting balloon Dx, s/p CABG at [facility] 02/2011:  L-LAD, S-D1, S-RI, S-OM2  . Ischemic cardiomyopathy     Echo 5/12: Mild LVH, EF 25-30%, apical akinesis, inferoposterior akinesis, grade 2 diastolic dysfunction, mild MR, mild BAE, PASP 58  . Tobacco user 05/2010    PFT: no airflow obstruction.   . Hyperlipidemia   . Hypertension   . Diabetes mellitus   . OSA (obstructive sleep apnea) 05/2010    Sleep study AHI 54/hr with desat to 74%  . Personal history of DVT (deep vein thrombosis)     On chronic coumadin  . Obesity   . Anxiety   . Diabetic neuropathy   . Osteoarthritis   . Chronic respiratory failure   . Chronic systolic heart failure Echo 08/2010    EF 20-25%  . Atrial fibrillation     paroxysmal;  amiodarone; coumadin  . CKD (chronic kidney disease), stage III   . Pulmonary HTN   . Restrictive lung disease   . COPD (chronic obstructive pulmonary disease)   . Hypothyroidism   . Anemia     Past Surgical History  Procedure Date  . Spinal fusion   . Fournier gangrene 2006    Required surgical debridement  . Tonsilectomy, adenoidectomy, bilateral myringotomy and tubes 1956  . Coronary artery bypass graft     Family History  Problem Relation Age of Onset  . Cancer Sister     Social History:  reports that he has quit smoking. His smoking use included Cigarettes. He smoked .5 packs per day. He has never used smokeless tobacco. He reports that he drinks alcohol. He reports that he does not use illicit drugs.  Allergies:   Allergies  Allergen Reactions  . Ace Inhibitors     REACTION: cough  . Pioglitazone     REACTION: edema  I have reviewed the patient's current medications.  Medications:   Results for orders placed during the hospital encounter of 07/15/11 (from the past 48 hour(s))  BASIC METABOLIC PANEL     Status: Abnormal   Collection Time   07/15/11  2:27 PM      Component Value Range Comment   Sodium 138  135 - 145 (mEq/L)    Potassium 5.7 (*) 3.5 - 5.1 (mEq/L) HEMOLYSIS AT THIS LEVEL MAY AFFECT RESULT   Chloride 101  96 - 112 (mEq/L)    CO2 19  19 - 32 (mEq/L)    Glucose, Bld 171 (*) 70 - 99 (mg/dL)    BUN 161 (*) 6 - 23 (mg/dL) REPEATED TO VERIFY   Creatinine, Ser 4.81 (*) 0.50 - 1.35 (mg/dL)    Calcium 9.2  8.4 - 10.5 (mg/dL)    GFR calc non Af Amer 12 (*) >90 (mL/min)    GFR calc Af Amer 14 (*) >90 (mL/min)   PRO B NATRIURETIC PEPTIDE     Status: Abnormal   Collection Time   07/15/11  2:27 PM      Component Value Range Comment  BNP, POC 31546.0 (*) 0 - 125 (pg/mL)   DIFFERENTIAL     Status: Abnormal   Collection Time   07/15/11  3:32 PM      Component Value Range Comment   Neutrophils Relative 78 (*) 43 - 77 (%)    Neutro Abs 5.8  1.7 - 7.7 (K/uL)    Lymphocytes Relative 9 (*) 12 - 46 (%)    Lymphs Abs 0.7  0.7 - 4.0 (K/uL)    Monocytes Relative 11  3 - 12 (%)    Monocytes Absolute 0.8  0.1 - 1.0 (K/uL)    Eosinophils Relative 3  0 - 5 (%)    Eosinophils Absolute 0.2  0.0 - 0.7 (K/uL)    Basophils Relative 0  0 - 1 (%)    Basophils Absolute 0.0  0.0 - 0.1 (K/uL)   CBC     Status: Abnormal   Collection Time   07/15/11  3:32 PM      Component Value Range Comment   WBC 7.5  4.0 - 10.5 (K/uL)    RBC 4.12 (*) 4.22 - 5.81 (MIL/uL)    Hemoglobin 11.6 (*) 13.0 - 17.0 (g/dL)    HCT 95.6 (*) 21.3 - 52.0 (%)    MCV 88.1  78.0 - 100.0 (fL)    MCH 28.2  26.0 - 34.0 (pg)    MCHC 32.0  30.0 - 36.0 (g/dL)    RDW 08.6 (*) 57.8 - 15.5 (%)    Platelets 165  150 - 400 (K/uL)     TROPONIN I     Status: Normal   Collection Time   07/15/11  3:32 PM      Component Value Range Comment   Troponin I <0.30  <0.30 (ng/mL)   BASIC METABOLIC PANEL     Status: Abnormal   Collection Time   07/15/11  5:19 PM      Component Value Range Comment   Sodium 140  135 - 145 (mEq/L)    Potassium 4.5  3.5 - 5.1 (mEq/L) DELTA CHECK NOTED   Chloride 101  96 - 112 (mEq/L)    CO2 23  19 - 32 (mEq/L)    Glucose, Bld 147 (*) 70 - 99 (mg/dL)    BUN 469 (*) 6 - 23 (mg/dL)    Creatinine, Ser 6.29 (*) 0.50 - 1.35 (mg/dL)    Calcium 9.3  8.4 - 10.5 (mg/dL)    GFR calc non Af Amer 12 (*) >90 (mL/min)    GFR calc Af Amer 14 (*) >90 (mL/min)   PROTIME-INR     Status: Abnormal   Collection Time   07/15/11  7:53 PM      Component Value Range Comment   Prothrombin Time 27.7 (*) 11.6 - 15.2 (seconds) SLIGHT HEMOLYSIS   INR 2.53 (*) 0.00 - 1.49  SLIGHT HEMOLYSIS    Dg Pneumonia Chest 2v  07/15/2011  *RADIOLOGY REPORT*  Clinical Data: Shortness of breath, weakness, acute bronchitis  CHEST - 2 VIEW  Comparison: 06/24/2011  Findings: Previous right PICC line removed.  Coronary bypass changes noted.  Heart is enlarged with central vascular congestion. Band-like left midlung and bibasilar atelectasis noted, worse in the left lower lobe.  Small residual left effusion.  Upper lungs remain clear.  No pneumothorax.  Trachea is midline.  IMPRESSION: Cardiomegaly with vascular congestion, left mid lung and bibasilar atelectasis.  Residual smaller left effusion.  Original Report Authenticated By: Judie Petit. Ruel Favors, M.D.    Review of Systems  Constitutional: Negative for fever, chills, weight loss, malaise/fatigue and diaphoresis.  HENT: Negative.  Negative for hearing loss, ear pain, nosebleeds, tinnitus and ear discharge.   Eyes: Negative.   Respiratory: Positive for shortness of breath. Negative for cough, hemoptysis, sputum production and wheezing.   Cardiovascular: Positive for orthopnea and leg  swelling. Negative for chest pain, palpitations, claudication and PND.  Gastrointestinal: Positive for nausea, vomiting and diarrhea. Negative for heartburn, abdominal pain, constipation, blood in stool and melena.  Genitourinary: Negative.  Negative for dysuria.  Musculoskeletal: Negative.   Skin: Negative for itching and rash.  Neurological: Positive for weakness. Negative for headaches.  Endo/Heme/Allergies: Negative.   Psychiatric/Behavioral: Negative.    There were no vitals taken for this visit. Physical Exam  Constitutional: He is oriented to person, place, and time. He appears distressed. He is not intubated.  HENT:  Head: Normocephalic and atraumatic.  Mouth/Throat: Oropharynx is clear and moist.  Eyes: Conjunctivae and EOM are normal. Pupils are equal, round, and reactive to light.  Neck: Normal range of motion. Neck supple.  Cardiovascular: Normal rate, regular rhythm, normal heart sounds and intact distal pulses.   Respiratory: Accessory muscle usage present. Tachypnea noted. No apnea. He is not intubated. He is in respiratory distress. He has decreased breath sounds. He has no wheezes. He has no rhonchi. He has rales.  GI: Soft. Bowel sounds are normal.  Musculoskeletal: Normal range of motion. He exhibits edema.  Neurological: He is alert and oriented to person, place, and time. He has normal reflexes.  Skin: Skin is warm. He is diaphoretic. There is pallor.  Psychiatric: He has a normal mood and affect. His behavior is normal. Judgment and thought content normal.    Assessment/Plan: 1.Acute on Chronic renal failure - suggestive of urinary retention would obtain a renal ultra sound and    place foley catheter    Check urinalysis and microscopy    Daily weights    I's and o's  2. CHF start IV diuresis  160 mg Lasix q 8hrs  3. Hyperkalemia    Diurese and Foley obtain renal panel daily stop spirinolactone      Aarini Slee W 07/15/2011, 8:32 PM

## 2011-07-15 NOTE — H&P (Signed)
NAMEZIARE, CRYDER NO.:  [id number]  MEDICAL RECORD NO.:  [id number]  LOCATION:  [facility]                         FACILITY:  [facility]  PHYSICIAN:  Andreas Blower, MD       DATE OF BIRTH:  14-Jul-1951  DATE OF ADMISSION:  07/15/2011 DATE OF DISCHARGE:                             HISTORY & PHYSICAL   PRIMARY CARE PHYSICIAN:  Lenon Curt. Chilton Si, MD  PRIMARY CARDIOLOGIST:  [name], MD, [facility]  CHIEF COMPLAINT:  Shortness of breath.  HISTORY OF PRESENT ILLNESS:  Mr. [name] is a 60 year old Caucasian male with history of coronary artery disease with recent coronary artery bypass graft x4 at [facility] on June 31, 2012 with chronic kidney disease who presents with the above complaints.  The patient was recently discharged on June 29, 2011 with acute on chronic systolic heart failure.  Subsequently after being discharged, he reports that he has been getting slowly short of breath with increase in his weight.  He had laboratory work checked on July 12, 2011 which came back showing creatinine was 5.3 which is vastly different from baseline creatinine of 1.99.  As a result, the patient was transferred to the ER for further evaluation.  Other than being short of breath and tired, he has no other specific complaints.  Denies any chest pain.  Denies any fevers.  Denies any abdominal pain, did complain of diarrhea yesterday but has not had any since then.  Denies any headaches or vision changes. Given the elevation of his creatinine with no shortness of breath, the Hospitalist Service was asked to admit the patient for further evaluation.  REVIEW OF SYSTEMS:  All systems were reviewed with the patient was positive as per HPI, otherwise all systems are negative.  PAST MEDICAL HISTORY: 1. History of acute on chronic systolic heart failure with an ejection     fraction of 25-30% based on a 2-D echocardiogram on Feb 24, 2011,     with a diastolic  dysfunction.  Discharge weight on June 29, 2011     was 115.2 kg. 2. History of coronary artery disease with recent coronary artery     bypass graft x4 at [facility] on June 31, 2012 with a     placement of left internal mammary artery to left anterior     descending, vein graft to the first diagonal, ramus intermedius to     the second obtuse marginal. 3. Bacteremia from surgical site infection with methicillin-     susceptible Staphylococcus aureus with hospitalization in August     2012, was transferred to [facility] for treatment and had 2 JP drains     placed which have been discontinued. 4. AFib, on anticoagulation. 5. History of DVT, on chronic anticoagulation. 6. Hypertension. 7. Hyperlipidemia. 8. Type 2 diabetes. 9. History of chronic obstructive pulmonary disease with chronic     respiratory failure on chronic O2 with severe restrictive lung     disease. 10.Chronic kidney disease, stage III, with history of ATN     postoperatively. 11.Hypothyroidism. 12.Obstructive sleep apnea. 13.History of urinary retention. 14.Obesity.15.Anxiety. 16.History of osteoarthritis. 17.History of intermittent left bundle-branch block. 18.History of  Fournier gangrene. 19.History of normocytic anemia. 20.History of surgical debridement for Fournier gangrene.  SOCIAL HISTORY:  The patient has not smoked since Feb 19, 2011.  Does not report using any alcohol.  Currently is residing at the skilled nursing facility.  FAMILY HISTORY:  Significant for sister having breast cancer.  Mother having dementia.  HOME MEDICATIONS:  To be accurately reconciled by Pharmacy.  PHYSICAL EXAMINATION:  VITAL SIGNS:  Temperature is 97.4, blood pressure is 104/73, pulse of 105, respiration 20, saturating at 93% on few liters of oxygen. GENERAL:  The patient was awake, oriented, appeared to be in some distress and was lying in bed. HEENT:  Extraocular motions are intact.  Pupils equal, round.   Moist mucous membranes. NECK:  Supple. HEART:  Regular with S1-S2. LUNGS:  Crackles at bases bilaterally which extended all the way up to the mid lungs bilaterally. ABDOMEN:  Soft, nontender, nondistended.  Positive bowel sounds. EXTREMITIES:  The patient had 2+ lower extremity edema.  No clubbing or cyanosis. NEUROLOGIC:  Cranial nerves X through XII grossly intact.  Had 5/5 motor strength in upper as well as lower extremities.  RADIOLOGY/IMAGING:  The patient had a chest x-ray which shows cardiomegaly with vascular congestion, left mid lung and bibasilar atelectasis.  Residual smaller left effusion.  LABORATORY DATA:  CBC shows a white count of 7.5, hemoglobin 11.6, hematocrit 36.3, platelet count 165.  Electrolytes normal, except with a BUN of 140, creatinine 4.81.  Troponin less than 0.30.  BNP is 31,546.  ASSESSMENT/PLAN: 1. Acute renal failure on chronic kidney disease, stage 3.  Suspect     the patient's acute renal failure is likely due to acute on chronic     systolic heart failure.  The patient is volume overloaded.  We will     get a renal ultrasound for better evaluation.  We will send for UA     and urine culture.  We will get a Renal consultation.  We will     start the patient on IV Lasix and diurese him aggressively and     trend his creatinine. 2. Acute on chronic systolic congestive heart failure.  [place]     [facility] has been consulted and will evaluate him.  As indicated     above, we changed his Lasix from oral to IV and with aggressive     diuresis.  We will continue Coreg. 3. History of coronary disease.  Continue home medications with hold     parameters.  Continue aspirin. 4. Shortness of breath with acute respiratory failure likely due to     acute on chronic systolic congestive heart failure.  Diuresis as     indicated above.  The patient already anticoagulated on Coumadin. 5. AFib, on chronic anticoagulation.  Currently slightly tachycardic.      Continue Coreg and monitor for now. 6. History of deep venous thrombosis, on chronic anticoagulation.     Continue Coumadin. 7. Anemia.  Hemoglobin stable, likely suspect due to chronic kidney     disease.  We will send for anemia panel. 8. Hypertension, borderline low. Continue home medications with     hold parameters. 9.Type 2 diabetes.  We will have the patient on sliding scale     insulin. 10.History of chronic obstructive pulmonary disease, on home O2,     stable at this time. 11.Hypothyroidism, continue Synthroid. 12.Morbid obesity.  Diet and exercise as outpatient. 13.Obstructive sleep apnea.  Continue CPAP. 14.Hyperkalemia.  Likely due to a laboratory error  from hemolysis,     resolved. 15.Prophylaxis, continue Coumadin. 16.Code status.  The patient is full code.  This was discussed with     the patient at the time of admission.  TIME SPENT:  Time spent on admission, talking to the patient, consultants, and coordinating care was 1 hour.   Andreas Blower, MD   SR/MEDQ  D:  07/15/2011  T:  07/15/2011  Job:  [id number]  Electronically Signed by Wardell Heath Tionne Carelli  on 07/15/2011 11:22:13 PM

## 2011-07-16 ENCOUNTER — Inpatient Hospital Stay (HOSPITAL_COMMUNITY): Payer: Medicare Other

## 2011-07-16 LAB — RENAL FUNCTION PANEL
CO2: 18 mEq/L — ABNORMAL LOW (ref 19–32)
Calcium: 9.6 mg/dL (ref 8.4–10.5)
Chloride: 102 mEq/L (ref 96–112)
Creatinine, Ser: 4.92 mg/dL — ABNORMAL HIGH (ref 0.50–1.35)
GFR calc Af Amer: 13 mL/min — ABNORMAL LOW (ref >90)
GFR calc non Af Amer: 11 mL/min — ABNORMAL LOW (ref >90)
Glucose, Bld: 145 mg/dL — ABNORMAL HIGH (ref 70–99)
Sodium: 141 mEq/L (ref 135–145)

## 2011-07-16 LAB — GLUCOSE, CAPILLARY
Glucose-Capillary: 130 mg/dL — ABNORMAL HIGH (ref 70–99)
Glucose-Capillary: 146 mg/dL — ABNORMAL HIGH (ref 70–99)

## 2011-07-16 LAB — VITAMIN B12: Vitamin B-12: 627 pg/mL (ref 211–911)

## 2011-07-16 LAB — CBC
HCT: 39.1 % (ref 39.0–52.0)
Hemoglobin: 12.4 g/dL — ABNORMAL LOW (ref 13.0–17.0)
MCHC: 31.7 g/dL (ref 30.0–36.0)
MCV: 89.3 fL (ref 78.0–100.0)
RDW: 18.3 % — ABNORMAL HIGH (ref 11.5–15.5)

## 2011-07-16 LAB — FOLATE: Folate: >20 ng/mL

## 2011-07-16 LAB — URINE CULTURE: Culture: NO GROWTH

## 2011-07-16 LAB — PRO B NATRIURETIC PEPTIDE: Pro B Natriuretic peptide (BNP): 37924 pg/mL — ABNORMAL HIGH (ref 0–125)

## 2011-07-16 LAB — IRON AND TIBC
Iron: 44 ug/dL (ref 42–135)
UIBC: 297 ug/dL (ref 125–400)

## 2011-07-17 LAB — BLOOD GAS, ARTERIAL
Delivery systems: POSITIVE
Drawn by: 225631
Expiratory PAP: 10
Mode: POSITIVE
O2 Content: 10 L/min
Patient temperature: 98.6
pCO2 arterial: 37.1 mmHg (ref 35.0–45.0)
pH, Arterial: 7.385 (ref 7.350–7.450)

## 2011-07-17 LAB — GLUCOSE, CAPILLARY: Glucose-Capillary: 149 mg/dL — ABNORMAL HIGH (ref 70–99)

## 2011-07-17 LAB — PROTIME-INR: Prothrombin Time: 40.7 seconds — ABNORMAL HIGH (ref 11.6–15.2)

## 2011-07-17 LAB — BASIC METABOLIC PANEL
Calcium: 9.8 mg/dL (ref 8.4–10.5)
GFR calc Af Amer: 13 mL/min — ABNORMAL LOW (ref >90)
GFR calc non Af Amer: 11 mL/min — ABNORMAL LOW (ref >90)
Potassium: 4.7 mEq/L (ref 3.5–5.1)
Sodium: 141 mEq/L (ref 135–145)

## 2011-07-18 ENCOUNTER — Inpatient Hospital Stay (HOSPITAL_COMMUNITY): Payer: Medicare Other

## 2011-07-18 DIAGNOSIS — N170 Acute kidney failure with tubular necrosis: Secondary | ICD-10-CM

## 2011-07-18 DIAGNOSIS — I5021 Acute systolic (congestive) heart failure: Secondary | ICD-10-CM

## 2011-07-18 DIAGNOSIS — D684 Acquired coagulation factor deficiency: Secondary | ICD-10-CM

## 2011-07-18 DIAGNOSIS — M79609 Pain in unspecified limb: Secondary | ICD-10-CM

## 2011-07-18 DIAGNOSIS — J9600 Acute respiratory failure, unspecified whether with hypoxia or hypercapnia: Secondary | ICD-10-CM

## 2011-07-18 DIAGNOSIS — R0602 Shortness of breath: Secondary | ICD-10-CM

## 2011-07-18 DIAGNOSIS — M7989 Other specified soft tissue disorders: Secondary | ICD-10-CM

## 2011-07-18 LAB — GLUCOSE, CAPILLARY
Glucose-Capillary: 187 mg/dL — ABNORMAL HIGH (ref 70–99)
Glucose-Capillary: 179 mg/dL — ABNORMAL HIGH (ref 70–99)
Glucose-Capillary: 167 mg/dL — ABNORMAL HIGH (ref 70–99)
Glucose-Capillary: 137 mg/dL — ABNORMAL HIGH (ref 70–99)

## 2011-07-18 LAB — RENAL FUNCTION PANEL
Albumin: 2.9 g/dL — ABNORMAL LOW (ref 3.5–5.2)
BUN: 151 mg/dL — ABNORMAL HIGH (ref 6–23)
Chloride: 99 mEq/L (ref 96–112)
Creatinine, Ser: 5.33 mg/dL — ABNORMAL HIGH (ref 0.50–1.35)
GFR calc non Af Amer: 10 mL/min — ABNORMAL LOW (ref >90)
Phosphorus: 8.5 mg/dL — ABNORMAL HIGH (ref 2.3–4.6)

## 2011-07-18 LAB — DIC (DISSEMINATED INTRAVASCULAR COAGULATION)PANEL
D-Dimer, Quant: 1.69 ug/mL-FEU — ABNORMAL HIGH (ref 0.00–0.48)
Fibrinogen: 736 mg/dL — ABNORMAL HIGH (ref 204–475)
Platelets: 130 10*3/uL — ABNORMAL LOW (ref 150–400)
Smear Review: NONE SEEN
aPTT: 44 seconds — ABNORMAL HIGH (ref 24–37)

## 2011-07-18 LAB — HEPATITIS B SURFACE ANTIBODY,QUALITATIVE: Hep B S Ab: NEGATIVE

## 2011-07-18 LAB — PROTIME-INR
INR: 5.91 (ref 0.00–1.49)
INR: 5.94 (ref 0.00–1.49)
Prothrombin Time: 53.7 seconds — ABNORMAL HIGH (ref 11.6–15.2)
Prothrombin Time: 19.5 seconds — ABNORMAL HIGH (ref 11.6–15.2)

## 2011-07-18 LAB — LACTIC ACID, PLASMA: Lactic Acid, Venous: 1.7 mmol/L (ref 0.5–2.2)

## 2011-07-19 ENCOUNTER — Inpatient Hospital Stay (HOSPITAL_COMMUNITY): Payer: Medicare Other

## 2011-07-19 ENCOUNTER — Encounter (HOSPITAL_COMMUNITY): Payer: Self-pay

## 2011-07-19 ENCOUNTER — Encounter (HOSPITAL_COMMUNITY): Payer: Medicare Other

## 2011-07-19 DIAGNOSIS — I5021 Acute systolic (congestive) heart failure: Secondary | ICD-10-CM

## 2011-07-19 LAB — GLUCOSE, CAPILLARY
Glucose-Capillary: 160 mg/dL — ABNORMAL HIGH (ref 70–99)
Glucose-Capillary: 148 mg/dL — ABNORMAL HIGH (ref 70–99)
Glucose-Capillary: 112 mg/dL — ABNORMAL HIGH (ref 70–99)
Glucose-Capillary: 106 mg/dL — ABNORMAL HIGH (ref 70–99)
Glucose-Capillary: 147 mg/dL — ABNORMAL HIGH (ref 70–99)

## 2011-07-19 LAB — PROTIME-INR: INR: 1.42 (ref 0.00–1.49)

## 2011-07-19 LAB — CBC
HCT: 34.5 % — ABNORMAL LOW (ref 39.0–52.0)
Hemoglobin: 11.1 g/dL — ABNORMAL LOW (ref 13.0–17.0)
RBC: 3.92 MIL/uL — ABNORMAL LOW (ref 4.22–5.81)
RDW: 18.3 % — ABNORMAL HIGH (ref 11.5–15.5)
WBC: 9.2 10*3/uL (ref 4.0–10.5)

## 2011-07-19 LAB — BASIC METABOLIC PANEL
CO2: 24 mEq/L (ref 19–32)
Calcium: 9.9 mg/dL (ref 8.4–10.5)
GFR calc non Af Amer: 11 mL/min — ABNORMAL LOW (ref >90)
Potassium: 4 mEq/L (ref 3.5–5.1)
Sodium: 140 mEq/L (ref 135–145)

## 2011-07-19 LAB — PREPARE FRESH FROZEN PLASMA
Unit division: 0
Unit division: 0

## 2011-07-20 ENCOUNTER — Inpatient Hospital Stay (HOSPITAL_COMMUNITY): Payer: Medicare Other

## 2011-07-20 DIAGNOSIS — J9600 Acute respiratory failure, unspecified whether with hypoxia or hypercapnia: Secondary | ICD-10-CM

## 2011-07-20 DIAGNOSIS — I5021 Acute systolic (congestive) heart failure: Secondary | ICD-10-CM

## 2011-07-20 DIAGNOSIS — D684 Acquired coagulation factor deficiency: Secondary | ICD-10-CM

## 2011-07-20 DIAGNOSIS — N170 Acute kidney failure with tubular necrosis: Secondary | ICD-10-CM

## 2011-07-20 LAB — POCT I-STAT 3, ART BLOOD GAS (G3+)
Acid-base deficit: 3 mmol/L — ABNORMAL HIGH (ref 0.0–2.0)
Bicarbonate: 21.7 mEq/L (ref 20.0–24.0)
O2 Saturation: 98 %
Patient temperature: 98.6
TCO2: 23 mmol/L (ref 0–100)

## 2011-07-20 LAB — GLUCOSE, CAPILLARY
Glucose-Capillary: 100 mg/dL — ABNORMAL HIGH (ref 70–99)
Glucose-Capillary: 115 mg/dL — ABNORMAL HIGH (ref 70–99)
Glucose-Capillary: 131 mg/dL — ABNORMAL HIGH (ref 70–99)
Glucose-Capillary: 142 mg/dL — ABNORMAL HIGH (ref 70–99)
Glucose-Capillary: 97 mg/dL (ref 70–99)
Glucose-Capillary: 99 mg/dL (ref 70–99)

## 2011-07-20 LAB — BASIC METABOLIC PANEL
BUN: 139 mg/dL — ABNORMAL HIGH (ref 6–23)
Calcium: 9.8 mg/dL (ref 8.4–10.5)
Creatinine, Ser: 5.11 mg/dL — ABNORMAL HIGH (ref 0.50–1.35)
GFR calc non Af Amer: 11 mL/min — ABNORMAL LOW (ref >90)
Glucose, Bld: 142 mg/dL — ABNORMAL HIGH (ref 70–99)

## 2011-07-20 LAB — CBC
MCH: 28.4 pg (ref 26.0–34.0)
MCHC: 32.6 g/dL (ref 30.0–36.0)
MCV: 87.3 fL (ref 78.0–100.0)
Platelets: 136 10*3/uL — ABNORMAL LOW (ref 150–400)
RDW: 18.2 % — ABNORMAL HIGH (ref 11.5–15.5)

## 2011-07-21 ENCOUNTER — Inpatient Hospital Stay (HOSPITAL_COMMUNITY): Payer: Medicare Other

## 2011-07-21 LAB — RENAL FUNCTION PANEL
Albumin: 2.7 g/dL — ABNORMAL LOW (ref 3.5–5.2)
BUN: 68 mg/dL — ABNORMAL HIGH (ref 6–23)
Calcium: 9.2 mg/dL (ref 8.4–10.5)
Creatinine, Ser: 2.61 mg/dL — ABNORMAL HIGH (ref 0.50–1.35)
GFR calc Af Amer: 24 mL/min — ABNORMAL LOW (ref >90)
GFR calc non Af Amer: 21 mL/min — ABNORMAL LOW (ref >90)
Glucose, Bld: 93 mg/dL (ref 70–99)
Phosphorus: 4.1 mg/dL (ref 2.3–4.6)
Phosphorus: 4.1 mg/dL (ref 2.3–4.6)
Potassium: 3.4 mEq/L — ABNORMAL LOW (ref 3.5–5.1)
Sodium: 142 mEq/L (ref 135–145)

## 2011-07-21 LAB — GLUCOSE, CAPILLARY
Glucose-Capillary: 85 mg/dL (ref 70–99)
Glucose-Capillary: 94 mg/dL (ref 70–99)
Glucose-Capillary: 98 mg/dL (ref 70–99)

## 2011-07-21 LAB — CULTURE, BLOOD (ROUTINE X 2): Culture  Setup Time: 201210220042

## 2011-07-21 LAB — PROTIME-INR
INR: 2.04 — ABNORMAL HIGH (ref 0.00–1.49)
Prothrombin Time: 23.4 seconds — ABNORMAL HIGH (ref 11.6–15.2)

## 2011-07-21 LAB — BLOOD GAS, ARTERIAL
Bicarbonate: 22 mEq/L (ref 20.0–24.0)
O2 Saturation: 99.1 %
PEEP: 5 cmH2O
TCO2: 23.1 mmol/L (ref 0–100)
pH, Arterial: 7.445 (ref 7.350–7.450)
pO2, Arterial: 118 mmHg — ABNORMAL HIGH (ref 80.0–100.0)

## 2011-07-21 LAB — MAGNESIUM: Magnesium: 2.4 mg/dL (ref 1.5–2.5)

## 2011-07-21 LAB — CBC
HCT: 33 % — ABNORMAL LOW (ref 39.0–52.0)
Hemoglobin: 10.8 g/dL — ABNORMAL LOW (ref 13.0–17.0)
RBC: 3.82 MIL/uL — ABNORMAL LOW (ref 4.22–5.81)

## 2011-07-22 ENCOUNTER — Inpatient Hospital Stay (HOSPITAL_COMMUNITY): Payer: Medicare Other

## 2011-07-22 DIAGNOSIS — D684 Acquired coagulation factor deficiency: Secondary | ICD-10-CM

## 2011-07-22 DIAGNOSIS — J9600 Acute respiratory failure, unspecified whether with hypoxia or hypercapnia: Secondary | ICD-10-CM

## 2011-07-22 DIAGNOSIS — N170 Acute kidney failure with tubular necrosis: Secondary | ICD-10-CM

## 2011-07-22 DIAGNOSIS — I5021 Acute systolic (congestive) heart failure: Secondary | ICD-10-CM

## 2011-07-22 LAB — RENAL FUNCTION PANEL
Albumin: 2.9 g/dL — ABNORMAL LOW (ref 3.5–5.2)
Albumin: 2.9 g/dL — ABNORMAL LOW (ref 3.5–5.2)
Calcium: 9.4 mg/dL (ref 8.4–10.5)
Chloride: 102 mEq/L (ref 96–112)
GFR calc Af Amer: 43 mL/min — ABNORMAL LOW (ref >90)
GFR calc non Af Amer: 45 mL/min — ABNORMAL LOW (ref >90)
Glucose, Bld: 117 mg/dL — ABNORMAL HIGH (ref 70–99)
Phosphorus: 3.4 mg/dL (ref 2.3–4.6)
Potassium: 4.2 mEq/L (ref 3.5–5.1)
Sodium: 140 mEq/L (ref 135–145)

## 2011-07-22 LAB — CBC
HCT: 35.1 % — ABNORMAL LOW (ref 39.0–52.0)
MCHC: 31.9 g/dL (ref 30.0–36.0)
Platelets: 135 10*3/uL — ABNORMAL LOW (ref 150–400)
RDW: 18.6 % — ABNORMAL HIGH (ref 11.5–15.5)
WBC: 13.8 10*3/uL — ABNORMAL HIGH (ref 4.0–10.5)

## 2011-07-22 LAB — PROTIME-INR: INR: 2.95 — ABNORMAL HIGH (ref 0.00–1.49)

## 2011-07-22 LAB — MAGNESIUM: Magnesium: 2.5 mg/dL (ref 1.5–2.5)

## 2011-07-22 LAB — GLUCOSE, CAPILLARY
Glucose-Capillary: 115 mg/dL — ABNORMAL HIGH (ref 70–99)
Glucose-Capillary: 85 mg/dL (ref 70–99)

## 2011-07-22 LAB — APTT: aPTT: 74 seconds — ABNORMAL HIGH (ref 24–37)

## 2011-07-22 NOTE — Consult Note (Signed)
NAMETYHEEM, BOUGHNER NO.:  [id number]  MEDICAL RECORD NO.:  [id number]  LOCATION:  2603                         FACILITY:  [facility]  PHYSICIAN:  [name], M.D. DATE OF BIRTH:  Oct 12, 1950  DATE OF CONSULTATION:  07/15/2011 DATE OF DISCHARGE:                                CONSULTATION   [name] is a 60 year old gentleman with a history of coronary artery disease and congestive heart failure.  He is admitted to the hospital with marked volume overload, generalized weakness, and acute on chronic renal failure.  [name] has a history of an ischemic cardiomyopathy.  His ejection fraction was around 25-30%.  He was found to have severe coronary artery disease and had bypass grafting at [facility] on March 18, 2011.  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  He also had paroxysmal atrial fibrillation and has been treated with some amiodarone.  The patient has had progressive shortness of breath and renal insufficiency over the past several weeks.  He was seen in our office on several occasions for volume overload and his diuretics were increased rather aggressively.  He was treated with some Zaroxolyn.  His creatinine at that time was 1.5.  He was treated with Zaroxolyn and then several weeks later, his creatinine was noted to be 5.  Dr. Gala Graham called the [facility] to hold his Lasix and they gave him some fluids. Unfortunately the patient did not improve and ended up coming to the emergency room.  He has had progressive shortness of breath and generalized weakness.  He has chest soreness but denies any episodes of angina.  CURRENT MEDICATIONS:  According to [facility] information reveals that he is on: 1. Aspirin 81 mg a day. 2. Dulcolax as needed. 3. Carvedilol 3.125 mg once a day. 4. Proscar 5 mg a day. 5. Lasix 80 mg 2 times a day. 6. Metolazone 5 mg starting several weeks ago. 7. Insulin sliding  scale. 8. Atrovent inhaler. 9. Synthroid 75 mcg a day. 10.Oxycodone every 4 hours as needed. 11.Zantac as needed. 12.Aldactone 25 mg a day. 13.Flomax 0.4 mg a day. 14.Coumadin 4 mg a day.  ALLERGIC:  PIOGLITAZONE and ACE INHIBITORS.  PAST MEDICAL HISTORY: 1. Coronary artery disease - status post coronary artery bypass     grafting at [facility] in June of this year. 2. Ischemic cardiomyopathy with an ejection fraction of 25-30%. 3. History of hyperlipidemia. 4. Hypertension. 5. Diabetes mellitus. 6. Sleep apnea. 7. History of DVT - on chronic Coumadin. 8. Obesity. 9. Obstructive sleep apnea. 10.Diabetic retinopathy. 11.Stage III chronic kidney disease. 12.Pulmonary hypertension. 13.Restrictive lung disease. 14.Hypothyroidism. 15.Anemia.  FAMILY HISTORY:  Positive for cancer in his sister.  SOCIAL HISTORY:  The patient quit smoking in May of this year.  He drinks alcohol occasionally.  REVIEW OF SYSTEMS:  Noted in the HPI.  All other systems are negative.  PHYSICAL EXAMINATION:  GENERAL:  He is a middle-aged gentleman who appears to be in mild-to-moderate distress.  He appears to be fairly pale.  He is somewhat uncomfortable. VITAL SIGNS:  His heart rate is 100, his blood pressure is 105/73. HEENT:  Reveals  elevated JVD.  He had JVD up to his earlobes, sitting upright in bed.  His carotids were 2+. LUNGS:  Fairly clear. HEART:  Regular rate.  S1, S2.  He had a soft systolic murmur.  His sternal wound was still healing.  There was no evidence of obvious infection.  It was appropriately tender.  There was no drainage. ABDOMEN:  Mildly obese. EXTREMITIES:  He has tense 2 to 3+ pitting edema.  He has compression hose on and still has edema beneath his hose and also above his hose.  His EKG reveals normal sinus rhythm.  He has a left bundle branch block.  LABORATORY DATA:  Reveals a white blood cell count of 7.5, hemoglobin 11.6, hematocrit 36.3, platelet count is 165,000.   Sodium is 140, potassium is 4.5, chloride is 101, CO2 is 23, glucose is 147, BUN is 140, creatinine is 4.8.  His B natriuretic peptide is 31,546.  His chest x-ray reveals that he has had coronary artery bypass grafting. He has cardiomegaly with vascular congestion.  He has mid left lung field atelectasis.  He has a small effusion.  IMPRESSION:  [name] presents with marked volume overload.  It is clear now that he has progressive renal failure.  In fact, his creatinine is around 5.  I do not think that p.o. diuretics are going to work.  At this point, he needs to be treated with IV Lasix and hopefully his creatinine will improve.  We may need to get the assistance of Nephrology.  I would recommend IV Lasix 80 mg twice a day.  He also may need milrinone to help support his congestive heart failure.  I would recommend an echocardiogram tomorrow. We will continue to follow with you.     [name], M.D.     PJN/MEDQ  D:  07/15/2011  T:  07/16/2011  Job:  [id number]  cc:   Juan Rotunda, MD, [facility]. Bensimhon, MD Juan Mayhew, MD  Electronically Signed by [name] M.D. on 07/22/2011 05:44:19 AM

## 2011-07-23 ENCOUNTER — Inpatient Hospital Stay (HOSPITAL_COMMUNITY): Payer: Medicare Other

## 2011-07-23 LAB — RENAL FUNCTION PANEL
BUN: 26 mg/dL — ABNORMAL HIGH (ref 6–23)
CO2: 25 mEq/L (ref 19–32)
CO2: 23 mEq/L (ref 19–32)
Chloride: 100 mEq/L (ref 96–112)
GFR calc Af Amer: 58 mL/min — ABNORMAL LOW (ref >90)
GFR calc Af Amer: 68 mL/min — ABNORMAL LOW (ref >90)
GFR calc non Af Amer: 50 mL/min — ABNORMAL LOW (ref >90)
Glucose, Bld: 115 mg/dL — ABNORMAL HIGH (ref 70–99)
Potassium: 4.3 mEq/L (ref 3.5–5.1)
Potassium: 4.1 mEq/L (ref 3.5–5.1)
Sodium: 137 mEq/L (ref 135–145)
Sodium: 138 mEq/L (ref 135–145)

## 2011-07-23 LAB — POCT I-STAT 3, ART BLOOD GAS (G3+)
Bicarbonate: 21.1 mEq/L (ref 20.0–24.0)
TCO2: 22 mmol/L (ref 0–100)
pCO2 arterial: 39.4 mmHg (ref 35.0–45.0)
pH, Arterial: 7.333 — ABNORMAL LOW (ref 7.350–7.450)

## 2011-07-23 LAB — GLUCOSE, CAPILLARY
Glucose-Capillary: 103 mg/dL — ABNORMAL HIGH (ref 70–99)
Glucose-Capillary: 95 mg/dL (ref 70–99)

## 2011-07-23 LAB — MAGNESIUM: Magnesium: 2.7 mg/dL — ABNORMAL HIGH (ref 1.5–2.5)

## 2011-07-23 LAB — CBC
Hemoglobin: 11.9 g/dL — ABNORMAL LOW (ref 13.0–17.0)
MCHC: 31.2 g/dL (ref 30.0–36.0)
RBC: 4.23 MIL/uL (ref 4.22–5.81)

## 2011-07-23 LAB — AMMONIA: Ammonia: 35 umol/L (ref 11–60)

## 2011-07-24 ENCOUNTER — Inpatient Hospital Stay (HOSPITAL_COMMUNITY): Payer: Medicare Other

## 2011-07-24 DIAGNOSIS — Z9911 Dependence on respirator [ventilator] status: Secondary | ICD-10-CM

## 2011-07-24 DIAGNOSIS — N170 Acute kidney failure with tubular necrosis: Secondary | ICD-10-CM

## 2011-07-24 DIAGNOSIS — J9600 Acute respiratory failure, unspecified whether with hypoxia or hypercapnia: Secondary | ICD-10-CM

## 2011-07-24 DIAGNOSIS — I5021 Acute systolic (congestive) heart failure: Secondary | ICD-10-CM

## 2011-07-24 LAB — GLUCOSE, CAPILLARY
Glucose-Capillary: 104 mg/dL — ABNORMAL HIGH (ref 70–99)
Glucose-Capillary: 94 mg/dL (ref 70–99)
Glucose-Capillary: 112 mg/dL — ABNORMAL HIGH (ref 70–99)
Glucose-Capillary: 153 mg/dL — ABNORMAL HIGH (ref 70–99)

## 2011-07-24 LAB — RENAL FUNCTION PANEL
Albumin: 2.5 g/dL — ABNORMAL LOW (ref 3.5–5.2)
Albumin: 2.5 g/dL — ABNORMAL LOW (ref 3.5–5.2)
BUN: 22 mg/dL (ref 6–23)
CO2: 24 mEq/L (ref 19–32)
Calcium: 9.1 mg/dL (ref 8.4–10.5)
Chloride: 102 mEq/L (ref 96–112)
Creatinine, Ser: 1.16 mg/dL (ref 0.50–1.35)
GFR calc non Af Amer: 66 mL/min — ABNORMAL LOW (ref >90)
GFR calc non Af Amer: 70 mL/min — ABNORMAL LOW (ref >90)
Phosphorus: 2.1 mg/dL — ABNORMAL LOW (ref 2.3–4.6)
Potassium: 3.8 mEq/L (ref 3.5–5.1)

## 2011-07-24 LAB — CBC
Hemoglobin: 11.2 g/dL — ABNORMAL LOW (ref 13.0–17.0)
RBC: 3.94 MIL/uL — ABNORMAL LOW (ref 4.22–5.81)
WBC: 14.7 10*3/uL — ABNORMAL HIGH (ref 4.0–10.5)

## 2011-07-24 LAB — PROTIME-INR
INR: 5.17 (ref 0.00–1.49)
Prothrombin Time: 48.4 seconds — ABNORMAL HIGH (ref 11.6–15.2)

## 2011-07-24 LAB — MAGNESIUM: Magnesium: 2.5 mg/dL (ref 1.5–2.5)

## 2011-07-24 LAB — APTT: aPTT: 93 seconds — ABNORMAL HIGH (ref 24–37)

## 2011-07-25 ENCOUNTER — Inpatient Hospital Stay (HOSPITAL_COMMUNITY): Payer: Medicare Other

## 2011-07-25 LAB — POCT I-STAT 3, ART BLOOD GAS (G3+)
O2 Saturation: 99 %
Patient temperature: 37
TCO2: 25 mmol/L (ref 0–100)

## 2011-07-25 LAB — BLOOD GAS, ARTERIAL
Bicarbonate: 24.9 mEq/L — ABNORMAL HIGH (ref 20.0–24.0)
PEEP: 5 cmH2O
pH, Arterial: 7.424 (ref 7.350–7.450)
pO2, Arterial: 94.8 mmHg (ref 80.0–100.0)

## 2011-07-25 LAB — MAGNESIUM: Magnesium: 2.5 mg/dL (ref 1.5–2.5)

## 2011-07-25 LAB — GLUCOSE, CAPILLARY
Glucose-Capillary: 150 mg/dL — ABNORMAL HIGH (ref 70–99)
Glucose-Capillary: 161 mg/dL — ABNORMAL HIGH (ref 70–99)
Glucose-Capillary: 162 mg/dL — ABNORMAL HIGH (ref 70–99)

## 2011-07-25 LAB — COMPREHENSIVE METABOLIC PANEL
ALT: 74 U/L — ABNORMAL HIGH (ref 0–53)
Albumin: 2.5 g/dL — ABNORMAL LOW (ref 3.5–5.2)
Alkaline Phosphatase: 221 U/L — ABNORMAL HIGH (ref 39–117)
Potassium: 3.8 mEq/L (ref 3.5–5.1)
Sodium: 137 mEq/L (ref 135–145)
Total Protein: 6 g/dL (ref 6.0–8.3)

## 2011-07-25 LAB — RENAL FUNCTION PANEL
Albumin: 2.2 g/dL — ABNORMAL LOW (ref 3.5–5.2)
BUN: 23 mg/dL (ref 6–23)
Creatinine, Ser: 0.94 mg/dL (ref 0.50–1.35)
Phosphorus: 2.2 mg/dL — ABNORMAL LOW (ref 2.3–4.6)

## 2011-07-25 LAB — PROTIME-INR
INR: 4.73 — ABNORMAL HIGH (ref 0.00–1.49)
Prothrombin Time: 45.1 seconds — ABNORMAL HIGH (ref 11.6–15.2)

## 2011-07-25 LAB — CBC
Hemoglobin: 11.6 g/dL — ABNORMAL LOW (ref 13.0–17.0)
RBC: 4.05 MIL/uL — ABNORMAL LOW (ref 4.22–5.81)

## 2011-07-26 ENCOUNTER — Inpatient Hospital Stay (HOSPITAL_COMMUNITY): Payer: Medicare Other

## 2011-07-26 ENCOUNTER — Encounter (HOSPITAL_COMMUNITY): Payer: Self-pay | Admitting: Radiology

## 2011-07-26 DIAGNOSIS — I5021 Acute systolic (congestive) heart failure: Secondary | ICD-10-CM

## 2011-07-26 DIAGNOSIS — I82409 Acute embolism and thrombosis of unspecified deep veins of unspecified lower extremity: Secondary | ICD-10-CM

## 2011-07-26 DIAGNOSIS — J9600 Acute respiratory failure, unspecified whether with hypoxia or hypercapnia: Secondary | ICD-10-CM

## 2011-07-26 DIAGNOSIS — Z9911 Dependence on respirator [ventilator] status: Secondary | ICD-10-CM

## 2011-07-26 DIAGNOSIS — N170 Acute kidney failure with tubular necrosis: Secondary | ICD-10-CM

## 2011-07-26 LAB — RENAL FUNCTION PANEL
Albumin: 2.2 g/dL — ABNORMAL LOW (ref 3.5–5.2)
BUN: 26 mg/dL — ABNORMAL HIGH (ref 6–23)
Calcium: 8.5 mg/dL (ref 8.4–10.5)
Chloride: 103 mEq/L (ref 96–112)
Creatinine, Ser: 0.92 mg/dL (ref 0.50–1.35)
GFR calc Af Amer: >90 mL/min (ref >90)
GFR calc Af Amer: >90 mL/min (ref >90)
GFR calc non Af Amer: 90 mL/min — ABNORMAL LOW (ref >90)
Glucose, Bld: 152 mg/dL — ABNORMAL HIGH (ref 70–99)
Phosphorus: 1.8 mg/dL — ABNORMAL LOW (ref 2.3–4.6)
Phosphorus: 2.7 mg/dL (ref 2.3–4.6)
Potassium: 3.7 mEq/L (ref 3.5–5.1)
Sodium: 136 mEq/L (ref 135–145)
Sodium: 137 mEq/L (ref 135–145)

## 2011-07-26 LAB — CBC
Hemoglobin: 11.4 g/dL — ABNORMAL LOW (ref 13.0–17.0)
MCHC: 31.8 g/dL (ref 30.0–36.0)
Platelets: 89 10*3/uL — ABNORMAL LOW (ref 150–400)
RBC: 3.95 MIL/uL — ABNORMAL LOW (ref 4.22–5.81)

## 2011-07-26 LAB — GLUCOSE, CAPILLARY
Glucose-Capillary: 128 mg/dL — ABNORMAL HIGH (ref 70–99)
Glucose-Capillary: 166 mg/dL — ABNORMAL HIGH (ref 70–99)
Glucose-Capillary: 170 mg/dL — ABNORMAL HIGH (ref 70–99)

## 2011-07-26 LAB — PROTIME-INR
INR: 4.71 — ABNORMAL HIGH (ref 0.00–1.49)
Prothrombin Time: 45 seconds — ABNORMAL HIGH (ref 11.6–15.2)

## 2011-07-26 LAB — CORTISOL: Cortisol, Plasma: 27 ug/dL

## 2011-07-26 LAB — CLOSTRIDIUM DIFFICILE BY PCR: Toxigenic C. Difficile by PCR: POSITIVE — AB

## 2011-07-27 ENCOUNTER — Inpatient Hospital Stay (HOSPITAL_COMMUNITY): Payer: Medicare Other

## 2011-07-27 DIAGNOSIS — L02419 Cutaneous abscess of limb, unspecified: Secondary | ICD-10-CM

## 2011-07-27 DIAGNOSIS — I5021 Acute systolic (congestive) heart failure: Secondary | ICD-10-CM

## 2011-07-27 DIAGNOSIS — Z9911 Dependence on respirator [ventilator] status: Secondary | ICD-10-CM

## 2011-07-27 DIAGNOSIS — L03119 Cellulitis of unspecified part of limb: Secondary | ICD-10-CM

## 2011-07-27 DIAGNOSIS — J9600 Acute respiratory failure, unspecified whether with hypoxia or hypercapnia: Secondary | ICD-10-CM

## 2011-07-27 DIAGNOSIS — N170 Acute kidney failure with tubular necrosis: Secondary | ICD-10-CM

## 2011-07-27 LAB — CBC
HCT: 35.6 % — ABNORMAL LOW (ref 39.0–52.0)
MCV: 89.7 fL (ref 78.0–100.0)
RBC: 3.97 MIL/uL — ABNORMAL LOW (ref 4.22–5.81)
RDW: 20 % — ABNORMAL HIGH (ref 11.5–15.5)
WBC: 19.2 10*3/uL — ABNORMAL HIGH (ref 4.0–10.5)

## 2011-07-27 LAB — RENAL FUNCTION PANEL
Albumin: 2.3 g/dL — ABNORMAL LOW (ref 3.5–5.2)
BUN: 27 mg/dL — ABNORMAL HIGH (ref 6–23)
Calcium: 8.4 mg/dL (ref 8.4–10.5)
Calcium: 8.5 mg/dL (ref 8.4–10.5)
GFR calc Af Amer: >90 mL/min (ref >90)
GFR calc Af Amer: >90 mL/min (ref >90)
GFR calc non Af Amer: >90 mL/min (ref >90)
Glucose, Bld: 161 mg/dL — ABNORMAL HIGH (ref 70–99)
Glucose, Bld: 112 mg/dL — ABNORMAL HIGH (ref 70–99)
Phosphorus: 2.3 mg/dL (ref 2.3–4.6)
Phosphorus: 2.3 mg/dL (ref 2.3–4.6)
Potassium: 3.8 mEq/L (ref 3.5–5.1)
Sodium: 134 mEq/L — ABNORMAL LOW (ref 135–145)
Sodium: 137 mEq/L (ref 135–145)

## 2011-07-27 LAB — MAGNESIUM: Magnesium: 2.4 mg/dL (ref 1.5–2.5)

## 2011-07-27 LAB — GLUCOSE, CAPILLARY
Glucose-Capillary: 144 mg/dL — ABNORMAL HIGH (ref 70–99)
Glucose-Capillary: 199 mg/dL — ABNORMAL HIGH (ref 70–99)
Glucose-Capillary: 111 mg/dL — ABNORMAL HIGH (ref 70–99)

## 2011-07-27 LAB — CULTURE, BLOOD (ROUTINE X 2)
Culture  Setup Time: 201210242102
Culture: NO GROWTH

## 2011-07-27 LAB — DIFFERENTIAL
Basophils Relative: 0 % (ref 0–1)
Lymphocytes Relative: 4 % — ABNORMAL LOW (ref 12–46)
Monocytes Relative: 6 % (ref 3–12)
Neutro Abs: 17 10*3/uL — ABNORMAL HIGH (ref 1.7–7.7)
Neutrophils Relative %: 90 % — ABNORMAL HIGH (ref 43–77)

## 2011-07-28 ENCOUNTER — Inpatient Hospital Stay (HOSPITAL_COMMUNITY): Payer: Medicare Other

## 2011-07-28 LAB — RENAL FUNCTION PANEL
BUN: 23 mg/dL (ref 6–23)
CO2: 25 mEq/L (ref 19–32)
Calcium: 8.7 mg/dL (ref 8.4–10.5)
Chloride: 101 mEq/L (ref 96–112)
GFR calc Af Amer: >90 mL/min (ref >90)
Glucose, Bld: 77 mg/dL (ref 70–99)
Glucose, Bld: 160 mg/dL — ABNORMAL HIGH (ref 70–99)
Phosphorus: 2.3 mg/dL (ref 2.3–4.6)
Phosphorus: 2.5 mg/dL (ref 2.3–4.6)
Potassium: 4.2 mEq/L (ref 3.5–5.1)
Sodium: 136 mEq/L (ref 135–145)

## 2011-07-28 LAB — GLUCOSE, CAPILLARY
Glucose-Capillary: 110 mg/dL — ABNORMAL HIGH (ref 70–99)
Glucose-Capillary: 144 mg/dL — ABNORMAL HIGH (ref 70–99)
Glucose-Capillary: 171 mg/dL — ABNORMAL HIGH (ref 70–99)
Glucose-Capillary: 92 mg/dL (ref 70–99)
Glucose-Capillary: 98 mg/dL (ref 70–99)

## 2011-07-28 LAB — CBC
MCH: 28.4 pg (ref 26.0–34.0)
MCHC: 30.8 g/dL (ref 30.0–36.0)
Platelets: 83 10*3/uL — ABNORMAL LOW (ref 150–400)
RDW: 20.5 % — ABNORMAL HIGH (ref 11.5–15.5)

## 2011-07-28 LAB — MAGNESIUM: Magnesium: 2.4 mg/dL (ref 1.5–2.5)

## 2011-07-28 LAB — PROTIME-INR: Prothrombin Time: 43.6 seconds — ABNORMAL HIGH (ref 11.6–15.2)

## 2011-07-28 NOTE — Consult Note (Signed)
[name], [name] NO.:  [id number]  MEDICAL RECORD NO.:  [id number]  LOCATION:  2603                         FACILITY:  [facility]  PHYSICIAN:  Garnetta Buddy, M.D.   DATE OF BIRTH:  1951/08/27  DATE OF CONSULTATION:  07/15/2011 DATE OF DISCHARGE:                                CONSULTATION   This is a 60 year old white male with a history of urinary retention in the past, baseline serum creatinine 2.0, history of cardiomyopathy, poor ejection fraction, was recently discharged to nursing facility due to functional decline and exacerbation of his congestive heart failure.  He has a history of CABG in the past.  He presents back to [facility] Emergency Room with increasing dyspnea with conversation, diarrhea, loose stools, and general fatigue.  PAST MEDICAL HISTORY: 1. History of coronary artery disease, status post RCA stent in 2007. 2. History of CABG on June 12, [facility]. 3. History of congestive heart failure with ejection fraction of 25%. 4. History of tobacco abuse. 5. History of hypertension. 6. History of hyperlipidemia. 7. History of diabetes. 8. History of sleep apnea. 9. History of DVT, chronic Coumadin therapy. 10.History of obesity. 11.History of anxiety. 12.History of atrial fibrillation. 13.History of chronic kidney disease stage III. 14.History of urinary retention. 15.History of pulmonary hypertension. 16.History of COPD. 17.History of hypothyroidism. 18.History of anemia.  PAST SURGICAL HISTORY: 1. History of Fournier gangrene. 2. History of spinal fusion. 3. Tonsillectomy. 4. History of CABG on June 12, at [facility].  MEDICATIONS: 1. Insulin 45 units daily. 2. Warfarin 4 mg daily. 3. Spironolactone 25 mg daily. 4. Carvedilol 3.125 mg daily. 5. Flomax 0.4 mg daily. 6. Zoloft 100 mg daily. 7. Zantac 150 mg daily. 8. Morphine 5 mg daily. 9. Synthroid 175 mcg daily. 10.Atrovent inhaler. 11.Regular  insulin. 12.Lasix 80 mg daily. 13.Proscar 5 mg daily. 14.Aspirin 81 mg daily. 15.Albuterol inhaler.  ALLERGIES:  ACE INHIBITORS causing cough, ACTOS causing swelling.  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  No alcohol.  History of tobacco use, 50 plus years.  REVIEW OF SYSTEMS:  GENERAL:  Fatigued, weakness.  No fevers or chills. EYES:  No visual loss.  EARS, NOSE, MOUTH, AND THROAT:  No hearing loss, epistaxis, or sore throat.  CARDIOVASCULAR:  No angina.  No complaints of orthopnea.  Unable to lie with less than 3 pillows, lower extremity edema, dyspnea on exertion which is minimal.  No palpitations or syncopal spells, history of atrial fibrillation.  RESPIRATORY: Shortness of breath.  No cough, sputum.  No fever, chills.  ABDOMINAL: No abdominal pain.  History of nausea and vomiting today.  Complaints of some loose bowel movements.  UROGENITAL:  Denies urgency, frequency, dysuria, nocturia, or hematuria.  No history of renal calculi, but history of urinary retention, history of benign prostatic hypertrophy with use of Proscar and Flomax.  NEUROLOGIC:  No strokes, seizures. Denies numbness or tingling.  MUSCULOSKELETAL:  No use of nonsteroidal anti-inflammatory drugs.  Denies any pain in joints or joint swelling. DERMATOLOGIC:  No skin rash, no itching.  HEMATOLOGIC/ONCOLOGIC:  No history of cancer.  There is a history of venous stasis  with chronic Coumadin therapy.  PHYSICAL EXAMINATION:  GENERAL:  Alert, pleasant gentleman, sitting in bed, dyspnea with conversation. VITAL SIGNS:  Blood pressure 110/70, pulse 70, afebrile.  O2 sats 97% on 2 L. HEAD AND EYES:  Normocephalic and atraumatic.  Pupils are round, equal, and reactive.  Extraocular movements intact. EARS, NOSE, MOUTH, AND THROAT:  External appearance is normal.  Nasal mucosa is clear.  Oropharynx is clear. NECK:  Supple.  JVP mildly elevated.  No carotid bruits.  No thyromegaly.  No  lymphadenopathy. CARDIOVASCULAR:  Appeared to be regular.  A 2/6 systolic murmur.  No rubs, no gallops. RESPIRATORY:  Diminished breath sounds to mid zones. ABDOMEN:  Soft, distended.  Hyperactive bowel sounds. EXTREMITIES:  A 3+ edema with compression stockings.  LABS:  WBC 7.5, hemoglobin 11.8, and platelets 165.  Sodium 138, potassium 5.7, chloride 101, CO2 19, BUN 139, creatinine 4.8, glucose 171.  Chest x-ray, pulmonary congestion.  ASSESSMENT AND PLAN: 1. Acute-on-chronic renal failure.  I suspect the patient has     obstruction.  We recommend Foley catheter, renal ultrasound,     urinalysis, microscopy, and follow labs. 2. Hypertension, volume on congestive heart failure and edema.  I     agree with aggressive diuresis.  We will     change to Lasix 160 mg q.8 h. 3. Anemia, stable. 4. Hyperkalemic.  Place Foley, daily weights, Is and Os at this     present time.  Avoid spironolactone therapy.     Garnetta Buddy, M.D.     MWW/MEDQ  D:  07/15/2011  T:  07/16/2011  Job:  [id number]  Electronically Signed by Elvis Coil M.D. on 07/28/2011 10:19:08 AM

## 2011-07-28 NOTE — Consult Note (Signed)
[name], WEISENSEL NO.:  [id number]  MEDICAL RECORD NO.:  [id number]  LOCATION:                                 FACILITY:  PHYSICIAN:  Juan Sella. Andrey Campanile, MD     DATE OF BIRTH:  03-Jul-1951  DATE OF CONSULTATION:  07/27/2011 DATE OF DISCHARGE:                                CONSULTATION   REQUESTING PHYSICIAN:  Dr. Darrol Angel.  CONSULTING SURGEON:  Juan Sella. Andrey Campanile, MD  REASON FOR CONSULTATION:  Right thigh cellulitis.  HISTORY OF PRESENT ILLNESS:  [name] is a 60 year old gentleman who has multiple medical problems including acute-on-chronic renal failure, requiring active dialysis, coronary artery disease status post CABG earlier this year, ischemic cardiomyopathy with pulmonary edema, respiratory failure, now status post extubation this morning.  He was admitted about 12 days ago with complaint of shortness of breath and was diagnosed with the above-mentioned acute renal failure, respiratory failure, congestive heart failure, and atrial fibrillation on chronic Coumadin therapy.  He also has a history of DVTs and requiring chronic Coumadin anticoagulation.  He throughout his hospitalization here has now developed worsening anasarca and a subsequent right lower extremity cellulitis and swelling.  Apparently about 8 days ago, a CT scan was ordered of his thigh due to the cellulitis and showed subcutaneous swelling and edema and no evidence of fluid collection or abscess at that point.  However, he has not really responded to the antibiotic therapy, and a repeat CT scan performed yesterday showed evidence of fluid collection in the right lateral thigh concerning for deeper abscess.  Surgical consult is now requested to assess this for possible intervention.  PAST MEDICAL HISTORY:  Again significant for: 1. Acute-on-chronic systolic heart failure with a known ejection     fraction of 25-30%. 2. History of coronary artery disease with recent coronary artery   bypass grafting at [facility] back in June. 3. Chronic atrial fibrillation. 4. History of DVT. 5. Hypertension. 6. Hyperlipidemia. 7. Diabetes mellitus. 8. COPD. 9. Stage III chronic kidney disease. 10.Hypothyroidism. 11.Obstructive sleep apnea. 12.Osteoarthritis. 13.Prior history of Fournier gangrene.  PAST SURGICAL HISTORY:  As mentioned CABG in June 2012.  FAMILY HISTORY:  Noncontributory to present case.  SOCIAL HISTORY:  The patient was previously residing in a skilled nursing facility recovering from his heart surgery.  He denies any current use of alcohol or tobacco but states he quit smoking in May 2012.  MEDICATIONS:  Please see medication reconciliation list for accurate list of present medications.  ALLERGIES:  ACTOS and ALTACE.  REVIEW OF SYSTEMS:  Please see history of present illness for pertinent findings.  PHYSICAL EXAMINATION:  GENERAL:  A 60 year old Caucasian male, who is awake and alert and oriented x3.  He does not appear toxic at present time. CURRENT VITAL SIGNS:  Temperature of 96, heart rate of 91, blood pressure of 91/69, respiratory rate of 22 on 4 L nasal cannula. ABDOMEN AND EXTREMITIES:  Exam was limited to the abdomen and extremities.  The abdomen demonstrates generalized anasarca but is nontender.  Examination of the left lower extremity reveals 2 to 3+ pitting edema consistent with anasarca.  There is no erythema.  The patient  has multiple scabs of the left lower extremity from unknown skin lesions.  The right lower extremity demonstrates 3 to 4+ pitting edema extending all the way from the proximal thigh all the way to the foot. He has palpable femoral pulses.  In the anterolateral thigh, there is diffuse erythema that intensifies from pale erythema to deep erythema sort of the more laterally you get.  There is no fluctuance to suggest underlying superficial abscess.  There is no subcu emphysema palpable. The patient is quite tender with  palpation of this region.  No overlying superficial skin lesions to suggest a nidus of infection.  LABORATORY DATA:  A white blood cell count today shows a 19.2; hemoglobin of 11.4; hematocrit of 35.6; platelets have gradually trended down, currently at 80.  INR is currently elevated at 5.1.  Metabolic panel shows a sodium of 134, potassium 3.8, chloride of 100, CO2 of 27, BUN of 28, creatinine of 0.87, glucose of 161, magnesium 2.4, phosphate of 2.3, albumin of 2.3.  IMAGING:  CT scan again yesterday shows extensive infiltration of subcutaneous fat, fluid collection subjacent to the superficial fascia of the vastus lateralis and rectus femoris measuring 8 x 1 x 20 cm with focally intense edema superficial to this region.  No intramuscular fluid collection or gas was identified.  No bony destruction to suggest osteomyelitis was identified.  IMPRESSION: 1. Right thigh cellulitis with possible deep tissue abscess/fluid     collection. 2. Multiple comorbid medical problems as outlined, currently under     continued treatment.  However, these multiple medical issues     including heart failure, protein-calorie malnutrition, and     supratherapeutic INR make the patient a very high-risk candidate     for surgical intervention.  Therefore, at this point, we feel that     perhaps Interventional Radiology may be to do a percutaneous drain     or aspiration of this fluid collection, possibly by means of     ultrasound guidance.  If this yields a significant amount of     purulence or pus suggestive of deep tissue abscess, then certainly     the patient may then require open incision and drainage.  However,     he would need aggressive reversal of anticoagulation by means of     fresh frozen plasma or vitamin K, as well as platelet     infusion due to thrombocytopenia.  Hopefully with antibiotics and     aspiration, this may continue to improve.  We will certainly follow     along with case  and participate in his care.  The patient was seen     with and plan implemented by Dr. Gaynelle Graham.     Juan El, PA-C   ______________________________ Juan Sella. Andrey Campanile, MD    KB/MEDQ  D:  07/27/2011  T:  07/27/2011  Job:  [id number]  cc:   Dr. Darrol Angel  Electronically Signed by [name]  on 07/28/2011 03:24:03 PM Electronically Signed by [name] M.D. on 07/28/2011 03:35:28 PM

## 2011-07-29 DIAGNOSIS — N170 Acute kidney failure with tubular necrosis: Secondary | ICD-10-CM

## 2011-07-29 DIAGNOSIS — A419 Sepsis, unspecified organism: Secondary | ICD-10-CM

## 2011-07-29 DIAGNOSIS — J9600 Acute respiratory failure, unspecified whether with hypoxia or hypercapnia: Secondary | ICD-10-CM

## 2011-07-29 DIAGNOSIS — I5021 Acute systolic (congestive) heart failure: Secondary | ICD-10-CM

## 2011-07-29 LAB — RENAL FUNCTION PANEL
BUN: 22 mg/dL (ref 6–23)
Chloride: 103 mEq/L (ref 96–112)
Glucose, Bld: 93 mg/dL (ref 70–99)
Phosphorus: 2 mg/dL — ABNORMAL LOW (ref 2.3–4.6)
Potassium: 4.3 mEq/L (ref 3.5–5.1)

## 2011-07-29 LAB — GLUCOSE, CAPILLARY: Glucose-Capillary: 142 mg/dL — ABNORMAL HIGH (ref 70–99)

## 2011-07-29 LAB — CBC
HCT: 36.5 % — ABNORMAL LOW (ref 39.0–52.0)
MCHC: 31 g/dL (ref 30.0–36.0)
Platelets: 104 10*3/uL — ABNORMAL LOW (ref 150–400)
RDW: 20.2 % — ABNORMAL HIGH (ref 11.5–15.5)

## 2011-07-29 LAB — PROTIME-INR: INR: 4.73 — ABNORMAL HIGH (ref 0.00–1.49)

## 2011-07-30 ENCOUNTER — Inpatient Hospital Stay (HOSPITAL_COMMUNITY): Payer: Medicare Other

## 2011-07-30 DIAGNOSIS — D684 Acquired coagulation factor deficiency: Secondary | ICD-10-CM

## 2011-07-30 DIAGNOSIS — I5023 Acute on chronic systolic (congestive) heart failure: Secondary | ICD-10-CM

## 2011-07-30 LAB — CARBOXYHEMOGLOBIN
Carboxyhemoglobin: 1.7 % — ABNORMAL HIGH (ref 0.5–1.5)
Carboxyhemoglobin: 1.7 % — ABNORMAL HIGH (ref 0.5–1.5)
Methemoglobin: 0.6 % (ref 0.0–1.5)
O2 Saturation: 57.1 %
Total hemoglobin: 11 g/dL — ABNORMAL LOW (ref 13.5–18.0)

## 2011-07-30 LAB — RENAL FUNCTION PANEL
Albumin: 2.4 g/dL — ABNORMAL LOW (ref 3.5–5.2)
Calcium: 8.8 mg/dL (ref 8.4–10.5)
Creatinine, Ser: 1.43 mg/dL — ABNORMAL HIGH (ref 0.50–1.35)
GFR calc non Af Amer: 51 mL/min — ABNORMAL LOW (ref >90)

## 2011-07-30 LAB — GLUCOSE, CAPILLARY
Glucose-Capillary: 112 mg/dL — ABNORMAL HIGH (ref 70–99)
Glucose-Capillary: 251 mg/dL — ABNORMAL HIGH (ref 70–99)

## 2011-07-30 MED ORDER — SODIUM CHLORIDE 0.9 % IJ SOLN
3.0000 mL | Freq: Two times a day (BID) | INTRAMUSCULAR | Status: DC
  Filled 2011-07-30 (×4): qty 3

## 2011-07-30 MED ORDER — METRONIDAZOLE IN NACL 5-0.79 MG/ML-% IV SOLN
500.0000 mg | Freq: Three times a day (TID) | INTRAVENOUS | Status: DC
  Administered 2011-08-01 – 2011-08-05 (×12): 500 mg via INTRAVENOUS
  Filled 2011-07-30 (×20): qty 100

## 2011-07-30 MED ORDER — PROMETHAZINE HCL 25 MG RE SUPP: 25.0000 mg | Freq: Two times a day (BID) | RECTAL | Status: DC | PRN

## 2011-07-30 MED ORDER — NOREPINEPHRINE BITARTRATE 1 MG/ML IJ SOLN
2.0000 ug/min | INTRAVENOUS | Status: DC
  Filled 2011-07-30: qty 16

## 2011-07-30 MED ORDER — CAMPHOR-MENTHOL 0.5-0.5 % EX LOTN: TOPICAL_LOTION | CUTANEOUS | Status: DC | PRN

## 2011-07-30 MED ORDER — ACETAMINOPHEN 650 MG RE SUPP: 650.0000 mg | Freq: Four times a day (QID) | RECTAL | Status: DC | PRN

## 2011-07-30 MED ORDER — CHLORHEXIDINE GLUCONATE 0.12 % MT SOLN
15.0000 mL | Freq: Two times a day (BID) | OROMUCOSAL | Status: DC
  Administered 2011-08-01 – 2011-08-02 (×4): 15 mL via OROMUCOSAL
  Filled 2011-07-30 (×5): qty 15

## 2011-07-30 MED ORDER — HALOPERIDOL LACTATE 5 MG/ML IJ SOLN: 2.0000 mg | INTRAMUSCULAR | Status: DC | PRN

## 2011-07-30 MED ORDER — PROSOURCE NO CARB PO LIQD
30.0000 mL | Freq: Two times a day (BID) | ORAL | Status: DC
  Administered 2011-08-01 – 2011-08-07 (×9): 30 mL via ORAL
  Filled 2011-07-30 (×22): qty 30

## 2011-07-30 MED ORDER — BIOTENE DRY MOUTH MT LIQD
15.0000 mL | Freq: Four times a day (QID) | OROMUCOSAL | Status: DC
  Administered 2011-08-01 – 2011-08-08 (×21): 15 mL via OROMUCOSAL

## 2011-07-30 MED ORDER — MORPHINE SULFATE 2 MG/ML IJ SOLN
2.0000 mg | INTRAMUSCULAR | Status: DC | PRN
  Administered 2011-08-01: 4 mg via INTRAVENOUS

## 2011-07-30 MED ORDER — LEVOTHYROXINE SODIUM 75 MCG PO TABS
75.0000 ug | ORAL_TABLET | Freq: Every day | ORAL | Status: DC
  Filled 2011-07-30 (×3): qty 1

## 2011-07-30 MED ORDER — SODIUM CHLORIDE 0.9 % IV SOLN
500.0000 mg | Freq: Three times a day (TID) | INTRAVENOUS | Status: DC
  Filled 2011-07-30 (×4): qty 500

## 2011-07-30 MED ORDER — ONDANSETRON HCL 4 MG/2ML IJ SOLN: 4.0000 mg | Freq: Four times a day (QID) | INTRAMUSCULAR | Status: DC | PRN

## 2011-07-30 MED ORDER — DEXTROSE 5 % IV SOLN
2.0000 g | INTRAVENOUS | Status: DC
  Filled 2011-07-30: qty 2

## 2011-07-30 MED ORDER — HEPARIN SODIUM (PORCINE) 1000 UNIT/ML IJ SOLN: 1000.0000 [IU] | INTRAMUSCULAR | Status: DC | PRN

## 2011-07-30 MED ORDER — ENSURE CLINICAL ST REVIGOR PO LIQD
237.0000 mL | Freq: Two times a day (BID) | ORAL | Status: DC
  Administered 2011-08-01 – 2011-08-05 (×6): 237 mL via ORAL
  Administered 2011-08-05 – 2011-08-06 (×2): via ORAL
  Administered 2011-08-07: 237 mL via ORAL

## 2011-07-30 MED ORDER — INSULIN ASPART 100 UNIT/ML ~~LOC~~ SOLN
0.0000 [IU] | Freq: Three times a day (TID) | SUBCUTANEOUS | Status: DC
  Administered 2011-08-01: 2 [IU] via SUBCUTANEOUS
  Administered 2011-08-01: 3 [IU] via SUBCUTANEOUS
  Administered 2011-08-02: 2 [IU] via SUBCUTANEOUS
  Administered 2011-08-02 – 2011-08-03 (×3): 1 [IU] via SUBCUTANEOUS
  Administered 2011-08-03: 3 [IU] via SUBCUTANEOUS
  Administered 2011-08-03: 2 [IU] via SUBCUTANEOUS
  Administered 2011-08-04: 1 [IU] via SUBCUTANEOUS
  Administered 2011-08-04 – 2011-08-07 (×2): 2 [IU] via SUBCUTANEOUS
  Filled 2011-07-30 (×2): qty 3

## 2011-07-30 MED ORDER — BIOTENE DRY MOUTH MT LIQD: 15.0000 mL | Freq: Two times a day (BID) | OROMUCOSAL | Status: DC

## 2011-07-31 ENCOUNTER — Inpatient Hospital Stay (HOSPITAL_COMMUNITY): Payer: Medicare Other

## 2011-07-31 ENCOUNTER — Other Ambulatory Visit: Payer: Self-pay

## 2011-07-31 DIAGNOSIS — R6520 Severe sepsis without septic shock: Secondary | ICD-10-CM | POA: Diagnosis present

## 2011-07-31 DIAGNOSIS — A419 Sepsis, unspecified organism: Secondary | ICD-10-CM | POA: Diagnosis present

## 2011-07-31 DIAGNOSIS — N179 Acute kidney failure, unspecified: Secondary | ICD-10-CM | POA: Diagnosis present

## 2011-07-31 LAB — PREPARE FRESH FROZEN PLASMA
Unit division: 0
Unit division: 0

## 2011-07-31 LAB — GLUCOSE, CAPILLARY
Glucose-Capillary: 180 mg/dL — ABNORMAL HIGH (ref 70–99)
Glucose-Capillary: 207 mg/dL — ABNORMAL HIGH (ref 70–99)

## 2011-07-31 LAB — CARBOXYHEMOGLOBIN
Carboxyhemoglobin: 1.8 % — ABNORMAL HIGH (ref 0.5–1.5)
Methemoglobin: 0.6 % (ref 0.0–1.5)
Methemoglobin: 0.4 % (ref 0.0–1.5)
O2 Saturation: 53.4 %
Total hemoglobin: 11.6 g/dL — ABNORMAL LOW (ref 13.5–18.0)

## 2011-07-31 LAB — RENAL FUNCTION PANEL
CO2: 24 mEq/L (ref 19–32)
Chloride: 100 mEq/L (ref 96–112)
Creatinine, Ser: 2.17 mg/dL — ABNORMAL HIGH (ref 0.50–1.35)
GFR calc Af Amer: 36 mL/min — ABNORMAL LOW (ref >90)
GFR calc non Af Amer: 31 mL/min — ABNORMAL LOW (ref >90)
Glucose, Bld: 229 mg/dL — ABNORMAL HIGH (ref 70–99)

## 2011-07-31 LAB — CULTURE, ROUTINE-ABSCESS

## 2011-07-31 LAB — APTT: aPTT: 46 seconds — ABNORMAL HIGH (ref 24–37)

## 2011-07-31 LAB — CBC
Hemoglobin: 11.4 g/dL — ABNORMAL LOW (ref 13.0–17.0)
MCH: 28.4 pg (ref 26.0–34.0)
MCHC: 31.5 g/dL (ref 30.0–36.0)
RDW: 20.4 % — ABNORMAL HIGH (ref 11.5–15.5)

## 2011-07-31 LAB — MAGNESIUM: Magnesium: 2.5 mg/dL (ref 1.5–2.5)

## 2011-07-31 MED ORDER — DEXTROSE 5 % IV SOLN
0.5000 mg/min | INTRAVENOUS | Status: DC
  Filled 2011-07-31: qty 9

## 2011-07-31 MED ORDER — MILRINONE IN DEXTROSE 200-5 MCG/ML-% IV SOLN
0.0625 ug/kg/min | INTRAVENOUS | Status: DC
  Administered 2011-08-01 – 2011-08-03 (×4): 0.125 ug/kg/min via INTRAVENOUS
  Filled 2011-07-31 (×4): qty 100

## 2011-07-31 MED ORDER — DEXTROSE 5 % IV SOLN
2.0000 g | INTRAVENOUS | Status: DC
  Filled 2011-07-31: qty 2

## 2011-07-31 NOTE — Progress Notes (Signed)
Patient Description Brief Note:  60-male hx of CAD w/ prev. CABG 02/2011, chronic kidney dz (baseline 1.99) . Admitted 10/18 with progressively worsenng dyspnea, found to have scr 5.3. Recent discharge June 29, 2011 with acute on chronic systolic heart failure. On 10/21 he developed acute resp distress. PCCM asked to evaluate and assume his care from [facility] 10/21.  Lines and tubes: 10/22 ETT>>>10/24 10/26 ETT>> 10/30 10/21 R IJ HD cath >> 10/22 L IJ TLC >>  Micro: 10/18 MRSA PCR - POSITIVE 10/18 Uc>>NEG 10/21 BC>Klebsiella 10/24 BC>>NEG 10/29 C.diff >>POSITIVE 10/31 thigh aspirate >> klebs oxytoca S to ceftx  Abx: 10/22 Vanco>> 10/24 10/22 Zosyn>> 10/24 10/24 Ceftriaxone>> (stop date 08/02/11) 10/29 Flagyl>> (stop date 08/07/11)  Protocols/consults Renal [place]: 10/21 Acute resp distress tx to icu; HD started 10/22 GNB in blood >>Zosyn started; aspiratio/respiratory distress requiring intubation 10/24 Extubated. Klebsiella in blood, ABX narrowed 10/25 Worsening delirium with loss of orientation, hallucinations and agitation. 10/26 Respiratory distress, altered mental status, unable to protect airway>>>intubated 10/29 Repeat LE venous dopplers: negative. Repeat R thigh CT w/o contrast: new fluid in the adjacent fascia along the lateral aspect of the thigh. Pt required low dose levophed for a few hours. 10/30 Extubated to 4L [place]. 10/31: CT guided drainage of thigh abscess by IR 11/2 >> Sister decided against risk of surgery HPI/Events of Note afebrile, more confused 11/2 unable to finish sentences due to dyspnea  Past History NEURO-anxiety CVASC-CHF; hypertension requiring treatment; CABG - within 6 months; DVT - date unknown; Echo: 02/24/2011 Ef 25-30%; Arrhythmias: atrial fibrillation - chronic; left bundle branch block PULM-COPD - severe; OSA RENAL-renal insufficiency - creatinine 2-3; ATN post-op 02/2011 ID-MSSA of CABG site 04/2011, fournier  gangrene ENDO-Non-Insulin Dependent Diabetes: medication dependent; hypothyroidism RHEUM-osteoarthritis  Physical Exam HR-122, sinus, regular, narrow complex, (range 108-138), BP-98/74 (range 92-135/58-121), Temp-36.1(C) (range 36.1(C)-37.3(C)) Resp Rate-28, spontaneous, (range 25-35), O2 Sat%-94 (range 92-99) Weight: admission-115.9; I&O: intake-1095, output-107, Urine-107, dialysis net (0), Total net (+988) Neuro: alert, appropriate, following commands, confused, oriented x 2 HEENT:[place] in place, MMM Cardiovascular: Tachycardic, regular rhythm Chest / Lungs: Bilateral diminished breath sounds, otherwise clear Abdomen: Obese, abdominal wall edema. Extremities: BL edema R>L. R thigh blanching erythema, swelling and induration that spreads to posterior portion of thigh NEURO-GCS: M-6, V-5, E-4  Abnormal Hgb - 11.4, Hct - 36.2, WBC - 12.5, PTT - 46 K - 5.5, Glu - 229, BUN - 58, Cr - 2.17, albumin - 2.5 Normal Plts - 151 Na - 135, Cl - 100, HCO3 - 24, Ca - 9.0, Mg - 2.5, Phos - 3.4 ABG O2Sat % - 46.2  Medications reviewed  ASSESSMENT Diagnoses PULM: ACUTE RESPIRATORY FAILURE PULMONARY ASPIRATION Extubated 10/30. -Supplemental O2 as needed (now on 3 L [place])  CVASC: HYPOTENSION CONGESTIVE HEART FAILURE-LEFT-SIDED(EF < 25%); SYSTOLIC(ACUTE ON CHRONIC) ACUTE PULMONARY EDEMA Off pressors, off cvvhd. -Unable to use beta-blockers / ace due to marginal BP -fluid removal with intermittent dialysis, plan today -doubt inotropes of benefit here  RENAL: ACUTE RENAL FAILURE CHRONIC RENAL INSUFFICIENCY Anuric -renal following.HD today. HEME: COAGULOPATHY-COUMADIN ADMINISTRATION H/o DVT and CHF. INR supratherapeutic at admission, now down to 2.4. No active bleeding. Surgery deferred - ct to hold coumadin  ID: [email or web address] DUE TO INFECTION-C. DIFFICILE COLITIS(LAB CONFIRMED) CELLULITIS BACTEREMIA-GRAM NEGATIVE ROD Klebsiella in blood 10/21& R thigh abscess on aspirate 10/31. Pt afebrile and  wbc trending down. -Cont Flagyl for C. diff -resume ceftx again (stop primaxin since kelbs remains sens) ENDO: HYPERGLYCEMIA  HYPOTHYROIDISM CBG's at goal, transition  to standard sensitive SSI protocol, monitor CBG's NEURO: delirium haldol prn GI: protein-calorie malnutrition diarrhea C.diff colitis. Diabetic/renal diet, tolerating well  SYSTEMIC: seen by palliative care consult requested Care limitations set -DNR - no pressors , no CVVH Will increase level of palliation here sister decided against I & D  Global Issues Transfer to SDU  The patient is critically ill with renal failure, respiratory failure, severe metabolic derangement(s) and overwhelming infection and requires high complexity decision making for assessment and support including frequent ventilator adjustments, neurological monitoring and treatment and frequent evaluation and titration of therapies; Critical care; Time devoted to patient care services described in this note = 20 minutes face-to-face / 25 minutes total evaluation time; Cumulative time devoted to patient care services by me for day of service: 30 minutes Care during the described time interval was provided by me. I have reviewed this patient's available data, including medical history, events of note, physical examination and test results as part of my evaluation.

## 2011-08-01 LAB — CBC
HCT: 32.1 % — ABNORMAL LOW (ref 39.0–52.0)
Hemoglobin: 10.2 g/dL — ABNORMAL LOW (ref 13.0–17.0)
RBC: 3.64 MIL/uL — ABNORMAL LOW (ref 4.22–5.81)

## 2011-08-01 LAB — BASIC METABOLIC PANEL
Chloride: 98 mEq/L (ref 96–112)
GFR calc Af Amer: 30 mL/min — ABNORMAL LOW (ref >90)
GFR calc non Af Amer: 26 mL/min — ABNORMAL LOW (ref >90)
Glucose, Bld: 243 mg/dL — ABNORMAL HIGH (ref 70–99)
Potassium: 5.3 mEq/L — ABNORMAL HIGH (ref 3.5–5.1)
Sodium: 133 mEq/L — ABNORMAL LOW (ref 135–145)

## 2011-08-01 LAB — CARBOXYHEMOGLOBIN
Carboxyhemoglobin: 1.6 % — ABNORMAL HIGH (ref 0.5–1.5)
Methemoglobin: 0.5 % (ref 0.0–1.5)
Total hemoglobin: 10.5 g/dL — ABNORMAL LOW (ref 13.5–18.0)

## 2011-08-01 LAB — PROTIME-INR: Prothrombin Time: 20.8 seconds — ABNORMAL HIGH (ref 11.6–15.2)

## 2011-08-01 LAB — APTT: aPTT: 37 seconds (ref 24–37)

## 2011-08-01 LAB — HEPATITIS B SURFACE ANTIGEN: Hepatitis B Surface Ag: NEGATIVE

## 2011-08-01 MED ORDER — DEXTROSE 5 % IV SOLN
1.0000 g | INTRAVENOUS | Status: DC
  Administered 2011-08-01 – 2011-08-05 (×5): 1 g via INTRAVENOUS
  Filled 2011-08-01 (×7): qty 10

## 2011-08-01 MED ORDER — HALOPERIDOL LACTATE 5 MG/ML IJ SOLN
2.0000 mg | INTRAMUSCULAR | Status: DC | PRN
  Administered 2011-08-06 (×2): 2 mg via INTRAVENOUS
  Filled 2011-08-01: qty 1

## 2011-08-01 MED ORDER — SODIUM CHLORIDE 0.9 % IJ SOLN
INTRAMUSCULAR | Status: AC
  Filled 2011-08-01: qty 10

## 2011-08-01 MED ORDER — ZOLPIDEM TARTRATE 5 MG PO TABS: 5.0000 mg | ORAL_TABLET | Freq: Every evening | ORAL | Status: DC | PRN

## 2011-08-01 MED ORDER — CALCIUM CARBONATE 1250 MG/5ML PO SUSP
500.0000 mg | Freq: Four times a day (QID) | ORAL | Status: DC | PRN
  Filled 2011-08-01: qty 5

## 2011-08-01 MED ORDER — PROMETHAZINE HCL 12.5 MG PO TABS
12.5000 mg | ORAL_TABLET | Freq: Four times a day (QID) | ORAL | Status: DC | PRN
  Filled 2011-08-01: qty 1

## 2011-08-01 MED ORDER — SODIUM CHLORIDE 0.9 % IV SOLN: 100.0000 mL | INTRAVENOUS | Status: DC | PRN

## 2011-08-01 MED ORDER — MORPHINE SULFATE 4 MG/ML IJ SOLN
INTRAMUSCULAR | Status: AC
  Filled 2011-08-01: qty 1

## 2011-08-01 MED ORDER — SODIUM CHLORIDE 0.9 % IJ SOLN
INTRAMUSCULAR | Status: AC
  Filled 2011-08-01: qty 20

## 2011-08-01 MED ORDER — MORPHINE SULFATE 4 MG/ML IJ SOLN
INTRAMUSCULAR | Status: AC
  Administered 2011-08-01: 2 mg via INTRAVENOUS
  Filled 2011-08-01: qty 1

## 2011-08-01 MED ORDER — LIDOCAINE-PRILOCAINE 2.5-2.5 % EX CREA
1.0000 "application " | TOPICAL_CREAM | CUTANEOUS | Status: DC | PRN
  Filled 2011-08-01: qty 5

## 2011-08-01 MED ORDER — SODIUM CHLORIDE 0.9 % IV SOLN
INTRAVENOUS | Status: DC
  Administered 2011-08-01: 18:00:00 via INTRAVENOUS

## 2011-08-01 MED ORDER — HEPARIN SODIUM (PORCINE) 1000 UNIT/ML DIALYSIS
1000.0000 [IU] | INTRAMUSCULAR | Status: DC | PRN
  Administered 2011-08-02: 1000 [IU] via INTRAVENOUS_CENTRAL
  Filled 2011-08-01: qty 1

## 2011-08-01 MED ORDER — SORBITOL 70 % SOLN
30.0000 mL | Status: DC | PRN
  Filled 2011-08-01: qty 30

## 2011-08-01 MED ORDER — HYDROXYZINE HCL 25 MG PO TABS
25.0000 mg | ORAL_TABLET | Freq: Three times a day (TID) | ORAL | Status: DC | PRN
  Filled 2011-08-01: qty 1

## 2011-08-01 MED ORDER — NEPRO/CARBSTEADY PO LIQD
237.0000 mL | Freq: Three times a day (TID) | ORAL | Status: DC | PRN
  Administered 2011-08-01 – 2011-08-04 (×2): 237 mL via ORAL
  Filled 2011-08-01: qty 237

## 2011-08-01 MED ORDER — ACETAMINOPHEN 325 MG PO TABS
650.0000 mg | ORAL_TABLET | Freq: Four times a day (QID) | ORAL | Status: DC | PRN
  Administered 2011-08-01: 650 mg via ORAL
  Filled 2011-08-01: qty 2

## 2011-08-01 MED ORDER — SODIUM CHLORIDE 0.9 % IV SOLN
INTRAVENOUS | Status: DC
  Administered 2011-08-01: 03:00:00 via INTRAVENOUS

## 2011-08-01 MED ORDER — LIDOCAINE HCL (PF) 1 % IJ SOLN
5.0000 mL | INTRAMUSCULAR | Status: DC | PRN
  Filled 2011-08-01: qty 5

## 2011-08-01 MED ORDER — PENTAFLUOROPROP-TETRAFLUOROETH EX AERO
1.0000 "application " | INHALATION_SPRAY | CUTANEOUS | Status: DC | PRN
  Filled 2011-08-01: qty 103.5

## 2011-08-01 MED ORDER — LEVOTHYROXINE SODIUM 75 MCG PO TABS
75.0000 ug | ORAL_TABLET | Freq: Every day | ORAL | Status: DC
  Administered 2011-08-01 – 2011-08-05 (×5): 75 ug via ORAL
  Filled 2011-08-01 (×10): qty 1

## 2011-08-01 MED ORDER — DOCUSATE SODIUM 283 MG RE ENEM
1.0000 | ENEMA | RECTAL | Status: DC | PRN
  Filled 2011-08-01: qty 1

## 2011-08-01 NOTE — Progress Notes (Signed)
Subjective: Denies any specific complaints  Objective: Weight change: 5.8 kg (12 lb 12.6 oz)  Intake/Output Summary (Last 24 hours) at 08/01/11 1026 Last data filed at 08/01/11 4098  Gross per 24 hour  Intake 1904.87 ml  Output    563 ml  Net 1341.87 ml    HEENT:P[place]or CHEST:Crackles mid and lower lobes of the lungs bilaterally CARDS:irregular with premature beats JXB:JYNW,GNFAOZ not palpable and bowel sounds are present NEURO:Non focal SKIN:No ecchymosis HYQ:MVHQI edema  Lab Results: @labtest @  Micro Results: Recent Results (from the past 240 hour(s))  CLOSTRIDIUM DIFFICILE BY PCR     Status: Abnormal   Collection Time   07/26/11  3:10 PM      Component Value Range Status Comment   C difficile by pcr POSITIVE (*) NEGATIVE  Final   CULTURE, ROUTINE-ABSCESS     Status: Normal   Collection Time   07/28/11  1:00 PM      Component Value Range Status Comment   Specimen Description ABSCESS RIGHT THIGH   Final    Special Requests RT LATERAL THIGH PT ON ROCEPHIN FLAGYL   Final    Gram Stain     Final    Value: ABUNDANT WBC PRESENT, PREDOMINANTLY PMN     NO SQUAMOUS EPITHELIAL CELLS SEEN     NO ORGANISMS SEEN   Culture FEW KLEBSIELLA OXYTOCA   Final    Report Status 07/31/2011 FINAL   Final    Organism ID, Bacteria KLEBSIELLA OXYTOCA   Final     Studies/Results: US Renal  07/16/2011  *RADIOLOGY REPORT*  Clinical Data: Renal failure.  RENAL/URINARY TRACT ULTRASOUND COMPLETE  Comparison:  Renal ultrasound 05/04/2011.  Findings:  Right Kidney:  Measures 13.1 cm.  No stone, mass or hydronephrosis. Perihepatic ascites noted.  Left Kidney:  Measures 13.9 cm.  No stone, mass or hydronephrosis.  Bladder:  Foley catheter in place.  IMPRESSION: No acute finding.  Perihepatic ascites.  Original Report Authenticated By: Bernadene Bell. Maricela Curet, M.D.   Ct Femur Right Wo Contrast  07/26/2011  *RADIOLOGY REPORT*  Clinical Data:  Right leg swelling and pain.  Diabetic patient. Elevated white  blood cell count.  CT RIGHT THIGH WITHOUT CONTRAST  Technique:  Multidetector CT imaging of the right thigh was performed according to the standard protocol without intravenous contrast. Multiplanar CT image reconstructions were also generated.  Comparison:  CT scan right thigh 07/19/2011.  Findings:  Extensive infiltration of subcutaneous fat of the right thigh persists.  There is new fluid subjacent to the superficial fascia of the vastus lateralis and rectus femoris measuring up to 7.9 cm AP by 1.3 cm transverse by 20 cm cranial-caudal identified. Focally intense edema is seen superficial to the fascia in this same location.  No intramuscular fluid collection is identified. Fat planes within muscle are preserved.  There is no gas collection. No bony destructive change to suggest osteomyelitis is identified.  There is no fracture.  Partially visualized left thigh demonstrates infiltration of subcutaneous fat.  Again seen is diffuse infiltration of subcutaneous fat of the anterior abdominal wall.  The patient has some free pelvic fluid. Foley catheter is in place.  A rectal tube is also noted.  Imaged intra pelvic contents are otherwise unremarkable.  IMPRESSION:  1.  Infiltration of all visualized subcutaneous fatty structures is compatible with cellulitis and/or anasarca. 2.  New fluid subjacent to the superficial fascia along the lateral aspect of the thigh could be due to more intense edema or possibly abscess formation.  There is no evidence of myositis or osteomyelitis. 3.  Free pelvic fluid.  Original Report Authenticated By: Bernadene Bell. Maricela Curet, M.D.   Ct Femur Right Wo Contrast  07/19/2011  *RADIOLOGY REPORT*  Clinical Data:  Right thigh pain, swelling and erythema.  Question abscess.  Renal failure.  CT OF THE RIGHT THIGH WITHOUT CONTRAST  Technique:  Multidetector CT imaging of the right thigh was performed according to the standard protocol without intravenous contrast. Multiplanar CT image  reconstructions were also generated. Study was performed without intravenous contrast due to renal failure.  Comparison:  Right hip radiographs 09/07/2005.  Findings:  There is diffuse soft tissue stranding throughout the subcutaneous fat of  a lower abdominal panniculus. There is diffuse subcutaneous edema throughout the right thigh.  This appears greatest laterally.  The left thigh is partially imaged, and the findings on the right appear asymmetric.  No focal fluid collection is identified.  No appreciable muscular enlargement or low density is identified. There are diffuse vascular calcifications.  The right femur appears normal without evidence of acute fracture or bone destruction.  Foley catheter is in place.  There is an oval 3.4 cm low density structure anteriorly in the scrotum which could reflect a focal hydrocele or cyst.  This is suboptimally evaluated by CT.  IMPRESSION:  1.  Diffuse right thigh subcutaneous edema compatible with cellulitis.  No focal fluid collection identified. 2.  Possible associated panniculitis in the lower anterior abdominal wall.  Correlate clinically. 3.  No acute or significant osseous findings. 4.  Possible focal hydrocele or cyst within the right scrotum. Correlate clinically.  Original Report Authenticated By: Gerrianne Scale, M.D.   Ct Aspiration  07/28/2011  *RADIOLOGY REPORT*  Clinical Data: Right foot cellulitis.  Fluid subjacent to the superficial fascia in the lateral thigh.  CT GUIDANCE NEEDLE PLACEMENT  Comparison: CT 07/26/2011  Technique and findings: The procedure, risks (including but not limited to bleeding, infection, organ damage), benefits, and alternatives were explained to the patient.  Questions regarding the procedure were encouraged and answered.  The patient understands and consents to the procedure.Select axial scans through the thigh were obtained and an appropriate skin entry site was identified.  Site was marked, prepped with Betadine, draped  in usual sterile fashion, infiltrated locally with 1% lidocaine.  An 18 gauge needle was advanced into the fluid collection.  Its position was confirmed on CT.  Approximate 3 ml of thin purulent appearing material were aspirated, sent for Gram stain, culture and sensitivity.  The patient tolerated the procedure well.  No immediate complication.  IMPRESSION:  1.  Technically successful CT-guided aspiration of the right thigh deep fluid.  The fluid is incompletely aspirated, suggesting significant loculation.  Original Report Authenticated By: Osa Craver, M.D.   Dg Chest Portable 1 View  07/30/2011  *RADIOLOGY REPORT*  Clinical Data: 60 year old male with shortness of breath.  PORTABLE CHEST - 1 VIEW  Comparison: 07/27/2011 and earlier.  Findings: AP portable semi upright view at 0541 hours.  The patient is extubated.  Enteric tube is removed.  Right IJ central line and left subclavian central line remain.  Stable lung volumes. Stable cardiomegaly and mediastinal contours.  Linear and streaky perihilar opacity.  No pneumothorax or edema.  No large effusion.  IMPRESSION: 1.  Extubated.  Enteric tube removed. 2.  Stable lung volumes with perihilar atelectasis.  Original Report Authenticated By: Harley Hallmark, M.D.   Dg Chest Portable 1 View  07/27/2011  *RADIOLOGY  REPORT*  Clinical Data: Assess endotracheal tube.  PORTABLE CHEST - 1 VIEW  Comparison: 07/26/2011  Findings: Endotracheal tube is 3.2 cm above the carina. Nasogastric tube extends into the abdomen.  Right jugular central venous catheter in the SVC region.  Left subclavian central line in the lower SVC.  There are persistent basilar densities suggesting a combination of atelectasis and pleural fluid.  The heart is enlarged for size.  Evidence of a CABG procedure.  IMPRESSION: Persistent basilar densities suggesting a combination of atelectasis and possibly pleural fluid.  Cardiomegaly.  Original Report Authenticated By: Richarda Overlie, M.D.    Dg Chest Portable 1 View  07/26/2011  *RADIOLOGY REPORT*  Clinical Data: Respiratory distress.  Intubated patient.  PORTABLE CHEST - 1 VIEW  Comparison: Chest 07/25/2011 and 07/24/2011.  Findings: Support tubes and lines are unchanged.  Cardiomegaly is again seen. The patient has small bilateral pleural effusions and basilar airspace disease, greater on the left.  Left basilar aeration shows some interval worsening.  No pneumothorax is identified.  IMPRESSION: Interval worsening in left basilar airspace disease.  Otherwise unchanged.  Original Report Authenticated By: Bernadene Bell. D'ALESSIO, M.D.   Dg Chest Portable 1 View  07/25/2011  *RADIOLOGY REPORT*  Clinical Data: Respiratory distress  PORTABLE CHEST - 1 VIEW  Comparison: 07/24/2011  Findings: Cardiomegaly again noted.  Stable NG tube and endotracheal tube position. Stable right IJ central line position. Central mild vascular congestion without convincing edema.  Slight improvement in aeration.  Residual bilateral basilar atelectasis or infiltrate.  IMPRESSION:  Stable NG tube and endotracheal tube position. Stable right IJ central line position.  Central mild vascular congestion without convincing edema.  Slight improvement in aeration.  Residual bilateral basilar atelectasis or infiltrate.  Original Report Authenticated By: Natasha Mead, M.D.   Dg Chest Portable 1 View  07/24/2011  *RADIOLOGY REPORT*  Clinical Data: Pleural edema  PORTABLE CHEST - 1 VIEW  Comparison:   the previous day's study  Findings: Endotracheal tube, nasogastric tube, right IJ central line, and left arm central catheter are stable in position. Previous CABG.  Stable cardiomegaly.  Low lung volumes with patchy bibasilar atelectasis or consolidation, slightly increased since previous exam.  No definite effusion.  IMPRESSION:  1.  Low volumes with some worsening of bibasilar consolidation or atelectasis. 2. Support hardware stable in position.  Original Report Authenticated By: Osa Craver, M.D.   Dg Chest Portable 1 View  07/23/2011  *RADIOLOGY REPORT*  Clinical Data: ETT insertion  PORTABLE CHEST - 1 VIEW  Comparison: 07/23/2011  Findings: Endotracheal tube terminates 5 cm above the carina. Enteric tube courses below the diaphragm.  Stable right IJ and left subclavian venous catheters.  Cardiomegaly with mild interstitial edema.  Bilateral lower lobe opacities, likely a combination of atelectasis and pleural effusions.  Postsurgical changes related to prior CABG.  IMPRESSION: Endotracheal tube terminates 5 cm above the carina.  Cardiomegaly with mild interstitial edema.  Bilateral lower lobe opacities, likely a combination of atelectasis and effusion.  Original Report Authenticated By: Charline Bills, M.D.   Dg Chest Portable 1 View  07/23/2011  *RADIOLOGY REPORT*  Clinical Data: Pulmonary edema.  Evaluate endotracheal tube position.  PORTABLE CHEST - 1 VIEW  Comparison: 07/22/2011 and 07/21/2011.  Findings: 0556 hours. Left arm PICC and right IJ central venous catheter are unchanged in position.  There is stable cardiomegaly with pulmonary edema and bilateral pleural effusions.  No pneumothorax is demonstrated.  There is no visible endotracheal tube.  IMPRESSION:  Stable examination with persistent mild edema and bilateral pleural effusions.  Remaining support system is unchanged.  Original Report Authenticated By: Gerrianne Scale, M.D.   Dg Chest Portable 1 View  07/22/2011  *RADIOLOGY REPORT*  Clinical Data: Respiratory distress  PORTABLE CHEST - 1 VIEW  Comparison: 07/21/2011  Findings: Increase in diffuse bilateral airspace disease suggestive of pulmonary edema.  There is vascular congestion and bilateral pleural effusions.  Bibasilar atelectasis is slightly increased.  Endotracheal tube has been removed.  Right jugular catheter tip in the SVC.  Left subclavian catheter in the lower SVC.  IMPRESSION: Worsening congestive heart failure with pulmonary edema.   Slight increase in bilateral effusion and bibasilar atelectasis.  Original Report Authenticated By: Camelia Phenes, M.D.   Dg Chest Portable 1 View  07/21/2011  *RADIOLOGY REPORT*  Clinical Data: Assess ETT  PORTABLE CHEST - 1 VIEW  Comparison: 07/20/2011  Findings: Cardiomegaly with mild interstitial edema.  Right lower lobe opacity, likely a combination of atelectasis and effusion. Suspected small left pleural effusion.  No pneumothorax.  Postsurgical changes related to prior CABG.  Endotracheal tube terminates 6 cm above the carina.  Stable right IJ and left subclavian venous catheters.  Enteric tube courses below the diaphragm.  IMPRESSION: Endotracheal tube terminates 6 cm above the carina.  Cardiomegaly with mild interstitial edema and bilateral pleural effusions.  Original Report Authenticated By: Charline Bills, M.D.   Dg Chest Portable 1 View  07/20/2011  *RADIOLOGY REPORT*  Clinical Data: History of pneumonia and respiratory distress.  PORTABLE CHEST - 1 VIEW  Comparison: 07/19/2011.  Findings: Tip of endotracheal tube terminates 3.5 cm above the carina.  Tip of left sided venous catheter terminates in the lower portion of the superior vena cava.  Tip of right internal jugular venous catheter terminates in the midportion of the superior vena cava.  Enteric tube is in place with its tip in the area of the distal esophagus.  This could be advanced into the stomach.  There is continued enlargement of the cardiac silhouette unchanged. There are low lung volumes.  There is vascular congestion pattern. Atelectasis and infiltrative densities are seen within the right mid and lower lung.  There is subsegmental atelectasis in the left midlung zone with some hazy atelectasis in left base.  There is indistinctness of the right costophrenic angle most likely reflecting right pleural effusion unchanged.  There is continued opacity in the inferior left hemithorax with loss of definition of the margin of the  left hemidiaphragm.  IMPRESSION: Position of tubes and catheters are described above.  The enteric tube is in the area of the distal esophagus.  It could be advanced into the stomach if clinically indicated.  There is continued enlargement of the cardiac silhouette unchanged.  There are low lung volumes.  There is vascular congestion pattern. Atelectasis and infiltrative densities are seen within the right mid and lower lung.  There is subsegmental atelectasis in the left midlung zone with some hazy atelectasis in left base.  There is indistinctness of the right costophrenic angle most likely reflecting right pleural effusion unchanged.  There is continued opacity in the inferior left hemithorax with loss of definition of the margin of the left hemidiaphragm. No significant change from prior study.  Original Report Authenticated By: Crawford Givens, M.D.   Dg Chest Portable 1 View  07/19/2011  *RADIOLOGY REPORT*  Clinical Data: Patient on ventilator.  Line and tube placement. Shortness of breath.  PORTABLE CHEST - 1 VIEW  Comparison:  07/18/2011  Findings: Endotracheal tube has been placed, tip approximately 4 x 3 cm above carina.  Right IJ central line tip overlies the superior vena cava.  A second right IJ central line has been placed, parallel to the first, tip also in the superior vena cava. A left subclavian central line tip overlies the level of the superior vena cava.  Nasogastric tube is in place, side port in the region of the lower esophagus, tip to the level of the distal esophagus.  No evidence for pneumothorax.  The heart is enlarged.  There are bibasilar opacities, obscuring hemidiaphragms, right greater than left.  There are is a right pleural effusion, increased or more apparent.  IMPRESSION:  1.  Interval placement endotracheal tube and second right IJ central line. 2.  Interval placement of left subclavian central line. 3.  Increased right pleural effusion. 4.  Nasogastric tube to the distal  esophagus.  Original Report Authenticated By: Patterson Hammersmith, M.D.   Dg Chest Portable 1 View  07/18/2011  *RADIOLOGY REPORT*  Clinical Data: Central line placement.  PORTABLE CHEST - 1 VIEW  Comparison: Chest 07/18/2011.  Findings: The patient has a new right IJ approach catheter with the tip in the lower superior vena cava.  No pneumothorax.  Marked cardiomegaly is again seen.  Pulmonary edema shows marked improvement.  Basilar atelectasis noted.  IMPRESSION:  1.  Right IJ catheter in good position.  No pneumothorax. 2.  Marked improvement in pulmonary edema.  Original Report Authenticated By: Bernadene Bell. D'ALESSIO, M.D.   Dg Chest Portable 1 View  07/18/2011  *RADIOLOGY REPORT*  Clinical Data: Follow-up CHF and shortness of breath.  PORTABLE CHEST - 1 VIEW  Comparison: 07/15/2011  Findings: Enlargement of the cardiac silhouette.  Increased densities in both lungs suggest airspace disease and suspect pleural fluid, right side greater than left.  No evidence for a large pneumothorax.  IMPRESSION: Cardiomegaly with evidence for pulmonary edema and pleural effusions.  Findings are most compatible with CHF.  Original Report Authenticated By: Richarda Overlie, M.D.   Dg Pneumonia Chest 2v  07/15/2011  *RADIOLOGY REPORT*  Clinical Data: Shortness of breath, weakness, acute bronchitis  CHEST - 2 VIEW  Comparison: 06/24/2011  Findings: Previous right PICC line removed.  Coronary bypass changes noted.  Heart is enlarged with central vascular congestion. Band-like left midlung and bibasilar atelectasis noted, worse in the left lower lobe.  Small residual left effusion.  Upper lungs remain clear.  No pneumothorax.  Trachea is midline.  IMPRESSION: Cardiomegaly with vascular congestion, left mid lung and bibasilar atelectasis.  Residual smaller left effusion.  Original Report Authenticated By: Judie Petit. Ruel Favors, M.D.   Medications: Scheduled Meds:   . antiseptic oral rinse  15 mL Mouth Rinse QID  . cefTRIAXone  (ROCEPHIN) IV  1 g Intravenous Q24H  . chlorhexidine  15 mL Mouth/Throat Q12H  . feeding supplement  237 mL Oral BID  . insulin aspart  0-9 Units Subcutaneous TID WC  . levothyroxine  75 mcg Oral QAC breakfast  . metronidazole  500 mg Intravenous Q8H  . morphine      . morphine      . protein supplement  30 mL Oral BID  . sodium chloride      . sodium chloride      . DISCONTD: antiseptic oral rinse  15 mL Mouth Rinse Q12H  . DISCONTD: cefTRIAXone (ROCEPHIN) IV  2 g Intravenous Q24H  . DISCONTD: cefTRIAXone (ROCEPHIN) IV  2 g Intravenous Q24H  .  DISCONTD: imipenem-cilastatin  500 mg Intravenous Q8H  . DISCONTD: levothyroxine  75 mcg Per Tube QAC breakfast  . DISCONTD: sodium chloride  3 mL Intravenous Q12H   Continuous Infusions:   . sodium chloride 10 mL/hr at 08/01/11 0300  . milrinone 0.125 mcg/kg/min (08/01/11 0415)  . DISCONTD: amiodarone (CORDARONE) infusion 0.5 mg/min (08/01/11 0300)   PRN Meds:.acetaminophen, acetaminophen, calcium carbonate (dosed in mg elemental calcium), camphor-menthol, docusate sodium, feeding supplement (NEPRO CARB STEADY), haloperidol lactate, heparin, hydrOXYzine, morphine injection, ondansetron (ZOFRAN) IV, promethazine, promethazine, sorbitol, zolpidem  Assessment/Plan: Patient Active Hospital Problem List: DIABETES MELLITUS, TYPE II, UNCONTROLLED, WITH COMPLICATIONS (11/24/2006)   Assessment: Getting controlled   Plan: continue insulin sliding scale OBSTRUCTIVE SLEEP APNEA (07/01/2010)   Assessment: stable   Plan: continue 0xygen/cpap CARDIOMYOPATHY, ISCHEMIC (01/21/2009)   Assessment: stabilising   Plan: continue milrinone Chronic systolic heart failure (01/27/2009)   Assessment: compensating   Plan: continue milrinone.Repeat CXR in a.m DEEP VENOUS THROMBOPHLEBITIS, LEG, 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   Plan: will continue ted stockings Atrial fibrillation (06/24/2011)   Assessment: ventricular rate under control   Plan: continue  cardarone   Severe sepsis (07/31/2011)   Assessment: Resolving    Plan: continue rocephin and flagy Acute renal failure (07/31/2011)   Assessment: stabilising   Plan: dialysis as per nephrology   LOS: 17 days   [name] 08/01/2011, 10:26 AM

## 2011-08-01 NOTE — Progress Notes (Signed)
S:Reports to be feeling well and without any current shortness of breath. Could not tolerate hemodialysis yesterday due to hypotension and tachycardias. O:   BP 90/65  Pulse 109  Temp(Src) 98.1 F (36.7 C) (Oral)  Resp 30  Ht 5' 9.49" (1.765 m)  Wt 112.7 kg (248 lb 7.3 oz)  BMI 36.18 kg/m2  SpO2 100%  Intake/Output Summary (Last 24 hours) at 08/01/11 0735 Last data filed at 08/01/11 0700  Gross per 24 hour  Intake 1650.08 ml  Output    528 ml  Net 1122.08 ml   Weight change: 5.8 kg (12 lb 12.6 oz) WUJ:WJXBJYN comfortably propped up in bed. On oxygen via Nasal cannula   WGN:FAOZH regular tachycardia, S1 and S2 with ESM   Resp:Coarse breath sounds bilaterally, No rales, occasional wheeze  YQM:VHQI, Obese, non-tender, without organomegaly ONG:EXBMWUXLKG pedal edema, Rt>Lt leg. Right thigh with improved erythema and non tender.   Current facility-administered medications:0.9 %  sodium chloride infusion, , Intravenous, Continuous, Hilario Quarry Amend, PHARMD, Last Rate: 10 mL/hr at 08/01/11 0300;  acetaminophen (TYLENOL) suppository 650 mg, 650 mg, Rectal, Q6H PRN, Rakesh V. Vassie Loll, MD;  acetaminophen (TYLENOL) tablet 650 mg, 650 mg, Oral, Q6H PRN, Hilario Quarry Amend, PHARMD amiodarone (CORDARONE) 450 mg in dextrose 5 % 250 mL infusion, 0.5 mg/min, Intravenous, Continuous, Manson Passey, MD, Last Rate: 16.7 mL/hr at 08/01/11 0300, 0.5 mg/min at 08/01/11 0300;  antiseptic oral rinse (BIOTENE) solution 15 mL, 15 mL, Mouth Rinse, QID, Rakesh V. Vassie Loll, MD, 15 mL at 08/01/11 0400 calcium carbonate (dosed in mg elemental calcium) suspension 500 mg of elemental calcium, 500 mg of elemental calcium, Oral, Q6H PRN, Hilario Quarry Amend, PHARMD;  camphor-menthol [facility]) lotion, , Topical, PRN, Comer Locket. Vassie Loll, MD;  cefTRIAXone (ROCEPHIN) 1 g in dextrose 5 % 50 mL IVPB, 1 g, Intravenous, Q24H, Rakesh V. Vassie Loll, MD;  chlorhexidine (PERIDEX) 0.12 % solution 15 mL, 15 mL, Mouth/Throat, Q12H, Rakesh V. Vassie Loll,  MD docusate sodium [facility]) enema 283 mg, 1 enema, Rectal, PRN, Hilario Quarry Amend, PHARMD;  feeding supplement (ENSURE CLINICAL STRENGTH) liquid 237 mL, 237 mL, Oral, BID, Rakesh V. Vassie Loll, MD;  feeding supplement (NEPRO CARB STEADY) liquid 237 mL, 237 mL, Oral, TID PRN, Hilario Quarry Amend, PHARMD;  haloperidol lactate (HALDOL) injection 2-4 mg, 2-4 mg, Intravenous, Q4H PRN, Rakesh V. Vassie Loll, MD heparin injection 1,000 Units, 1,000 Units, Intravenous, PRN, Rakesh V. Vassie Loll, MD;  hydrOXYzine (ATARAX/VISTARIL) tablet 25 mg, 25 mg, Oral, Q8H PRN, Hilario Quarry Amend, PHARMD;  insulin aspart (novoLOG) injection 0-9 Units, 0-9 Units, Subcutaneous, TID WC, Rakesh V. Vassie Loll, MD;  levothyroxine (SYNTHROID, LEVOTHROID) tablet 75 mcg, 75 mcg, Oral, QAC breakfast, Rakesh V. Vassie Loll, MD metroNIDAZOLE (FLAGYL) IVPB 500 mg, 500 mg, Intravenous, Q8H, Rakesh V. Vassie Loll, MD;  milrinone [facility]) infusion 200 mcg/mL (0.2 mg/mL), 0.125 mcg/kg/min, Intravenous, Continuous, Manson Passey, MD, Last Rate: 4.1 mL/hr at 08/01/11 0415, 0.125 mcg/kg/min at 08/01/11 0415;  morphine 2 MG/ML injection 2-4 mg, 2-4 mg, Intravenous, Q1H PRN, Rakesh V. Vassie Loll, MD, 4 mg at 08/01/11 0145;  morphine 4 MG/ML injection, , , ,  ondansetron (ZOFRAN) injection 4 mg, 4 mg, Intravenous, Q6H PRN, Rakesh V. Vassie Loll, MD;  promethazine (PHENERGAN) suppository 25 mg, 25 mg, Rectal, BID PRN, Rakesh V. Vassie Loll, MD;  promethazine (PHENERGAN) tablet 12.5 mg, 12.5 mg, Oral, Q6H PRN, Hilario Quarry Amend, PHARMD;  protein supplement (PROSOURCE NO CARB) liquid 30 mL, 30 mL, Oral, BID, Rakesh V. Vassie Loll, MD;  sodium chloride 0.9 % injection, , , ,  sorbitol 70 % solution 30 mL, 30 mL, Oral, PRN, Hilario Quarry Amend, PHARMD;  zolpidem (AMBIEN) tablet 5 mg, 5 mg, Oral, QHS PRN, Hilario Quarry Amend, PHARMD;  DISCONTD: antiseptic oral rinse (BIOTENE) solution 15 mL, 15 mL, Mouth Rinse, Q12H, Rakesh V. Vassie Loll, MD;  DISCONTD: cefTRIAXone (ROCEPHIN) 2 g in dextrose 5 % 50 mL IVPB, 2 g, Intravenous, Q24H,  Rakesh V. Vassie Loll, MD DISCONTD: cefTRIAXone (ROCEPHIN) 2 g in dextrose 5 % 50 mL IVPB, 2 g, Intravenous, Q24H, Crystal Stillinger Merilynn Finland, PHARMD;  DISCONTD: imipenem-cilastatin (PRIMAXIN) 500 mg in sodium chloride 0.9 % 100 mL IVPB, 500 mg, Intravenous, Q8H, Rakesh V. Vassie Loll, MD;  DISCONTD: levothyroxine (SYNTHROID, LEVOTHROID) tablet 75 mcg, 75 mcg, Per Tube, QAC breakfast, Rakesh V. Vassie Loll, MD DISCONTD: sodium chloride 0.9 % injection 3 mL, 3 mL, Intravenous, Q12H, Rakesh V. Vassie Loll, MD No results found.  BMET    Component Value Date/Time   NA 133* 08/01/2011 0500   K 5.3* 08/01/2011 0500   CL 98 08/01/2011 0500   CO2 24 08/01/2011 0500   GLUCOSE 243* 08/01/2011 0500   BUN 72* 08/01/2011 0500   CREATININE 2.52* 08/01/2011 0500   CALCIUM 8.9 08/01/2011 0500   GFRNONAA 26* 08/01/2011 0500   GFRAA 30* 08/01/2011 0500   CBC    Component Value Date/Time   WBC 14.7* 08/01/2011 0500   RBC 3.64* 08/01/2011 0500   HGB 10.2* 08/01/2011 0500   HCT 32.1* 08/01/2011 0500   PLT 162 08/01/2011 0500   MCV 88.2 08/01/2011 0500   MCH 28.0 08/01/2011 0500   MCHC 31.8 08/01/2011 0500   RDW 19.7* 08/01/2011 0500   LYMPHSABS 0.7 07/27/2011 0308   MONOABS 1.1* 07/27/2011 0308   EOSABS 0.1 07/27/2011 0308   BASOSABS 0.0 07/27/2011 0308     Assessment/Plan:  1. AKI: on CKD. Likely hemodynamically mediated with evolution into ATN, Urine output better ( ) but still oliguric. No acute dialysis need noted today and appears that he may not be able to tolerate intermittent dialysis. If unable to tolerate regular dialysis again, family discussion may be warranted to adopt a comfort care approach.  2. Tachycardia: Probably related to infection (thigh abscess), await repeat CT scan tomorrow to evaluate progress after IV antibiotics. 3. C-diff colitis: improved and now resolved. 4. Cellulitis Right thigh: On i.v. Antibiotics, await thigh CT scan tomorrow.  Caira Poche K.

## 2011-08-01 NOTE — Progress Notes (Addendum)
Subjective:   Unable to tolerate HD last night -only lasted 35 mins due to hypotension and desaturations. Remains tachycardic. Amiodarone started yesterday for possible atrial flutter on monitor - however ECG looks like sinus tach. Also started milrinone. Remains confused. No CP or dyspnea. No fevers/chills.   Objective:  Blood pressure 90/65, pulse 109, temperature 98.1 F (36.7 C), temperature source Oral, resp. rate 30, height 5' 9.49" (1.765 m), weight 112.7 kg (248 lb 7.3 oz), SpO2 100.00%. Weight change: 5.8 kg (12 lb 12.6 oz)  Intake/Output from previous day: 11/03 0701 - 11/04 0700 In: 1650.1 [P.O.:340; I.V.:810.1] Out: 528 [Urine:528] Intake/Output from this shift:  MEDS:    . antiseptic oral rinse  15 mL Mouth Rinse QID  . cefTRIAXone (ROCEPHIN) IV  1 g Intravenous Q24H  . chlorhexidine  15 mL Mouth/Throat Q12H  . feeding supplement  237 mL Oral BID  . insulin aspart  0-9 Units Subcutaneous TID WC  . levothyroxine  75 mcg Oral QAC breakfast  . metronidazole  500 mg Intravenous Q8H  . morphine      . morphine      . protein supplement  30 mL Oral BID  . sodium chloride      . DISCONTD: antiseptic oral rinse  15 mL Mouth Rinse Q12H  . DISCONTD: cefTRIAXone (ROCEPHIN) IV  2 g Intravenous Q24H  . DISCONTD: levothyroxine  75 mcg Per Tube QAC breakfast  . DISCONTD: sodium chloride  3 mL Intravenous Q12H  . DISCONTD: sodium chloride          . sodium chloride 10 mL/hr at 08/01/11 1500  . milrinone 0.125 mcg/kg/min (08/01/11 0415)  . DISCONTD: amiodarone (CORDARONE) infusion 0.5 mg/min (08/01/11 0300)      Physical Exam: General:  Critically-ill appearing. Dyspneic HEENT: normal Neck: supple. JVP elevated . Carotids 2+ bilat; no bruits. Cor: Tachycardic. Regular. +s3 Lungs: diffuse crackles anteriorly Abdomen: soft, nontender, distended.  No bruits or masses. Good bowel sounds. Extremities: lower extremities 2+ edema. R thigh red, war, indurates Neuro: confused   cranial nerves grossly intact. moves all 4 extremities w/o difficulty. Affect pleasant  Telemetry:  Sinus tach 110   Lab Results: Results for orders placed during the hospital encounter of 07/15/11 (from the past 24 hour(s))  CARBOXYHEMOGLOBIN     Status: Abnormal   Collection Time   07/31/11  9:48 AM      Component Value Range   Total hemoglobin 11.1 (*) 13.5 - 18.0 (g/dL)   O2 Saturation 40.9     Carboxyhemoglobin 1.8 (*) 0.5 - 1.5 (%)   Methemoglobin 0.4  0.0 - 1.5 (%)  GLUCOSE, CAPILLARY     Status: Abnormal   Collection Time   07/31/11 11:10 AM      Component Value Range   Glucose-Capillary 231 (*) 70 - 99 (mg/dL)   Comment 1 Notify RN     Comment 2 Documented in Chart    GLUCOSE, CAPILLARY     Status: Abnormal   Collection Time   07/31/11  9:55 PM      Component Value Range   Glucose-Capillary 180 (*) 70 - 99 (mg/dL)  PROTIME-INR     Status: Abnormal   Collection Time   08/01/11  5:00 AM      Component Value Range   Prothrombin Time 20.8 (*) 11.6 - 15.2 (seconds)   INR 1.76 (*) 0.00 - 1.49   BASIC METABOLIC PANEL     Status: Abnormal   Collection Time   08/01/11  5:00 AM      Component Value Range   Sodium 133 (*) 135 - 145 (mEq/L)   Potassium 5.3 (*) 3.5 - 5.1 (mEq/L)   Chloride 98  96 - 112 (mEq/L)   CO2 24  19 - 32 (mEq/L)   Glucose, Bld 243 (*) 70 - 99 (mg/dL)   BUN 72 (*) 6 - 23 (mg/dL)   Creatinine, Ser 4.09 (*) 0.50 - 1.35 (mg/dL)   Calcium 8.9  8.4 - 81.1 (mg/dL)   GFR calc non Af Amer 26 (*) >90 (mL/min)   GFR calc Af Amer 30 (*) >90 (mL/min)  CBC     Status: Abnormal   Collection Time   08/01/11  5:00 AM      Component Value Range   WBC 14.7 (*) 4.0 - 10.5 (K/uL)   RBC 3.64 (*) 4.22 - 5.81 (MIL/uL)   Hemoglobin 10.2 (*) 13.0 - 17.0 (g/dL)   HCT 91.4 (*) 78.2 - 52.0 (%)   MCV 88.2  78.0 - 100.0 (fL)   MCH 28.0  26.0 - 34.0 (pg)   MCHC 31.8  30.0 - 36.0 (g/dL)   RDW 95.6 (*) 21.3 - 15.5 (%)   Platelets 162  150 - 400 (K/uL)  APTT     Status: Normal     Collection Time   08/01/11  5:00 AM      Component Value Range   aPTT 37  24 - 37 (seconds)     Imaging: No results found.  ECG: sinus tach 120.    ASSESSMENT:  1) A/C systolic HF - EF 08-65%       --milrinone started on 11/3 2) A/C respiratory failure 3) CAD s/p recent CABG 4) A/C renal failure - now on dialysis 5) R thigh abscess with related sepsis       --klebsiella oxytoca 6) Tachycardia 7) Severe debilitation  PLAN/DISCUSSION:  Remains very tenuous. Will continue milrinone for now. Stop amiodarone as tachycardia is sinus and not a. flutter as appears on monitor. Need to revisit I &D of R thigh abscess as I think this is driving his tachycardia and sepsis. Prognosis appears very poor.     LOS: 17 days    Arvilla Meres 08/01/2011, 7:52 AM

## 2011-08-02 ENCOUNTER — Inpatient Hospital Stay (HOSPITAL_COMMUNITY): Payer: Medicare Other

## 2011-08-02 ENCOUNTER — Other Ambulatory Visit (HOSPITAL_COMMUNITY): Payer: Self-pay | Admitting: *Deleted

## 2011-08-02 ENCOUNTER — Encounter (HOSPITAL_COMMUNITY): Payer: Self-pay | Admitting: Internal Medicine

## 2011-08-02 DIAGNOSIS — I5021 Acute systolic (congestive) heart failure: Secondary | ICD-10-CM

## 2011-08-02 DIAGNOSIS — R627 Adult failure to thrive: Secondary | ICD-10-CM

## 2011-08-02 LAB — CBC
Platelets: 193 10*3/uL (ref 150–400)
RBC: 3.55 MIL/uL — ABNORMAL LOW (ref 4.22–5.81)
RDW: 20.1 % — ABNORMAL HIGH (ref 11.5–15.5)
WBC: 9.9 10*3/uL (ref 4.0–10.5)

## 2011-08-02 LAB — DIFFERENTIAL
Basophils Absolute: 0 10*3/uL (ref 0.0–0.1)
Basophils Relative: 0 % (ref 0–1)
Lymphocytes Relative: 9 % — ABNORMAL LOW (ref 12–46)
Monocytes Absolute: 0.7 10*3/uL (ref 0.1–1.0)
Monocytes Relative: 7 % (ref 3–12)
Neutro Abs: 8.1 10*3/uL — ABNORMAL HIGH (ref 1.7–7.7)
Neutrophils Relative %: 82 % — ABNORMAL HIGH (ref 43–77)

## 2011-08-02 LAB — CARBOXYHEMOGLOBIN
Carboxyhemoglobin: 1.6 % — ABNORMAL HIGH (ref 0.5–1.5)
Methemoglobin: 1 % (ref 0.0–1.5)
Total hemoglobin: 10.6 g/dL — ABNORMAL LOW (ref 13.5–18.0)

## 2011-08-02 LAB — COMPREHENSIVE METABOLIC PANEL
AST: 13 U/L (ref 0–37)
Albumin: 2.4 g/dL — ABNORMAL LOW (ref 3.5–5.2)
BUN: 88 mg/dL — ABNORMAL HIGH (ref 6–23)
Calcium: 9.1 mg/dL (ref 8.4–10.5)
Chloride: 96 mEq/L (ref 96–112)
Creatinine, Ser: 2.87 mg/dL — ABNORMAL HIGH (ref 0.50–1.35)
GFR calc non Af Amer: 22 mL/min — ABNORMAL LOW (ref >90)
Total Bilirubin: 0.5 mg/dL (ref 0.3–1.2)

## 2011-08-02 LAB — GLUCOSE, CAPILLARY
Glucose-Capillary: 183 mg/dL — ABNORMAL HIGH (ref 70–99)
Glucose-Capillary: 135 mg/dL — ABNORMAL HIGH (ref 70–99)

## 2011-08-02 LAB — MAGNESIUM: Magnesium: 2.5 mg/dL (ref 1.5–2.5)

## 2011-08-02 LAB — PROTIME-INR
INR: 1.64 — ABNORMAL HIGH (ref 0.00–1.49)
Prothrombin Time: 19.7 seconds — ABNORMAL HIGH (ref 11.6–15.2)

## 2011-08-02 LAB — APTT: aPTT: 44 seconds — ABNORMAL HIGH (ref 24–37)

## 2011-08-02 MED ORDER — MORPHINE SULFATE 2 MG/ML IJ SOLN
2.0000 mg | INTRAMUSCULAR | Status: DC | PRN
  Administered 2011-08-03: 4 mg via INTRAVENOUS

## 2011-08-02 MED ORDER — MORPHINE SULFATE 4 MG/ML IJ SOLN
2.0000 mg | INTRAMUSCULAR | Status: DC | PRN
  Administered 2011-08-03 – 2011-08-04 (×2): 2 mg via INTRAVENOUS
  Administered 2011-08-04: 4 mg via INTRAVENOUS
  Filled 2011-08-02 (×3): qty 1

## 2011-08-02 NOTE — Progress Notes (Signed)
Godson Pollan, OTR/L 08/02/2011, 8:15 AM

## 2011-08-02 NOTE — Progress Notes (Signed)
Physical Therapy Patient Details Name: [name] MRN: [id number] DOB: [birth year] Today's Date: 08/02/2011  Followed up on pt as he was added to PT/OT pt list when inputted into [facility].  PT/OT had previously signed off on pt as pt medically not appropriate for PT/OT.  Will also sign off in [facility], as we had previously in the paper chart.  Please write for new order if pt becomes appropriate for activity.    MCGROGAN,Missy Baksh 08/02/2011, 8:10 AM

## 2011-08-02 NOTE — Consult Note (Signed)
Consult is for review of medical treatment options, clarification of goals of care and end of life issues, disposition and options, and symptom recommendation.  This NP Lorinda Creed reviewed medical records, received report from team, assessed the patient and then meet at the patient's bedside along with his sister Nadene Rubins to discuss diagnosis prognosis, GOC, EOL wishes disposition and options.  Active Problems:  DIABETES MELLITUS, TYPE II, UNCONTROLLED, WITH COMPLICATIONS  OBSTRUCTIVE SLEEP APNEA  CARDIOMYOPATHY, ISCHEMIC  Chronic systolic heart failure  DEEP VENOUS THROMBOPHLEBITIS, LEG, RIGHT  Atrial fibrillation  Severe sepsis  Acute renal failure Adult Failure to Thrive Palliative Performance Scale 30% Chronic Obstructive Pulmonary Disease  Patient Goals/ Recommendation 1)  Patient wishes to continue with present medical interventions.  He is hopeful for improvement.  He agrees to re meet with this NP to continue discussion in light of his overall poor prognosis. 2) Open to chaplain support.  This NP notified Chaplain services.   Social: Patient has minimal support system.  His sister is his only participating family member and she lives in [place] # [phone].  His mother died in 10/22/10.  He does have a friend "DON" who lives in [place], but he has his own health issues.  A detailed discussion was had today regarding advanced directives.  Concepts specific to code status, artifical feeding and hydration, continued IV antibiotics and rehospitalization was had.  The difference between a aggressive medical intervention path  and a palliative comfort care path for this patient at this time was had.  Values and goals of care important to patient and family were attempted to be elicited.  This was a difficult conversation being the patient has very [place] insight  into his understanding of his complex medical situation.   This NP scheduled a return follow-up visit  with this patient and his sister for Wednesday at 1300.  Total time spent on the unit was 90 minutes.  Time in 1300- time out 1430. Greater than 50% of the time was spent in counseling and coordination of care.

## 2011-08-02 NOTE — Progress Notes (Signed)
Chaplain Note:  Chaplain visited with pt and pt's sister.  Pt is coming to grips with the fact that the end of his life is rapidly approaching.  He desires to visit [place] before he dies, even if he dies in the journey.  Pt expresses strong faith in God and, though sad, does not express particular fear of death.  There may be fear involved here but, if so, pt is reluctant to discuss it.   Pt expressed desire for an audio version of any book by [place].  Chaplain will check to see if any such resources are available in our system. At pt's request, chaplain had prayer with pt and pt's sister. Chaplain support was appreciated.  Will follow up regarding audio book and any other matters as required.  Verdie Shire, chaplain resident [phone])

## 2011-08-02 NOTE — Progress Notes (Signed)
SUBJECTIVE:  Confused but less so than previous.  SOB but improved.  Leg pain less intense   Filed Vitals:   08/02/11 1500 08/02/11 1600 08/02/11 1700 08/02/11 1800  BP: 105/57 103/60 93/58 87/54   Pulse: 95 98 100 98  Temp:  98.8 F (37.1 C)    TempSrc:  Oral    Resp: 24 21 24 29   Height:      Weight:      SpO2: 99% 99% 96% 92%    Intake/Output Summary (Last 24 hours) at 08/02/11 1826 Last data filed at 08/02/11 1800  Gross per 24 hour  Intake 1257.4 ml  Output   4760 ml  Net -3502.6 ml    LABS: Lab Results  Component Value Date   WBC 9.9 08/02/2011   HGB 10.0* 08/02/2011   HCT 31.8* 08/02/2011   PLT 193 08/02/2011   GLUCOSE 216* 08/02/2011   CHOL 91 05/02/2011   TRIG 81 05/02/2011   HDL 24* 05/02/2011   LDLCALC 51 05/02/2011   ALT 11 08/02/2011   AST 13 08/02/2011   NA 134* 08/02/2011   K 5.1 08/02/2011   CL 96 08/02/2011   CREATININE 2.87* 08/02/2011   BUN 88* 08/02/2011   CO2 25 08/02/2011   TSH 13.607* 06/24/2011   PSA 0.50 04/24/2010   INR 1.64* 08/02/2011   HGBA1C 6.7* 05/02/2011   Lab Results  Component Value Date   INR 1.64* 08/02/2011   INR 1.76* 08/01/2011   INR 2.42* 07/30/2011   Lab Results  Component Value Date   CKTOTAL 44 07/18/2011   CKMB 4.9* 06/25/2011   TROPONINI <0.30 07/15/2011    PHYSICAL EXAM General:  Chronically ill but in no acute distress Lungs:   Decreased breath sounds with bibasilar crackles Heart:  Tachy, distant heart sounds Abdomen:  No rebound, no guarding Extremities:  Decreased edema   TELEMETRY: Reviewed telemetry pt in sinus with PACs  ASSESSMENT AND PLAN:  Active Problems:  DIABETES MELLITUS, TYPE II, UNCONTROLLED, WITH COMPLICATIONS  OBSTRUCTIVE SLEEP APNEA  CARDIOMYOPATHY, ISCHEMIC  Chronic systolic heart failure  DEEP VENOUS THROMBOPHLEBITIS, LEG, RIGHT  Atrial fibrillation  Severe sepsis  Acute renal failure  Adult failure to thrive   The patient will continue on Milrinone for now.  Volume management per dialysis as BP  allows.  I agree that the prognosis is very poor and palliative care would be appropriate.   Unable to offer much more from a cardiac standpoint.  [name] 08/02/2011 6:26 PM

## 2011-08-02 NOTE — Progress Notes (Signed)
S:Reports to be feeling fair and optimistic about successful dialysis today. Denies any chest pain or shortness of breath.  O:BP 106/70  Pulse 109  Temp(Src) 97.4 F (36.3 C) (Oral)  Resp 23  Ht 5' 9.49" (1.765 m)  Wt 111.5 kg (245 lb 13 oz)  BMI 35.79 kg/m2  SpO2 100%  Intake/Output Summary (Last 24 hours) at 08/02/11 0813 Last data filed at 08/02/11 0600  Gross per 24 hour  Intake 2024.99 ml  Output    550 ml  Net 1474.99 ml   Weight change: -4.4 kg (-9 lb 11.2 oz) ZOX:[id number] resting in bed XBJ:YNWGN regular tachycardia, S1 and S2 with ESM Resp:Coarse BS bilaterally, No rales/Rhonchi FAO:ZHYQ, obese, NT, BS+ MVH:QIONGEXBM pedal edema Rt>Lt. Right  Thigh without erythema or obvious fluctuance      . antiseptic oral rinse  15 mL Mouth Rinse QID  . cefTRIAXone (ROCEPHIN) IV  1 g Intravenous Q24H  . chlorhexidine  15 mL Mouth/Throat Q12H  . feeding supplement  237 mL Oral BID  . insulin aspart  0-9 Units Subcutaneous TID WC  . levothyroxine  75 mcg Oral QAC breakfast  . metronidazole  500 mg Intravenous Q8H  . morphine      . morphine      . protein supplement  30 mL Oral BID  . sodium chloride      . DISCONTD: sodium chloride       Dg Chest Portable 1 View  08/02/2011  *RADIOLOGY REPORT*  Clinical Data: Congestive heart failure and shortness of breath  PORTABLE CHEST - 1 VIEW  Comparison: 07/30/2011  Findings:  There is a left arm PICC line with tip in the SVC.  Right IJ catheter tip is in the SVC.  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  Heart size is enlarged.  Interval increase in pleural effusions with decreased aeration to both lung bases.  IMPRESSION:  1.  Increase in bilateral pleural effusions and associated decreased aeration the lung bases  Original Report Authenticated By: Rosealee Albee, M.D.   BMET    Component Value Date/Time   NA 134* 08/02/2011 0454   K 5.1 08/02/2011 0454   CL 96 08/02/2011 0454   CO2 25 08/02/2011 0454   GLUCOSE 216* 08/02/2011 0454   BUN 88* 08/02/2011 0454   CREATININE 2.87* 08/02/2011 0454   CALCIUM 9.1 08/02/2011 0454   GFRNONAA 22* 08/02/2011 0454   GFRAA 25* 08/02/2011 0454   CBC    Component Value Date/Time   WBC 9.9 08/02/2011 0454   RBC 3.55* 08/02/2011 0454   HGB 10.0* 08/02/2011 0454   HCT 31.8* 08/02/2011 0454   PLT 193 08/02/2011 0454   MCV 89.6 08/02/2011 0454   MCH 28.2 08/02/2011 0454   MCHC 31.4 08/02/2011 0454   RDW 20.1* 08/02/2011 0454   LYMPHSABS 0.9 08/02/2011 0454   MONOABS 0.7 08/02/2011 0454   EOSABS 0.1 08/02/2011 0454   BASOSABS 0.0 08/02/2011 0454     Assessment/Plan:  Assessment/Plan:  1. AKI: on CKD. Likely hemodynamically mediated with evolution into ATN, Urine output better (528mL)and now barely non-oliguric. Will order dialysis today for clearance and correction of hyponatremia/mild hyperkalemia. If unable to tolerate regular dialysis again, family discussion may be warranted to adopt a comfort care approach. As per prior discussions held between CCM and Mr. Minniefield sister (who has been his primary decision maker) 2. Tachycardia: Probably related to infection (thigh abscess), await repeat CT scan today to evaluate  progress after IV antibiotics.  3. C-diff colitis: improved and now resolved.  4. Cellulitis Right thigh: On i.v. Antibiotics, await thigh CT scan today.    Mersedes Alber K.

## 2011-08-02 NOTE — Progress Notes (Signed)
Subjective: Patient seen and feels better.Denies any specific complaints  Objective: Weight change: -4.4 kg (-9 lb 11.2 oz)  Intake/Output Summary (Last 24 hours) at 08/02/11 1154 Last data filed at 08/02/11 0900  Gross per 24 hour  Intake   1762 ml  Output    560 ml  Net   1202 ml   BP 100/70  Pulse 96  Temp(Src) 97.1 F (36.2 C) (Axillary)  Resp 24  Ht 5' 9.49" (1.765 m)  Wt 111.5 kg (245 lb 13 oz)  BMI 35.79 kg/m2  SpO2 98% Physical Exam: General appearance: alert, cooperative and no distress Head: Normocephalic, without obvious abnormality, atraumatic Neck: no adenopathy, no carotid bruit, no JVD, supple, symmetrical, trachea midline and thyroid not enlarged, symmetric, no tenderness/mass/nodules Lungs: clear to auscultation bilaterally Heart: regular rate and rhythm, S1, S2 normal, no murmur, click, rub or gallop Abdomen: soft, non-tender; bowel sounds normal; no masses,  no organomegaly Extremities: Trace edema Skin: Skin color, texture, turgor normal. No rashes or lesions  Lab Results: @labtest @  Micro Results: Recent Results (from the past 240 hour(s))  CLOSTRIDIUM DIFFICILE BY PCR     Status: Abnormal   Collection Time   07/26/11  3:10 PM      Component Value Range Status Comment   C difficile by pcr POSITIVE (*) NEGATIVE  Final   CULTURE, ROUTINE-ABSCESS     Status: Normal   Collection Time   07/28/11  1:00 PM      Component Value Range Status Comment   Specimen Description ABSCESS RIGHT THIGH   Final    Special Requests RT LATERAL THIGH PT ON ROCEPHIN FLAGYL   Final    Gram Stain     Final    Value: ABUNDANT WBC PRESENT, PREDOMINANTLY PMN     NO SQUAMOUS EPITHELIAL CELLS SEEN     NO ORGANISMS SEEN   Culture FEW KLEBSIELLA OXYTOCA   Final    Report Status 07/31/2011 FINAL   Final    Organism ID, Bacteria KLEBSIELLA OXYTOCA   Final     Studies/Results: US Renal  07/16/2011  *RADIOLOGY REPORT*  Clinical Data: Renal failure.  RENAL/URINARY TRACT  ULTRASOUND COMPLETE  Comparison:  Renal ultrasound 05/04/2011.  Findings:  Right Kidney:  Measures 13.1 cm.  No stone, mass or hydronephrosis. Perihepatic ascites noted.  Left Kidney:  Measures 13.9 cm.  No stone, mass or hydronephrosis.  Bladder:  Foley catheter in place.  IMPRESSION: No acute finding.  Perihepatic ascites.  Original Report Authenticated By: Bernadene Bell. Maricela Curet, M.D.   Ct Femur Right Wo Contrast  07/26/2011  *RADIOLOGY REPORT*  Clinical Data:  Right leg swelling and pain.  Diabetic patient. Elevated white blood cell count.  CT RIGHT THIGH WITHOUT CONTRAST  Technique:  Multidetector CT imaging of the right thigh was performed according to the standard protocol without intravenous contrast. Multiplanar CT image reconstructions were also generated.  Comparison:  CT scan right thigh 07/19/2011.  Findings:  Extensive infiltration of subcutaneous fat of the right thigh persists.  There is new fluid subjacent to the superficial fascia of the vastus lateralis and rectus femoris measuring up to 7.9 cm AP by 1.3 cm transverse by 20 cm cranial-caudal identified. Focally intense edema is seen superficial to the fascia in this same location.  No intramuscular fluid collection is identified. Fat planes within muscle are preserved.  There is no gas collection. No bony destructive change to suggest osteomyelitis is identified.  There is no fracture.  Partially visualized left thigh demonstrates  infiltration of subcutaneous fat.  Again seen is diffuse infiltration of subcutaneous fat of the anterior abdominal wall.  The patient has some free pelvic fluid. Foley catheter is in place.  A rectal tube is also noted.  Imaged intra pelvic contents are otherwise unremarkable.  IMPRESSION:  1.  Infiltration of all visualized subcutaneous fatty structures is compatible with cellulitis and/or anasarca. 2.  New fluid subjacent to the superficial fascia along the lateral aspect of the thigh could be due to more intense  edema or possibly abscess formation.  There is no evidence of myositis or osteomyelitis. 3.  Free pelvic fluid.  Original Report Authenticated By: Bernadene Bell. Maricela Curet, M.D.   Ct Femur Right Wo Contrast  07/19/2011  *RADIOLOGY REPORT*  Clinical Data:  Right thigh pain, swelling and erythema.  Question abscess.  Renal failure.  CT OF THE RIGHT THIGH WITHOUT CONTRAST  Technique:  Multidetector CT imaging of the right thigh was performed according to the standard protocol without intravenous contrast. Multiplanar CT image reconstructions were also generated. Study was performed without intravenous contrast due to renal failure.  Comparison:  Right hip radiographs 09/07/2005.  Findings:  There is diffuse soft tissue stranding throughout the subcutaneous fat of  a lower abdominal panniculus. There is diffuse subcutaneous edema throughout the right thigh.  This appears greatest laterally.  The left thigh is partially imaged, and the findings on the right appear asymmetric.  No focal fluid collection is identified.  No appreciable muscular enlargement or low density is identified. There are diffuse vascular calcifications.  The right femur appears normal without evidence of acute fracture or bone destruction.  Foley catheter is in place.  There is an oval 3.4 cm low density structure anteriorly in the scrotum which could reflect a focal hydrocele or cyst.  This is suboptimally evaluated by CT.  IMPRESSION:  1.  Diffuse right thigh subcutaneous edema compatible with cellulitis.  No focal fluid collection identified. 2.  Possible associated panniculitis in the lower anterior abdominal wall.  Correlate clinically. 3.  No acute or significant osseous findings. 4.  Possible focal hydrocele or cyst within the right scrotum. Correlate clinically.  Original Report Authenticated By: Gerrianne Scale, M.D.   Ct Aspiration  07/28/2011  *RADIOLOGY REPORT*  Clinical Data: Right foot cellulitis.  Fluid subjacent to the  superficial fascia in the lateral thigh.  CT GUIDANCE NEEDLE PLACEMENT  Comparison: CT 07/26/2011  Technique and findings: The procedure, risks (including but not limited to bleeding, infection, organ damage), benefits, and alternatives were explained to the patient.  Questions regarding the procedure were encouraged and answered.  The patient understands and consents to the procedure.Select axial scans through the thigh were obtained and an appropriate skin entry site was identified.  Site was marked, prepped with Betadine, draped in usual sterile fashion, infiltrated locally with 1% lidocaine.  An 18 gauge needle was advanced into the fluid collection.  Its position was confirmed on CT.  Approximate 3 ml of thin purulent appearing material were aspirated, sent for Gram stain, culture and sensitivity.  The patient tolerated the procedure well.  No immediate complication.  IMPRESSION:  1.  Technically successful CT-guided aspiration of the right thigh deep fluid.  The fluid is incompletely aspirated, suggesting significant loculation.  Original Report Authenticated By: Osa Craver, M.D.   Dg Chest Portable 1 View  08/02/2011  *RADIOLOGY REPORT*  Clinical Data: Congestive heart failure and shortness of breath  PORTABLE CHEST - 1 VIEW  Comparison: 07/30/2011  Findings:  There is a left arm PICC line with tip in the SVC.  Right IJ catheter tip is in the SVC.  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  Heart size is enlarged.  Interval increase in pleural effusions with decreased aeration to both lung bases.  IMPRESSION:  1.  Increase in bilateral pleural effusions and associated decreased aeration the lung bases  Original Report Authenticated By: Rosealee Albee, M.D.   Dg Chest Portable 1 View  07/30/2011  *RADIOLOGY REPORT*  Clinical Data: 60 year old male with shortness of breath.  PORTABLE CHEST - 1 VIEW  Comparison: 07/27/2011 and earlier.  Findings: AP  portable semi upright view at 0541 hours.  The patient is extubated.  Enteric tube is removed.  Right IJ central line and left subclavian central line remain.  Stable lung volumes. Stable cardiomegaly and mediastinal contours.  Linear and streaky perihilar opacity.  No pneumothorax or edema.  No large effusion.  IMPRESSION: 1.  Extubated.  Enteric tube removed. 2.  Stable lung volumes with perihilar atelectasis.  Original Report Authenticated By: Harley Hallmark, M.D.   Dg Chest Portable 1 View  07/27/2011  *RADIOLOGY REPORT*  Clinical Data: Assess endotracheal tube.  PORTABLE CHEST   Medications: Scheduled Meds:   . antiseptic oral rinse  15 mL Mouth Rinse QID  . cefTRIAXone (ROCEPHIN) IV  1 g Intravenous Q24H  . chlorhexidine  15 mL Mouth/Throat Q12H  . feeding supplement  237 mL Oral BID  . insulin aspart  0-9 Units Subcutaneous TID WC  . levothyroxine  75 mcg Oral QAC breakfast  . metronidazole  500 mg Intravenous Q8H  . morphine      . protein supplement  30 mL Oral BID  . sodium chloride       Continuous Infusions:   . sodium chloride 10 mL/hr at 08/02/11 0900  . sodium chloride 10 mL/hr at 08/02/11 0900  . milrinone 0.124 mcg/kg/min (08/02/11 1000)   PRN Meds:.sodium chloride, sodium chloride, acetaminophen, acetaminophen, calcium carbonate (dosed in mg elemental calcium), camphor-menthol, docusate sodium, feeding supplement (NEPRO CARB STEADY), haloperidol lactate, heparin, hydrOXYzine, lidocaine, lidocaine-prilocaine, morphine injection, ondansetron (ZOFRAN) IV, pentafluoroprop-tetrafluoroeth, promethazine, promethazine, sorbitol, zolpidem  Assessment/Plan: DIABETES MELLITUS,  OBSTRUCTIVE SLEEP APNEA CARDIOMYOPATHY, ISCHEMIC Chronic systolic heart failure DEEP VENOUS THROMBOPHLEBITIS, Atrial fibrillation -Rate under control Severe sepsis-Resolve Acute renal failure/CRF-Dialysis as per nephrology   LOS: 18 days   [name] 08/02/2011, 11:54 AM

## 2011-08-03 LAB — RENAL FUNCTION PANEL
CO2: 28 mEq/L (ref 19–32)
Calcium: 8.6 mg/dL (ref 8.4–10.5)
Creatinine, Ser: 2.31 mg/dL — ABNORMAL HIGH (ref 0.50–1.35)
Glucose, Bld: 161 mg/dL — ABNORMAL HIGH (ref 70–99)
Phosphorus: 3.8 mg/dL (ref 2.3–4.6)

## 2011-08-03 LAB — CBC
HCT: 30 % — ABNORMAL LOW (ref 39.0–52.0)
Hemoglobin: 9.5 g/dL — ABNORMAL LOW (ref 13.0–17.0)
MCH: 28.2 pg (ref 26.0–34.0)
MCHC: 31.7 g/dL (ref 30.0–36.0)
MCV: 89 fL (ref 78.0–100.0)
RDW: 20.1 % — ABNORMAL HIGH (ref 11.5–15.5)

## 2011-08-03 LAB — GLUCOSE, CAPILLARY
Glucose-Capillary: 155 mg/dL — ABNORMAL HIGH (ref 70–99)
Glucose-Capillary: 142 mg/dL — ABNORMAL HIGH (ref 70–99)
Glucose-Capillary: 223 mg/dL — ABNORMAL HIGH (ref 70–99)
Glucose-Capillary: 121 mg/dL — ABNORMAL HIGH (ref 70–99)

## 2011-08-03 NOTE — Progress Notes (Signed)
Subjective: Patient claims he feels better today than previous days  Objective: Weight change: 0 kg (0 lb)  Intake/Output Summary (Last 24 hours) at 08/03/11 1429 Last data filed at 08/03/11 1000  Gross per 24 hour  Intake 1217.86 ml  Output    320 ml  Net 897.86 ml   BP 108/76  Pulse 94  Temp(Src) 98.4 F (36.9 C) (Oral)  Resp 19  Ht 5' 9.49" (1.765 m)  Wt 110.3 kg (243 lb 2.7 oz)  BMI 35.41 kg/m2  SpO2 93% Physical Exam: General appearance: alert, cooperative and no distress Head: Normocephalic, without obvious abnormality, atraumatic Neck: no adenopathy, no carotid bruit, no JVD, supple, symmetrical, trachea midline and thyroid not enlarged, symmetric, no tenderness/mass/nodules Lungs: clear to auscultation bilaterally Heart: regular rate and rhythm, S1, S2 normal, no murmur, click, rub or gallop Abdomen: soft, non-tender; bowel sounds normal; no masses,  no organomegaly Extremities: Trace edemal Skin: Skin color, texture, turgor normal. No rashes or lesions  Lab Results: @labtest @  Micro Results: Recent Results (from the past 240 hour(s))  CLOSTRIDIUM DIFFICILE BY PCR     Status: Abnormal   Collection Time   07/26/11  3:10 PM      Component Value Range Status Comment   C difficile by pcr POSITIVE (*) NEGATIVE  Final   CULTURE, ROUTINE-ABSCESS     Status: Normal   Collection Time   07/28/11  1:00 PM      Component Value Range Status Comment   Specimen Description ABSCESS RIGHT THIGH   Final    Special Requests RT LATERAL THIGH PT ON ROCEPHIN FLAGYL   Final    Gram Stain     Final    Value: ABUNDANT WBC PRESENT, PREDOMINANTLY PMN     NO SQUAMOUS EPITHELIAL CELLS SEEN     NO ORGANISMS SEEN   Culture FEW KLEBSIELLA OXYTOCA   Final    Report Status 07/31/2011 FINAL   Final    Organism ID, Bacteria KLEBSIELLA OXYTOCA   Final     Studies/Results: US Renal  07/16/2011  *RADIOLOGY REPORT*  Clinical Data: Renal failure.  RENAL/URINARY TRACT ULTRASOUND COMPLETE   Comparison:  Renal ultrasound 05/04/2011.  Findings:  Right Kidney:  Measures 13.1 cm.  No stone, mass or hydronephrosis. Perihepatic ascites noted.  Left Kidney:  Measures 13.9 cm.  No stone, mass or hydronephrosis.  Bladder:  Foley catheter in place.  IMPRESSION: No acute finding.  Perihepatic ascites.  Original Report Authenticated By: Bernadene Bell. Maricela Curet, M.D.   Ct Femur Right Wo Contrast  07/26/2011  *RADIOLOGY REPORT*  Clinical Data:  Right leg swelling and pain.  Diabetic patient. Elevated white blood cell count.  CT RIGHT THIGH WITHOUT CONTRAST  Technique:  Multidetector CT imaging of the right thigh was performed according to the standard protocol without intravenous contrast. Multiplanar CT image reconstructions were also generated.  Comparison:  CT scan right thigh 07/19/2011.  Findings:  Extensive infiltration of subcutaneous fat of the right thigh persists.  There is new fluid subjacent to the superficial fascia of the vastus lateralis and rectus femoris measuring up to 7.9 cm AP by 1.3 cm transverse by 20 cm cranial-caudal identified. Focally intense edema is seen superficial to the fascia in this same location.  No intramuscular fluid collection is identified. Fat planes within muscle are preserved.  There is no gas collection. No bony destructive change to suggest osteomyelitis is identified.  There is no fracture.  Partially visualized left thigh demonstrates infiltration of subcutaneous fat.  Again seen is diffuse infiltration of subcutaneous fat of the anterior abdominal wall.  The patient has some free pelvic fluid. Foley catheter is in place.  A rectal tube is also noted.  Imaged intra pelvic contents are otherwise unremarkable.  IMPRESSION:  1.  Infiltration of all visualized subcutaneous fatty structures is compatible with cellulitis and/or anasarca. 2.  New fluid subjacent to the superficial fascia along the lateral aspect of the thigh could be due to more intense edema or possibly  abscess formation.  There is no evidence of myositis or osteomyelitis. 3.  Free pelvic fluid.  Original Report Authenticated By: Bernadene Bell. Maricela Curet, M.D.   Ct Femur Right Wo Contrast  07/19/2011  *RADIOLOGY REPORT*  Clinical Data:  Right thigh pain, swelling and erythema.  Question abscess.  Renal failure.  CT OF THE RIGHT THIGH WITHOUT CONTRAST  Technique:  Multidetector CT imaging of the right thigh was performed according to the standard protocol without intravenous contrast. Multiplanar CT image reconstructions were also generated. Study was performed without intravenous contrast due to renal failure.  Comparison:  Right hip radiographs 09/07/2005.  Findings:  There is diffuse soft tissue stranding throughout the subcutaneous fat of  a lower abdominal panniculus. There is diffuse subcutaneous edema throughout the right thigh.  This appears greatest laterally.  The left thigh is partially imaged, and the findings on the right appear asymmetric.  No focal fluid collection is identified.  No appreciable muscular enlargement or low density is identified. There are diffuse vascular calcifications.  The right femur appears normal without evidence of acute fracture or bone destruction.  Foley catheter is in place.  There is an oval 3.4 cm low density structure anteriorly in the scrotum which could reflect a focal hydrocele or cyst.  This is suboptimally evaluated by CT.  IMPRESSION:  1.  Diffuse right thigh subcutaneous edema compatible with cellulitis.  No focal fluid collection identified. 2.  Possible associated panniculitis in the lower anterior abdominal wall.  Correlate clinically. 3.  No acute or significant osseous findings. 4.  Possible focal hydrocele or cyst within the right scrotum. Correlate clinically.  Original Report Authenticated By: Gerrianne Scale, M.D.   Ct Aspiration  07/28/2011  *RADIOLOGY REPORT*  Clinical Data: Right foot cellulitis.  Fluid subjacent to the superficial fascia in the  lateral thigh.  CT GUIDANCE NEEDLE PLACEMENT  Comparison: CT 07/26/2011  Technique and findings: The procedure, risks (including but not limited to bleeding, infection, organ damage), benefits, and alternatives were explained to the patient.  Questions regarding the procedure were encouraged and answered.  The patient understands and consents to the procedure.Select axial scans through the thigh were obtained and an appropriate skin entry site was identified.  Site was marked, prepped with Betadine, draped in usual sterile fashion, infiltrated locally with 1% lidocaine.  An 18 gauge needle was advanced into the fluid collection.  Its position was confirmed on CT.  Approximate 3 ml of thin purulent appearing material were aspirated, sent for Gram stain, culture and sensitivity.  The patient tolerated the procedure well.  No immediate complication.  IMPRESSION:  1.  Technically successful CT-guided aspiration of the right thigh deep fluid.  The fluid is incompletely aspirated, suggesting significant loculation.  Original Report Authenticated By: Osa Craver, M.D.   Dg Chest Portable 1 View  08/02/2011  *RADIOLOGY REPORT*  Clinical Data: Congestive heart failure and shortness of breath  PORTABLE CHEST - 1 VIEW  Comparison: 07/30/2011  Findings:  There is a  left arm PICC line with tip in the SVC.  Right IJ catheter tip is in the SVC.  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  Heart size is enlarged.  Interval increase in pleural effusions with decreased aeration to both lung bases.  IMPRESSION:  1.  Increase in bilateral pleural effusions and associated decreased aeration the lung bases  Original Report Authenticated By: Rosealee Albee, M.D.   Dg Chest Portable 1 View  07/30/2011  *RADIOLOGY REPORT*  Clinical Data: 60 year old male with shortness of breath.  PORTABLE CHEST - 1 VIEW  Comparison: 07/27/2011 and earlier.  Findings: AP portable semi upright  view at 0541 hours.  The patient is extubated.  Enteric tube is removed.  Right IJ central line and left subclavian central line remain.  Stable lung volumes. Stable cardiomegaly and mediastinal contours.  Linear and streaky perihilar opacity.  No pneumothorax or edema.  No large effusion.  IMPRESSION: 1.  Extubated.  Enteric tube removed. 2.  Stable lung volumes with perihilar atelectasis.  Original Report Authenticated By: Harley Hallmark, M.D.   Dg Chest Portable 1 View  07/27/2011  *RADIOLOGY REPORT*  Clinical Data: Assess endotracheal tube.  PORTABLE CHEST - 1 VIEW  Comparison: 07/26/2011  Findings: Endotracheal tube is 3.2 cm above the carina. Nasogastric tube extends into the abdomen.  Right jugular central venous catheter in the SVC region.  Left subclavian central line in the lower SVC.  There are persistent basilar densities suggesting a combination of atelectasis and pleural fluid.  The heart is enlarged for size.  Evidence of a CABG procedure.  IMPRESSION: Persistent basilar densities suggesting a combination of atelectasis and possibly pleural fluid.  Cardiomegaly.  Original Report Authenticated By: Richarda Overlie, M.D.   Dg Chest Portable 1 View  07/26/2011  *RADIOLOGY REPORT*  Clinical Data: Respiratory distress.  Intubated patient.  PORTABLE CHEST - 1 VIEW  Comparison: Chest 07/25/2011 and 07/24/2011.  Findings: Support tubes and lines are unchanged.  Cardiomegaly is again seen. The patient has small bilateral pleural effusions and basilar airspace disease, greater on the left.  Left basilar aeration shows some interval worsening.  No pneumothorax is identified.  IMPRESSION: Interval worsening in left basilar airspace disease.  Otherwise unchanged.  Original Report Authenticated By: Bernadene Bell. D'ALESSIO, M.D.   Dg Chest Portable 1 View  07/25/2011  *RADIOLOGY REPORT*  Clinical Data: Respiratory distress  PORTABLE CHEST - 1 VIEW  Comparison: 07/24/2011  Findings: Cardiomegaly again noted.  Stable  NG tube and endotracheal tube position. Stable right IJ central line position. Central mild vascular congestion without convincing edema.  Slight improvement in aeration.  Residual bilateral basilar atelectasis or infiltrate.  IMPRESSION:  Stable NG tube and endotracheal tube position. Stable right IJ central line position.  Central mild vascular congestion without convincing edema.  Slight improvement in aeration.  Residual bilateral basilar atelectasis or infiltrate.  Original Report Authenticated By: Natasha Mead, M.D.   Dg Chest Portable 1 View  07/24/2011  *RADIOLOGY REPORT*  Clinical Data: Pleural edema  PORTABLE CHEST - 1 VIEW  Comparison:   the previous day's study  Findings: Endotracheal tube, nasogastric tube, right IJ central line, and left arm central catheter are stable in position. Previous CABG.  Stable cardiomegaly.  Low lung volumes with patchy bibasilar atelectasis or consolidation, slightly increased since previous exam.  No definite effusion.  IMPRESSION:  1.  Low volumes with some worsening of bibasilar consolidation or atelectasis. 2. Support hardware stable in  position.  Original Report Authenticated By: Osa Craver, M.D.   Dg Chest Portable 1 View  07/23/2011  *RADIOLOGY REPORT*  Clinical Data: ETT insertion  PORTABLE CHEST - 1 VIEW  Comparison: 07/23/2011  Findings: Endotracheal tube terminates 5 cm above the carina. Enteric tube courses below the diaphragm.  Stable right IJ and left subclavian venous catheters.  Cardiomegaly with mild interstitial edema.  Bilateral lower lobe opacities, likely a combination of atelectasis and pleural effusions.  Postsurgical changes related to prior CABG.  IMPRESSION: Endotracheal tube terminates 5 cm above the carina.  Cardiomegaly with mild interstitial edema.  Bilateral lower lobe opacities, likely a combination of atelectasis and effusion.  Original Report Authenticated By: Charline Bills, M.D.   Dg Chest Portable 1 View  07/23/2011   *RADIOLOGY REPORT*  Clinical Data: Pulmonary edema.  Evaluate endotracheal tube position.  PORTABLE CHEST - 1 VIEW  Comparison: 07/22/2011 and 07/21/2011.  Findings: 0556 hours. Left arm PICC and right IJ central venous catheter are unchanged in position.  There is stable cardiomegaly with pulmonary edema and bilateral pleural effusions.  No pneumothorax is demonstrated.  There is no visible endotracheal tube.  IMPRESSION: Stable examination with persistent mild edema and bilateral pleural effusions.  Remaining support system is unchanged.  Original Report Authenticated By: Gerrianne Scale, M.D.   Dg Chest Portable 1 View  07/22/2011  *RADIOLOGY REPORT*  Clinical Data: Respiratory distress  PORTABLE CHEST - 1 VIEW  Comparison: 07/21/2011  Findings: Increase in diffuse bilateral airspace disease suggestive of pulmonary edema.  There is vascular congestion and bilateral pleural effusions.  Bibasilar atelectasis is slightly increased.  Endotracheal tube has been removed.  Right jugular catheter tip in the SVC.  Left subclavian catheter in the lower SVC.  IMPRESSION: Worsening congestive heart failure with pulmonary edema.  Slight increase in bilateral effusion and bibasilar atelectasis.  Original Report Authenticated By: Camelia Phenes, M.D.   Dg Chest Portable 1 View  07/21/2011  *RADIOLOGY REPORT*  Clinical Data: Assess ETT  PORTABLE CHEST - 1 VIEW  Comparison: 07/20/2011  Findings: Cardiomegaly with mild interstitial edema.  Right lower lobe opacity, likely a combination of atelectasis and effusion. Suspected small left pleural effusion.  No pneumothorax.  Postsurgical changes related to prior CABG.  Endotracheal tube terminates 6 cm above the carina.  Stable right IJ and left subclavian venous catheters.  Enteric tube courses below the diaphragm.  IMPRESSION: Endotracheal tube terminates 6 cm above the carina.  Cardiomegaly with mild interstitial edema and bilateral pleural effusions.  Original Report  Authenticated By: Charline Bills, M.D.   Dg Chest Portable 1 View  07/20/2011  *RADIOLOGY REPORT*  Clinical Data: History of pneumonia and respiratory distress.  PORTABLE CHEST - 1 VIEW  Comparison: 07/19/2011.  Findings: Tip of endotracheal tube terminates 3.5 cm above the carina.  Tip of left sided venous catheter terminates in the lower portion of the superior vena cava.  Tip of right internal jugular venous catheter terminates in the midportion of the superior vena cava.  Enteric tube is in place with its tip in the area of the distal esophagus.  This could be advanced into the stomach.  There is continued enlargement of the cardiac silhouette unchanged. There are low lung volumes.  There is vascular congestion pattern. Atelectasis and infiltrative densities are seen within the right mid and lower lung.  There is subsegmental atelectasis in the left midlung zone with some hazy atelectasis in left base.  There is indistinctness of the right  costophrenic angle most likely reflecting right pleural effusion unchanged.  There is continued opacity in the inferior left hemithorax with loss of definition of the margin of the left hemidiaphragm.  IMPRESSION: Position of tubes and catheters are described above.  The enteric tube is in the area of the distal esophagus.  It could be advanced into the stomach if clinically indicated.  There is continued enlargement of the cardiac silhouette unchanged.  There are low lung volumes.  There is vascular congestion pattern. Atelectasis and infiltrative densities are seen within the right mid and lower lung.  There is subsegmental atelectasis in the left midlung zone with some hazy atelectasis in left base.  There is indistinctness of the right costophrenic angle most likely reflecting right pleural effusion unchanged.  There is continued opacity in the inferior left hemithorax with loss of definition of the margin of the left hemidiaphragm. No significant change from prior  study.  Original Report Authenticated By: Crawford Givens, M.D.   Dg Chest Portable 1 View  07/19/2011  *RADIOLOGY REPORT*  Clinical Data: Patient on ventilator.  Line and tube placement. Shortness of breath.  PORTABLE CHEST - 1 VIEW  Comparison: 07/18/2011  Findings: Endotracheal tube has been placed, tip approximately 4 x 3 cm above carina.  Right IJ central line tip overlies the superior vena cava.  A second right IJ central line has been placed, parallel to the first, tip also in the superior vena cava. A left subclavian central line tip overlies the level of the superior vena cava.  Nasogastric tube is in place, side port in the region of the lower esophagus, tip to the level of the distal esophagus.  No evidence for pneumothorax.  The heart is enlarged.  There are bibasilar opacities, obscuring hemidiaphragms, right greater than left.  There are is a right pleural effusion, increased or more apparent.  IMPRESSION:  1.  Interval placement endotracheal tube and second right IJ central line. 2.  Interval placement of left subclavian central line. 3.  Increased right pleural effusion. 4.  Nasogastric tube to the distal esophagus.  Original Report Authenticated By: Patterson Hammersmith, M.D.   Dg Chest Portable 1 View  07/18/2011  *RADIOLOGY REPORT*  Clinical Data: Central line placement.  PORTABLE CHEST - 1 VIEW  Comparison: Chest 07/18/2011.  Findings: The patient has a new right IJ approach catheter with the tip in the lower superior vena cava.  No pneumothorax.  Marked cardiomegaly is again seen.  Pulmonary edema shows marked improvement.  Basilar atelectasis noted.  IMPRESSION:  1.  Right IJ catheter in good position.  No pneumothorax. 2.  Marked improvement in pulmonary edema.  Original Report Authenticated By: Bernadene Bell. D'ALESSIO, M.D.   Dg Chest Portable 1 View  07/18/2011  *RADIOLOGY REPORT*  Clinical Data: Follow-up CHF and shortness of breath.  PORTABLE CHEST - 1 VIEW  Comparison: 07/15/2011   Findings: Enlargement of the cardiac silhouette.  Increased densities in both lungs suggest airspace disease and suspect pleural fluid, right side greater than left.  No evidence for a large pneumothorax.  IMPRESSION: Cardiomegaly with evidence for pulmonary edema and pleural effusions.  Findings are most compatible with CHF.  Original Report Authenticated By: Richarda Overlie, M.D.   Dg Pneumonia Chest 2v  07/15/2011  *RADIOLOGY REPORT*  Clinical Data: Shortness of breath, weakness, acute bronchitis  CHEST - 2 VIEW  Comparison: 06/24/2011  Findings: Previous right PICC line removed.  Coronary bypass changes noted.  Heart is enlarged with central vascular congestion.  Band-like left midlung and bibasilar atelectasis noted, worse in the left lower lobe.  Small residual left effusion.  Upper lungs remain clear.  No pneumothorax.  Trachea is midline.  IMPRESSION: Cardiomegaly with vascular congestion, left mid lung and bibasilar atelectasis.  Residual smaller left effusion.  Original Report Authenticated By: Judie Petit. Ruel Favors, M.D.   Medications: Scheduled Meds:   . antiseptic oral rinse  15 mL Mouth Rinse QID  . cefTRIAXone (ROCEPHIN) IV  1 g Intravenous Q24H  . feeding supplement  237 mL Oral BID  . insulin aspart  0-9 Units Subcutaneous TID WC  . levothyroxine  75 mcg Oral QAC breakfast  . metronidazole  500 mg Intravenous Q8H  . protein supplement  30 mL Oral BID  . DISCONTD: chlorhexidine  15 mL Mouth/Throat Q12H   Continuous Infusions:   . sodium chloride 10 mL/hr at 08/03/11 0900  . sodium chloride 10 mL/hr at 08/03/11 0900  . milrinone 0.125 mcg/kg/min (08/03/11 0900)   PRN Meds:.sodium chloride, sodium chloride, acetaminophen, acetaminophen, calcium carbonate (dosed in mg elemental calcium), camphor-menthol, docusate sodium, feeding supplement (NEPRO CARB STEADY), haloperidol lactate, heparin, hydrOXYzine, lidocaine, lidocaine-prilocaine, morphine, ondansetron (ZOFRAN) IV,  pentafluoroprop-tetrafluoroeth, promethazine, promethazine, sorbitol, zolpidem, DISCONTD:  morphine injection, DISCONTD:  morphine injection  Assessment/Plan: 1.Severe Sepsis-Resolved 2.Chronic kidney disease-Dialysis as per nephrology 3.CHF/Cardiomyopathy-Not actively decompensating. 4.Atrial Fibribrillation-Rate under control. 5-DM-Continue accuchecks with regular insulin sliding scale. 6.HTN-Blood pressure is borderline. 7.Hypothyroidism 8.Decondtioning Discuss code status with patient today with patient been in his clear mind and he verbalise to me that he wants to be a  DNR   LOS: 19 days   Alacia Rehmann 08/03/2011, 2:29 PM

## 2011-08-03 NOTE — Progress Notes (Signed)
Pt transferred to this [facility] Team 10 from [facility]. CSW following pt for D/C back to SNF when medically ready for D/C. Jacklynn Lewis, MSW, [employer] [phone]

## 2011-08-03 NOTE — Progress Notes (Signed)
Subjective: Interval History: Awake, wants to know what's in store for today  Objective: Vital signs in last 24 hours: Temp:  [97.2 F (36.2 C)-98.8 F (37.1 C)] 98.1 F (36.7 C) (11/06 0800) Pulse Rate:  [92-113] 103  (11/06 0900) Resp:  [18-29] 22  (11/06 0900) BP: (76-105)/(53-77) 93/62 mmHg (11/06 0900) SpO2:  [92 %-100 %] 99 % (11/06 0900) FiO2 (%):  [6 %] 6 % (11/06 0000) Weight:  [109.6 kg (241 lb 10 oz)-110.3 kg (243 lb 2.7 oz)] 243 lb 2.7 oz (110.3 kg) (11/06 0451) Weight change: 0 kg (0 lb)  Intake/Output from previous day: 11/05 0701 - 11/06 0700 In: 1216.1 [P.O.:490; I.V.:476.1; IV Piggyback:250] Out: 4600 [Urine:380; Stool:20] Intake/Output this shift: Total I/O In: 300 [P.O.:100; IV Piggyback:200] Out: -   Neck: HD cath Right IJ Resp: diminished breath sounds bibasilar Cardio: no rub Male genitalia: normal, foley cathin Extremities: edema 2 + edema on R, 1+ on L: cellulitis R lat thigh and   Lab Results:  [facility] 08/03/11 0430 08/02/11 0454  WBC 10.0 9.9  HGB 9.5* 10.0*  HCT 30.0* 31.8*  PLT 159 193   BMET:  Basename 08/03/11 0430 08/02/11 0454  NA 137 134*  K 4.4 5.1  CL 99 96  CO2 28 25  GLUCOSE 161* 216*  BUN 63* 88*  CREATININE 2.31* 2.87*  CALCIUM 8.6 9.1   No results found for this basename: PTH:2 in the last 72 hours Iron Studies: No results found for this basename: IRON,TIBC,TRANSFERRIN,FERRITIN in the last 72 hours  Studies/Results: Dg Chest Portable 1 View  08/02/2011  *RADIOLOGY REPORT*  Clinical Data: Congestive heart failure and shortness of breath  PORTABLE CHEST - 1 VIEW  Comparison: 07/30/2011  Findings:  There is a left arm PICC line with tip in the SVC.  Right IJ catheter tip is in the SVC.  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  Heart size is enlarged.  Interval increase in pleural effusions with decreased aeration to both lung bases.  IMPRESSION:  1.  Increase in bilateral  pleural effusions and associated decreased aeration the lung bases  Original Report Authenticated By: Rosealee Albee, M.D.    Scheduled:   . antiseptic oral rinse  15 mL Mouth Rinse QID  . cefTRIAXone (ROCEPHIN) IV  1 g Intravenous Q24H  . feeding supplement  237 mL Oral BID  . insulin aspart  0-9 Units Subcutaneous TID WC  . levothyroxine  75 mcg Oral QAC breakfast  . metronidazole  500 mg Intravenous Q8H  . protein supplement  30 mL Oral BID  . DISCONTD: chlorhexidine  15 mL Mouth/Throat Q12H   Continuous:   . sodium chloride 10 mL/hr at 08/03/11 0900  . sodium chloride 10 mL/hr at 08/03/11 0900  . milrinone 0.125 mcg/kg/min (08/03/11 0900)   ZOX:WRUEAV chloride, sodium chloride, acetaminophen, acetaminophen, calcium carbonate (dosed in mg elemental calcium), camphor-menthol, docusate sodium, feeding supplement (NEPRO CARB STEADY), haloperidol lactate, heparin, hydrOXYzine, lidocaine, lidocaine-prilocaine, morphine, ondansetron (ZOFRAN) IV, pentafluoroprop-tetrafluoroeth, promethazine, promethazine, sorbitol, zolpidem, DISCONTD:  morphine injection, DISCONTD:  morphine injection  Assessment/Plan  Still appears vol. Expanded.  Oliguria persists. Will dialyze again today. Hgb low.  Last received IV fe on 22 Oct. Recheck Fe studies Remains on antibiotics for R LE cellulitis  LOS: 19 days   Jahzier Villalon F 08/03/2011,10:10 AM

## 2011-08-03 NOTE — Progress Notes (Signed)
Chaplain Note:  Chaplain visited with patient and provided spiritual comfort and support.  Pt was awake and very receptive to chaplain visit.  Discussed pt's outlook and hopes to make a final trip to [place].  Chaplain encouraged pt to keep that hope alive.  Pt expressed the desire for chaplain to make a follow up visit in the next two days.  Chaplain will follow up.  Verdie Shire, chaplain resident [phone])

## 2011-08-03 NOTE — Progress Notes (Signed)
08/03/11 2330 RT NOTE:  Pt placed on CPAP via AutoBiPAP mode.  5-12 cmH20 pressure setting.  3l Oxygen bled into circuit.  Pt tolerating therapy well with full facemask.    Jonna Clark RRT, RCP

## 2011-08-04 ENCOUNTER — Inpatient Hospital Stay (HOSPITAL_COMMUNITY): Payer: Medicare Other

## 2011-08-04 DIAGNOSIS — J9690 Respiratory failure, unspecified, unspecified whether with hypoxia or hypercapnia: Secondary | ICD-10-CM | POA: Diagnosis not present

## 2011-08-04 DIAGNOSIS — R7881 Bacteremia: Secondary | ICD-10-CM | POA: Diagnosis present

## 2011-08-04 DIAGNOSIS — L02415 Cutaneous abscess of right lower limb: Secondary | ICD-10-CM | POA: Clinically undetermined

## 2011-08-04 LAB — FERRITIN: Ferritin: 470 ng/mL — ABNORMAL HIGH (ref 22–322)

## 2011-08-04 LAB — GLUCOSE, CAPILLARY
Glucose-Capillary: 113 mg/dL — ABNORMAL HIGH (ref 70–99)
Glucose-Capillary: 179 mg/dL — ABNORMAL HIGH (ref 70–99)

## 2011-08-04 LAB — APTT: aPTT: 39 seconds — ABNORMAL HIGH (ref 24–37)

## 2011-08-04 MED ORDER — ALBUMIN HUMAN 25 % IV SOLN
INTRAVENOUS | Status: AC
  Administered 2011-08-04: 12.5 g via INTRAVENOUS
  Filled 2011-08-04: qty 50

## 2011-08-04 MED ORDER — LIDOCAINE HCL (PF) 1 % IJ SOLN
5.0000 mL | INTRAMUSCULAR | Status: DC | PRN
  Filled 2011-08-04: qty 5

## 2011-08-04 MED ORDER — MORPHINE SULFATE 2 MG/ML IJ SOLN: 1.0000 mg | INTRAMUSCULAR | Status: DC | PRN

## 2011-08-04 MED ORDER — ALBUMIN HUMAN 25 % IV SOLN
12.5000 g | Freq: Once | INTRAVENOUS | Status: AC
  Administered 2011-08-04: 12.5 g via INTRAVENOUS
  Filled 2011-08-04: qty 50

## 2011-08-04 MED ORDER — SACCHAROMYCES BOULARDII 250 MG PO CAPS
250.0000 mg | ORAL_CAPSULE | Freq: Two times a day (BID) | ORAL | Status: DC
  Administered 2011-08-04 – 2011-08-07 (×6): 250 mg via ORAL
  Filled 2011-08-04 (×11): qty 1

## 2011-08-04 NOTE — Consult Note (Signed)
  NP meet at bedside with patient first with LCSW-A Veronica Lett and then again with his sister Larene Beach to continue discussion rt GOC/EOL wishes disposition and options.  A detailed discussion was has again regarding his overall poor prognosis and the natural trajectory at end of life.  Attempted to clarify values and goals of care important to patient.   Patient Goals/Recommendation 10 DNR/DNI 2) transfer to the palliative care unit beginning a shift to a more comfort care approach 3) continue present medical interventions     -dc telemerty 4) symptom management 5) disposition dependent on outcomes over the next several days   Patient and family will continue to process this difficult situation.  Disposition option of residential hospice discussed.  PMT will continue to support holistically.  Total time spent on unit was 75 minutes timein 1245-time out 1400  Greater than 50% time spent in counseling and coordination of care.  Lorinda Creed NP #  [phone]

## 2011-08-04 NOTE — Progress Notes (Signed)
Clinical Social Worker received referral for patient will. Clinical Social Worker explained to pt family that Clinical Social Work can not witness a patients will or testament, and that Clinical Social Workers can not be a witness when notarizing a living will or health care power of attorney either. Pt asked who could witness, explaining the lawyer instructed her to have two witness that are not related to the patient witness patient signature. Clinical social worker advised patient sister she would have to locate two witnesses without the assitance of the hospital for this matter. Pt sister expressed her understanding of the mater. No further Clinical social work needs. Signing off.   Catha Gosselin, [place]  [phone]  16:41

## 2011-08-04 NOTE — Progress Notes (Signed)
0240 pt HR and RR increased after being on HD for approximately  1 hr.  Uf turned off, o2 increased d/t pt c/o increased SOB and pt repositioned.  Left UF off for approximately 14 min and then asked pt if I could turn UF back on.  Pt agreed.  Approximately [phone] Dr. Arrie Aran returned page and made aware of pt's intolerance of HD tx at times, and that pt is now hanging steady with HR in 120's and RR in 30's and has less c/o of SOB.  Orders received.   Will continue to monitor.  TSH

## 2011-08-04 NOTE — Consult Note (Signed)
Addendum to previous note:  This patient does wish to continue dialysis at this time!  Lorinda Creed NP

## 2011-08-04 NOTE — Progress Notes (Signed)
08/04/2011  Palliative Medicine Team SW 1:59 PM  Extensive conversation over multiple visits with pt and sister Chip Boer and with PMT-NP Lorinda Creed to confirm goals of care. Pt's mental status is questionable at times; is generally able to carry on conversation but admits to trouble with short term memory.    Extensive discussion with pt regarding his illness, the reality of his poor prognosis, and which goals and values are realistic for pt. Pt expresses anxiety about returning to current SNF and is slowly coming to understanding that his body cannot "be fixed" and that our focus should be on maximizing  his quality of life.   Pt expresses unresolved grief over mother's recent death, though he gives varying dates for this. Pt reports employment history as a [employer] and his love for his two cats, [employer]ing[employer]'s writings, his sister Chip Boer and friend Roe Coombs. Pt expresses feelings of abandonment by his friends, whom he reports have not visited while he is hospitalized. Pt expresses primary fears as "dying like an old person", "being put away somewhere" and unable to have family and friends visit.   Discussed the potential of reframing pt's hopes and final wishes and gave education about residential hospice as a potential disposition option. Current plan is for pt to transfer to Palliative Care Unit with understanding that there will not be telemetry or further titration of milrinone. Pt expressed desire to continue dialysis during admission as tolerated. Pt and Chip Boer express interest in, but need for time to process the implications of a residential hospice placement.   Attending MD and team CSW aware of this plan. Will continue to follow for emotional support  Kennieth Francois, Theresia Majors  Pager [phone]

## 2011-08-04 NOTE — Progress Notes (Signed)
Patient oriented at times other times does not recognize nurse, and is confused, at other times alert and oriented. Patient stated at lunch time "are those ants there on the table" after reassurance that there were no ants, patient stated "I have been seeing ants and bugs for the past few days" Patient reassured, meal tray provided and set up.  No questions, needs or concerns verbalized.

## 2011-08-04 NOTE — Progress Notes (Signed)
Report given to receiving unit 4500 RN, patient aware  Of plan to transfer and room number to give family as desired. IV medications infusing without difficulty, flexi-seal and foley intact and to gravity drain. Pt shows no sign of distress at this time. verbalizes no needs.

## 2011-08-04 NOTE — Progress Notes (Signed)
SUBJECTIVE: More alert and mood improved.  Chronically ill, weak, dyspnea.  Leg pain less pronounced.  Filed Vitals:   08/16/2011 0430 08/16/2011 0500 2011/08/16 0530 2011/08/16 0535  BP: 100/60 101/48 98/54 102/52  Pulse: 105 107 104 106  Temp:      TempSrc:      Resp: 23 23 30 22   Height:      Weight:  109.1 kg (240 lb 8.4 oz)  109.1 kg (240 lb 8.4 oz)  SpO2:    96%    Intake/Output Summary (Last 24 hours) at 08/16/2011 0717 Last data filed at 16-Aug-2011 0532  Gross per 24 hour  Intake 1103.3 ml  Output   4251 ml  Net -3147.7 ml    LABS: Lab Results  Component Value Date   WBC 10.0 08/03/2011   HGB 9.5* 08/03/2011   HCT 30.0* 08/03/2011   PLT 159 08/03/2011   GLUCOSE 161* 08/03/2011   CHOL 91 05/02/2011   TRIG 81 05/02/2011   HDL 24* 05/02/2011   LDLCALC 51 05/02/2011   ALT 11 08/02/2011   AST 13 08/02/2011   NA 137 08/03/2011   K 4.4 08/03/2011   CL 99 08/03/2011   CREATININE 2.31* 08/03/2011   BUN 63* 08/03/2011   CO2 28 08/03/2011   TSH 13.607* 06/24/2011   PSA 0.50 04/24/2010   INR 1.64* 08/02/2011   HGBA1C 6.7* 05/02/2011     PHYSICAL EXAM General:  Chronically ill but in no acute distress Lungs:   Decreased breath sounds with bibasilar crackles Heart:  Tachy, distant heart sounds Abdomen:  No rebound, no guarding Extremities:  Edema unchanged.   ASSESSMENT AND PLAN:  Active Problems:  DIABETES MELLITUS, TYPE II, UNCONTROLLED, WITH COMPLICATIONS  OBSTRUCTIVE SLEEP APNEA  CARDIOMYOPATHY, ISCHEMIC  Chronic systolic heart failure  DEEP VENOUS THROMBOPHLEBITIS, LEG, RIGHT  Atrial fibrillation  Severe sepsis  Acute renal failure  Adult failure to thrive   CHF:  I will slowly reduce the Milrinone  Volume management per dialysis as BP allows.  I agree that the prognosis is very poor and palliative care would be appropriate.   Unable to titrate any further meds at this point with his hypotension.    Atrial fibrillation:  Paroxysms but for the most part sinus.  Continue po  amiodarone.  Fayrene Fearing [facility] 08/16/2011 7:17 AM

## 2011-08-04 NOTE — Progress Notes (Signed)
This patient was discussed at long LOS rounds this morning. 

## 2011-08-04 NOTE — Progress Notes (Signed)
Subjective: No major complaints, anxious about family meeting due today.  Objective: Vital signs in last 24 hours: Temp:  [96.1 F (35.6 C)-98.4 F (36.9 C)] 97.6 F (36.4 C) (11/07 0800) Pulse Rate:  [92-121] 106  (11/07 0535) Resp:  [15-35] 22  (11/07 0535) BP: (89-121)/(41-76) 102/52 mmHg (11/07 0535) SpO2:  [89 %-100 %] 96 % (11/07 0535) Weight:  [109.1 kg (240 lb 8.4 oz)-112.8 kg (248 lb 10.9 oz)] 240 lb 8.4 oz (109.1 kg) (11/07 0535) Weight change: 1.3 kg (2 lb 13.9 oz) Body mass index is 35.02 kg/(m^2).  Intake/Output from previous day: 11/06 0701 - 11/07 0700 In: 1103.3 [P.O.:340; I.V.:413.3; IV Piggyback:350] Out: 4251 [Urine:245]   PHYSICAL EXAM: Gen Exam: Awake and alert with clear speech.   Neck: Supple, No JVD.   Chest: B/L has bibasilar rales.   CVS: S1 S2 Regular, no murmurs.  Abdomen: soft, BS +, non tender, obese. Extremities: + edema, right lateral thigh still erythematous and mildly tender Neurologic: Non Focal.   Skin: No Rash.   Wounds: N/A.    Lab Results:  Basename 08/03/11 0430 08/02/11 0454  WBC 10.0 9.9  HGB 9.5* 10.0*  HCT 30.0* 31.8*  PLT 159 193   CMET CMP     Component Value Date/Time   NA 137 08/03/2011 0430   K 4.4 08/03/2011 0430   CL 99 08/03/2011 0430   CO2 28 08/03/2011 0430   GLUCOSE 161* 08/03/2011 0430   BUN 63* 08/03/2011 0430   CREATININE 2.31* 08/03/2011 0430   CALCIUM 8.6 08/03/2011 0430   PROT 5.9* 08/02/2011 0454   ALBUMIN 2.3* 08/03/2011 0430   AST 13 08/02/2011 0454   ALT 11 08/02/2011 0454   ALKPHOS 166* 08/02/2011 0454   BILITOT 0.5 08/02/2011 0454   GFRNONAA 29* 08/03/2011 0430   GFRAA 33* 08/03/2011 0430    LIPIDS @LASTLIPID @ @LASTBNP @ COAGS  Basename 08/02/11 0454  INR 1.64*    Studies/Results: No results found.  Medications:  Scheduled:   . albumin human  12.5 g Intravenous Once  . antiseptic oral rinse  15 mL Mouth Rinse QID  . cefTRIAXone (ROCEPHIN) IV  1 g Intravenous Q24H  . feeding supplement   237 mL Oral BID  . insulin aspart  0-9 Units Subcutaneous TID WC  . levothyroxine  75 mcg Oral QAC breakfast  . metronidazole  500 mg Intravenous Q8H  . protein supplement  30 mL Oral BID    Assessment/Plan: Active Problems:  1.Severe sepsis: This is secondary to Klebsiella Bacteremia and and right thigh abscess which is also growing Klebsiella. Patient continues to be on Rocephin, will plan atleast a 14 day course. Cdiff Colitis is playing a role as well.   2.Acute renal failure Still oliguric, S/P Hemodialysis today morning, however tolerance to dialysis continues to be problematic with episodes of hypotension and tachycardia.  3. Acute on Chronic Systolic Heart Failure: Cardiology following, On Milrinone, Diuresis as patient tolerates Dialysis  4. DIABETES MELLITUS, TYPE II, UNCONTROLLED, WITH COMPLICATIONS: Continue with SSI  5. OBSTRUCTIVE SLEEP APNEA: Stable  6 CARDIOMYOPATHY, ISCHEMIC Stable at present   7.DEEP VENOUS THROMBOPHLEBITIS, LEG, RIGHT Coumadin on hold  8. Atrial fibrillation Paroxysmal-Per cards  9. Abscess of leg, right Continue with Rocephin, monitor for now, may need re-imaging at some point.   10.Bacteremia due to Gram-negative bacteria Continue with Rocephin.   11. CDiff Colitis: Continue with Flagyl.Some diarrhea still persists.   11.Respiratory failure: is S/P VDRF Currently stable  12. Dispo: Remain in SDU,  await meeting with family today for further goals of care.  13. Code Status: DNR.    Maretta Bees, MD. 08/04/2011, 9:34 AM

## 2011-08-04 NOTE — Progress Notes (Signed)
Subjective: Awake, alert, says breathing is easier  Objective: Vital signs in last 24 hours: Temp:  [96.1 F (35.6 C)-98.4 F (36.9 C)] 97.6 F (36.4 C) (11/07 0800) Pulse Rate:  [92-121] 106  (11/07 0535) Resp:  [15-35] 22  (11/07 0535) BP: (89-121)/(41-76) 102/52 mmHg (11/07 0535) SpO2:  [89 %-100 %] 96 % (11/07 0535) Weight:  [109.1 kg (240 lb 8.4 oz)-112.8 kg (248 lb 10.9 oz)] 240 lb 8.4 oz (109.1 kg) (11/07 0535) Weight change: 1.3 kg (2 lb 13.9 oz)  Intake/Output from previous day: 11/06 0701 - 11/07 0700 In: 1103.3 [P.O.:340; I.V.:413.3; IV Piggyback:350] Out: 4251 [Urine:245] 4L removed with dialysis Intake/Output this shift:    VS noted above BP low Cath in R IJ Crackles in bases Bilat Cor-No rub Abd-protuberant Extr--still with edema and cellulitis R thigh  Lab Results:  [facility] 08/03/11 0430 08/02/11 0454  WBC 10.0 9.9  HGB 9.5* 10.0*  HCT 30.0* 31.8*  PLT 159 193   BMET:  Basename 08/03/11 0430 08/02/11 0454  NA 137 134*  K 4.4 5.1  CL 99 96  CO2 28 25  GLUCOSE 161* 216*  BUN 63* 88*  CREATININE 2.31* 2.87*  CALCIUM 8.6 9.1   No results found for this basename: PTH:2 in the last 72 hours Iron Studies: No results found for this basename: IRON,TIBC,TRANSFERRIN,FERRITIN in the last 72 hours  Studies/Results: No results found.  Scheduled:   . albumin human  12.5 g Intravenous Once  . antiseptic oral rinse  15 mL Mouth Rinse QID  . cefTRIAXone (ROCEPHIN) IV  1 g Intravenous Q24H  . feeding supplement  237 mL Oral BID  . insulin aspart  0-9 Units Subcutaneous TID WC  . levothyroxine  75 mcg Oral QAC breakfast  . metronidazole  500 mg Intravenous Q8H  . protein supplement  30 mL Oral BID  . DISCONTD: chlorhexidine  15 mL Mouth/Throat Q12H   Continuous:   . sodium chloride 10 mL/hr at 08/03/11 1700  . sodium chloride 10 mL/hr at 08/03/11 1700  . milrinone 0.0625 mcg/kg/min (08/04/11 0732)   ZOX:WRUEAV chloride, sodium chloride,  acetaminophen, acetaminophen, calcium carbonate (dosed in mg elemental calcium), camphor-menthol, docusate sodium, feeding supplement (NEPRO CARB STEADY), haloperidol lactate, heparin, hydrOXYzine, lidocaine, lidocaine-prilocaine, morphine, ondansetron (ZOFRAN) IV, pentafluoroprop-tetrafluoroeth, promethazine, promethazine, sorbitol, zolpidem  Assessment/Plan: Family conference today to determine goals of care.  Will hold further dialysis/CVVH until decisions have been made. Check lab in AM and CXR today   LOS: 20 days   Jaysa Kise F 08/04/2011,9:19 AM

## 2011-08-05 ENCOUNTER — Inpatient Hospital Stay (HOSPITAL_COMMUNITY): Payer: Medicare Other

## 2011-08-05 LAB — GLUCOSE, CAPILLARY
Glucose-Capillary: 145 mg/dL — ABNORMAL HIGH (ref 70–99)
Glucose-Capillary: 187 mg/dL — ABNORMAL HIGH (ref 70–99)

## 2011-08-05 LAB — CBC
MCH: 29.1 pg (ref 26.0–34.0)
MCHC: 31.7 g/dL (ref 30.0–36.0)
MCV: 91.7 fL (ref 78.0–100.0)
Platelets: 148 10*3/uL — ABNORMAL LOW (ref 150–400)
RBC: 3.27 MIL/uL — ABNORMAL LOW (ref 4.22–5.81)
RDW: 20.5 % — ABNORMAL HIGH (ref 11.5–15.5)

## 2011-08-05 LAB — RENAL FUNCTION PANEL
BUN: 64 mg/dL — ABNORMAL HIGH (ref 6–23)
Chloride: 101 mEq/L (ref 96–112)
Glucose, Bld: 147 mg/dL — ABNORMAL HIGH (ref 70–99)
Potassium: 4.5 mEq/L (ref 3.5–5.1)

## 2011-08-05 MED ORDER — DARBEPOETIN ALFA-POLYSORBATE 200 MCG/0.4ML IJ SOLN
INTRAMUSCULAR | Status: AC
  Administered 2011-08-05: 200 ug via INTRAVENOUS
  Filled 2011-08-05: qty 0.4

## 2011-08-05 MED ORDER — SODIUM CHLORIDE 0.9 % IJ SOLN
10.0000 mL | Freq: Two times a day (BID) | INTRAMUSCULAR | Status: DC
  Administered 2011-08-05 – 2011-08-07 (×2): 10 mL

## 2011-08-05 MED ORDER — SODIUM CHLORIDE 0.9 % IJ SOLN
25.0000 mg | Freq: Once | INTRAVENOUS | Status: AC
  Administered 2011-08-05: 25 mg via INTRAVENOUS
  Filled 2011-08-05: qty 2

## 2011-08-05 MED ORDER — SODIUM CHLORIDE 0.9 % IJ SOLN
10.0000 mL | INTRAMUSCULAR | Status: DC | PRN
  Administered 2011-08-06 – 2011-08-07 (×2): 10 mL

## 2011-08-05 MED ORDER — DARBEPOETIN ALFA-POLYSORBATE 200 MCG/0.4ML IJ SOLN
200.0000 ug | INTRAMUSCULAR | Status: DC
  Administered 2011-08-05: 200 ug via INTRAVENOUS
  Filled 2011-08-05: qty 0.4

## 2011-08-05 MED ORDER — METRONIDAZOLE 500 MG PO TABS
500.0000 mg | ORAL_TABLET | Freq: Three times a day (TID) | ORAL | Status: AC
  Administered 2011-08-05 (×3): 500 mg via ORAL
  Filled 2011-08-05 (×3): qty 1

## 2011-08-05 MED ORDER — SODIUM CHLORIDE 0.9 % IJ SOLN
125.0000 mg | INTRAVENOUS | Status: DC
  Administered 2011-08-07: 125 mg via INTRAVENOUS
  Filled 2011-08-05 (×2): qty 10

## 2011-08-05 NOTE — Progress Notes (Signed)
08/05/2011 Palliative Medicine Team SW 2:03 PM  Attempted follow up visit for psychosocial support to pt; pt receiving personal care earlier, and now in Dialysis. Will follow up tomorrow.   Kennieth Francois, [place]  Pager [phone]

## 2011-08-05 NOTE — Progress Notes (Signed)
CBC. Renal, ptt

## 2011-08-05 NOTE — Progress Notes (Signed)
SUBJECTIVE:  Weak but alert and oriented.  Denies chest pain. Mild SOB.  Mild leg pain.   PHYSICAL EXAM Filed Vitals:   08/04/11 1700 08/04/11 2030 08/04/11 2355 08/05/11 0453  BP:  96/64  108/72  Pulse:  103 92 98  Temp: 97.7 F (36.5 C) 98.4 F (36.9 C)  96.4 F (35.8 C)  TempSrc: Oral Oral  Oral  Resp:  24 20 22   Height:      Weight:      SpO2:  95% 93% 95%   General:  Weak Lungs:  Decreased BS, no wheezing Heart:  RRR (with ectopy) Abdomen:  Positive bowel sounds, no rebound no guarding Extremities:  Moderate edema and right thigh erythema.  LABS: Lab Results  Component Value Date   BUN 63* 08/03/2011  , Lab Results  Component Value Date   CREATININE 2.31* 08/03/2011  , Lab Results  Component Value Date   NA 137 08/03/2011   K 4.4 08/03/2011   CL 99 08/03/2011   CO2 28 08/03/2011   Lab Results  Component Value Date   WBC 10.0 08/03/2011   HGB 9.5* 08/03/2011   HCT 30.0* 08/03/2011   MCV 89.0 08/03/2011   PLT 159 08/03/2011   Lab Results  Component Value Date   CKTOTAL 44 07/18/2011   CKMB 4.9* 06/25/2011   TROPONINI <0.30 07/15/2011     Intake/Output Summary (Last 24 hours) at 08/05/11 0747 Last data filed at 08/05/11 0454  Gross per 24 hour  Intake   1010 ml  Output    860 ml  Net    150 ml    ASSESSMENT AND PLAN:  Active Problems:  DIABETES MELLITUS, TYPE II, UNCONTROLLED, WITH COMPLICATIONS  OBSTRUCTIVE SLEEP APNEA  CARDIOMYOPATHY, ISCHEMIC  Chronic systolic heart failure  DEEP VENOUS THROMBOPHLEBITIS, LEG, RIGHT  Atrial fibrillation  Severe sepsis  Acute renal failure  Adult failure to thrive  Abscess of leg, right  Bacteremia due to Gram-negative bacteria  Respiratory failure   CHF:  OK to d/c Milrinone.  He is on a very [place] dose.  Continue comfort measures.  Fayrene Fearing [facility] 08/05/2011 7:47 AM

## 2011-08-05 NOTE — Progress Notes (Signed)
S: Pt transferred to Palliative care unit yesterday, but tells me he wants to continue dialysis; gets SOB with minimal exertion; has not been OOB Says if it "gets too hard I'kll just pull the plug"  O:Awaker, alert, "chipper" BP 108/72  Pulse 98  Temp(Src) 96.4 F (35.8 C) (Oral)  Resp 22  Ht 5' 9.49" (1.765 m)  Wt 109.1 kg (240 lb 8.4 oz)  BMI 35.02 kg/m2  SpO2 95%   Intake/Output Summary (Last 24 hours) at 08/05/11 1107 Last data filed at 08/05/11 0454  Gross per 24 hour  Intake    560 ml  Output    860 ml  Net   -300 ml    Weight change:  Scheduled Meds:   . antiseptic oral rinse  15 mL Mouth Rinse QID  . cefTRIAXone (ROCEPHIN) IV  1 g Intravenous Q24H  . feeding supplement  237 mL Oral BID  . insulin aspart  0-9 Units Subcutaneous TID WC  . levothyroxine  75 mcg Oral QAC breakfast  . metroNIDAZOLE  500 mg Oral Q8H  . protein supplement  30 mL Oral BID  . saccharomyces boulardii  250 mg Oral BID  . sodium chloride  10 mL Intracatheter Q12H  . DISCONTD: metronidazole  500 mg Intravenous Q8H   Continuous Infusions:   . sodium chloride 10 mL/hr at 08/03/11 1700  . sodium chloride 10 mL/hr at 08/03/11 1700  . DISCONTD: milrinone 0.0625 mcg/kg/min (08/04/11 0732)   PRN Meds:.sodium chloride, sodium chloride, acetaminophen, acetaminophen, calcium carbonate (dosed in mg elemental calcium), camphor-menthol, docusate sodium, feeding supplement (NEPRO CARB STEADY), haloperidol lactate, heparin, hydrOXYzine, lidocaine, lidocaine-prilocaine, morphine, morphine, ondansetron (ZOFRAN) IV, pentafluoroprop-tetrafluoroeth, promethazine, promethazine, sodium chloride, sorbitol, zolpidem, DISCONTD: lidocaine DISCONTD:  morphine injection  ZOX:WRUEA anasarca with pitting edema le's, abdominal wall, arms Temporary IJ catheter in right neck Decreased breath sounds Bilat LE edema/anasarca as above   Basic Metabolic Panel:  Basename 08/05/11 0815 08/03/11 0430  NA 137 137  K 4.5 4.4   CL 101 99  CO2 27 28  GLUCOSE 147* 161*  BUN 64* 63*  CREATININE 2.49* 2.31*  CALCIUM 8.8 8.6  MG -- --  PHOS 4.4 3.8   Liver Function Tests:  Basename 08/05/11 0815 08/03/11 0430  AST -- --  ALT -- --  ALKPHOS -- --  BILITOT -- --  PROT -- --  ALBUMIN 2.4* 2.3*   CBC:  Basename 08/05/11 0815 08/03/11 0430  WBC 8.8 10.0  NEUTROABS -- --  HGB 9.5* 9.5*  HCT 30.0* 30.0*  MCV 91.7 89.0  PLT 148* 159   Anemia Panel:  Basename 08/04/11 0205  VITAMINB12 --  FOLATE --  FERRITIN 470*  TIBC 152*  IRON 25*  RETICCTPCT --     Dg Chest Portable 1 View  08/05/2011  *RADIOLOGY REPORT*  Clinical Data: Shortness of breath, weakness  PORTABLE CHEST - 1 VIEW  Comparison: 08/02/2011  Findings: Bilateral lower lobe opacities, likely atelectasis.  Left mid lung/perihilar opacity, atelectasis versus pneumonia. Pulmonary vascular congestion without frank interstitial edema.  No pneumothorax is seen.  Cardiomegaly. Postsurgical changes related to prior CABG.  Right IJ venous catheter and left subclavian venous catheter, unchanged.  IMPRESSION: Bilateral lower lobe atelectasis.  Left mid lung/perihilar opacity, atelectasis versus pneumonia.  Original Report Authenticated By: Charline Bills, M.D.   Assessment/Plan:  1. AKI on CKD (sepsis, cardiorenal syndrome); remains grossly volume overloaded by exam despite creatinine in the 2's (baseline 2) but numbers just reflect dialysis treatments; needs additonal ultrafiltration.  May not tolerate off inotropic support. He will need perm-cath if dialysis is to continue.  Will plan a treatment today to see how tolerates additional fluid removal. If dialysis turns out to be long term, he will like need LTAC as he would need to be able to transfer, sit in a chair for at least 6 hours - currently would not be appropriate for long term dialysis.   I'm not sure how realistic he is about this, or whether he truly understands the implications.  2.Cellulitis  right thigh/Klebsiella bacteremia - still quite a bit of edema and redness  On rocephin 3. Anemia start Aranesp.  Check iron studies 4. Ischemic cardiomyopathy/chronic systolic heart failure 5. OSA 6.  C diff colitis - still has rectal tube/loose stool; completed course of flagyl 7.  DNR status  Talea Manges B

## 2011-08-05 NOTE — Progress Notes (Signed)
Subjective: No major complaints, now in the palliative care unit.  Objective: Vital signs in last 24 hours: Temp:  [96.4 F (35.8 C)-98.4 F (36.9 C)] 96.4 F (35.8 C) (11/08 0453) Pulse Rate:  [92-103] 98  (11/08 0453) Resp:  [20-24] 22  (11/08 0453) BP: (96-108)/(64-72) 108/72 mmHg (11/08 0453) SpO2:  [93 %-95 %] 95 % (11/08 0453) Weight change:  Body mass index is 35.02 kg/(m^2).  Intake/Output from previous day: 11/07 0701 - 11/08 0700 In: 1010 [P.O.:1010] Out: 860 [Urine:860]   PHYSICAL EXAM: Gen Exam: Awake and alert with clear speech.   Neck: Supple, No JVD.   Chest: B/L has bibasilar rales.  Some scattered rhonchi as well. CVS: S1 S2 Regular, no murmurs.  Abdomen: soft, BS +, non tender, obese. Extremities: + edema, right lateral thigh still erythematous and mildly tender, but unchanged from yesterday. Neurologic: Non Focal.   Skin: No Rash.   Wounds: N/A.    Lab Results:  Basename 08/05/11 0815 08/03/11 0430  WBC 8.8 10.0  HGB 9.5* 9.5*  HCT 30.0* 30.0*  PLT 148* 159   CMET CMP     Component Value Date/Time   NA 137 08/05/2011 0815   K 4.5 08/05/2011 0815   CL 101 08/05/2011 0815   CO2 27 08/05/2011 0815   GLUCOSE 147* 08/05/2011 0815   BUN 64* 08/05/2011 0815   CREATININE 2.49* 08/05/2011 0815   CALCIUM 8.8 08/05/2011 0815   PROT 5.9* 08/02/2011 0454   ALBUMIN 2.4* 08/05/2011 0815   AST 13 08/02/2011 0454   ALT 11 08/02/2011 0454   ALKPHOS 166* 08/02/2011 0454   BILITOT 0.5 08/02/2011 0454   GFRNONAA 26* 08/05/2011 0815   GFRAA 30* 08/05/2011 0815    LIPIDS @LASTLIPID @ @LASTBNP @ COAGS No results found for this basename: PT:2,INR:2 in the last 72 hours  Studies/Results: Dg Chest Portable 1 View  08/05/2011  *RADIOLOGY REPORT*  Clinical Data: Shortness of breath, weakness  PORTABLE CHEST - 1 VIEW  Comparison: 08/02/2011  Findings: Bilateral lower lobe opacities, likely atelectasis.  Left mid lung/perihilar opacity, atelectasis versus pneumonia. Pulmonary  vascular congestion without frank interstitial edema.  No pneumothorax is seen.  Cardiomegaly. Postsurgical changes related to prior CABG.  Right IJ venous catheter and left subclavian venous catheter, unchanged.  IMPRESSION: Bilateral lower lobe atelectasis.  Left mid lung/perihilar opacity, atelectasis versus pneumonia.  Original Report Authenticated By: Charline Bills, M.D.    Medications:  Scheduled:    . antiseptic oral rinse  15 mL Mouth Rinse QID  . cefTRIAXone (ROCEPHIN) IV  1 g Intravenous Q24H  . darbepoetin (ARANESP) injection - DIALYSIS  200 mcg Intravenous Q Thu-HD  . feeding supplement  237 mL Oral BID  . ferric glucontate (NULECIT) IV  125 mg Intravenous Q T,Th,Sa-HD  . ferric glucontate (FERRLECIT/NULECIT TEST DOSE) 25 mg IV  25 mg Intravenous Once  . insulin aspart  0-9 Units Subcutaneous TID WC  . levothyroxine  75 mcg Oral QAC breakfast  . metroNIDAZOLE  500 mg Oral Q8H  . protein supplement  30 mL Oral BID  . saccharomyces boulardii  250 mg Oral BID  . sodium chloride  10 mL Intracatheter Q12H  . DISCONTD: metronidazole  500 mg Intravenous Q8H    Assessment/Plan: Active Problems:  1.Severe sepsis: This is stable and appears to be resolving slowly. This is secondary to Klebsiella Bacteremia and and right thigh abscess which is also growing Klebsiella. Patient continues to be on Rocephin, will plan atleast a 14 day course. Cdiff  Colitis is playing a role as well.   2.Acute renal failure -His urine output is better today. However he still appears volume overloaded. -Appreciated renal followup, plan for ultrafiltration or dialysis today. -Patient continues to desire hemodialysis for now, we will reevaluate in the next few days, depending on how he continues to tolerate this or not.  3. Acute on Chronic Systolic Heart Failure: Cardiology following Milrinone discontinued by cardiology. Will see if patient can tolerate hemodialysis off inotropic support.  4.  DIABETES MELLITUS, TYPE II, UNCONTROLLED, WITH COMPLICATIONS: Continue with SSI, this is stable  5. OBSTRUCTIVE SLEEP APNEA: Stable, continue with CPAP as needed at night.  6 CARDIOMYOPATHY, ISCHEMIC Stable at present, we'll see if patient can tolerate hemodialysis on the milrinone.   7.DEEP VENOUS THROMBOPHLEBITIS, LEG, RIGHT We'll continue to hold Coumadin, we will reassess  daily and only continue this long-term if patient's long-term prognosis  is good.  8. Atrial fibrillation Paroxysmal-Per cards Now off telemetry and in the palliative care unit.  9. Abscess of leg, right Continue with Rocephin, monitor for now, may need re-imaging at some point.   10.Bacteremia due to Gram-negative bacteria Continue with Rocephin.   11. CDiff Colitis: Continue with Flagyl.Some diarrhea still persists. Would discontinue rectal tube.   11.Respiratory failure: is S/P VDRF Currently stable  12. Dispo: Remain inpatient, will follow clinical course to see if patient would require hospice home or back to skilled nursing facility with palliative care followup.  13. Code Status: DNR.    Maretta Bees, MD. 08/05/2011, 1:20 PM

## 2011-08-05 NOTE — Progress Notes (Signed)
Pt seen on HD Ap 110 Vp 140 BFR 400 BP 101/67

## 2011-08-06 ENCOUNTER — Inpatient Hospital Stay (HOSPITAL_COMMUNITY): Payer: Medicare Other

## 2011-08-06 ENCOUNTER — Other Ambulatory Visit: Payer: Self-pay | Admitting: Nephrology

## 2011-08-06 ENCOUNTER — Other Ambulatory Visit (HOSPITAL_COMMUNITY): Payer: Medicare Other

## 2011-08-06 LAB — GLUCOSE, CAPILLARY: Glucose-Capillary: 151 mg/dL — ABNORMAL HIGH (ref 70–99)

## 2011-08-06 LAB — CBC
MCH: 28.7 pg (ref 26.0–34.0)
MCHC: 30.9 g/dL (ref 30.0–36.0)
MCV: 92.8 fL (ref 78.0–100.0)
Platelets: 112 10*3/uL — ABNORMAL LOW (ref 150–400)

## 2011-08-06 LAB — RENAL FUNCTION PANEL
Albumin: 2.5 g/dL — ABNORMAL LOW (ref 3.5–5.2)
BUN: 55 mg/dL — ABNORMAL HIGH (ref 6–23)
Creatinine, Ser: 2.42 mg/dL — ABNORMAL HIGH (ref 0.50–1.35)
Phosphorus: 4.6 mg/dL (ref 2.3–4.6)

## 2011-08-06 LAB — PROTIME-INR: Prothrombin Time: 18.2 seconds — ABNORMAL HIGH (ref 11.6–15.2)

## 2011-08-06 MED ORDER — HEPARIN SODIUM (PORCINE) 5000 UNIT/ML IJ SOLN
5000.0000 [IU] | Freq: Three times a day (TID) | INTRAMUSCULAR | Status: DC
  Administered 2011-08-06 – 2011-08-07 (×3): 5000 [IU] via SUBCUTANEOUS
  Filled 2011-08-06 (×9): qty 1

## 2011-08-06 MED ORDER — MIDAZOLAM HCL 2 MG/2ML IJ SOLN
INTRAMUSCULAR | Status: AC
  Filled 2011-08-06: qty 4

## 2011-08-06 MED ORDER — FENTANYL CITRATE 0.05 MG/ML IJ SOLN
INTRAMUSCULAR | Status: AC
  Filled 2011-08-06: qty 4

## 2011-08-06 NOTE — Progress Notes (Signed)
S: No major complaints; to have temporary IJ replaced with permcath today; Had dialysis yesterday (I cannot find pre/post dialysis weights)  O:   BP 101/77  Pulse 116  Temp(Src) 95.2 F (35.1 C) (Axillary)  Resp 22  Ht 5' 9.49" (1.765 m)  Wt 109.1 kg (240 lb 8.4 oz)  BMI 35.02 kg/m2  SpO2 94%   Intake/Output Summary (Last 24 hours) at 08/06/11 1143 Last data filed at 08/06/11 0542  Gross per 24 hour  Intake      0 ml  Output   3375 ml  Net  -3375 ml    Weight change:  Scheduled Meds:   . antiseptic oral rinse  15 mL Mouth Rinse QID  . cefTRIAXone (ROCEPHIN) IV  1 g Intravenous Q24H  . darbepoetin (ARANESP) injection - DIALYSIS  200 mcg Intravenous Q Thu-HD  . feeding supplement  237 mL Oral BID  . ferric glucontate (NULECIT) IV  125 mg Intravenous Q T,Th,Sa-HD  . ferric glucontate (FERRLECIT/NULECIT TEST DOSE) 25 mg IV  25 mg Intravenous Once  . heparin subcutaneous  5,000 Units Subcutaneous Q8H  . insulin aspart  0-9 Units Subcutaneous TID WC  . levothyroxine  75 mcg Oral QAC breakfast  . metroNIDAZOLE  500 mg Oral Q8H  . protein supplement  30 mL Oral BID  . saccharomyces boulardii  250 mg Oral BID  . sodium chloride  10 mL Intracatheter Q12H   Continuous Infusions:   . sodium chloride 10 mL/hr at 08/03/11 1700  . sodium chloride 10 mL/hr at 08/03/11 1700   PRN Meds:.sodium chloride, sodium chloride, acetaminophen, acetaminophen, calcium carbonate (dosed in mg elemental calcium), camphor-menthol, docusate sodium, feeding supplement (NEPRO CARB STEADY), haloperidol lactate, heparin, hydrOXYzine, lidocaine, lidocaine-prilocaine, morphine, morphine, ondansetron (ZOFRAN) IV, pentafluoroprop-tetrafluoroeth, promethazine, promethazine, sodium chloride, sorbitol, zolpidem  Gen: Still with generalized anasarca ZOX:WRUEA has temporary IJ in place Resp:Unlabored; diminished breath sounds VWU:JWJX BJY:NWGNFAO edema LE; still with cellulitic chages/erythema LE   Basic  Metabolic Panel:  [facility] 08/06/11 0610 08/05/11 0815  NA 138 137  K 4.6 4.5  CL 102 101  CO2 27 27  GLUCOSE 227* 147*  BUN 55* 64*  CREATININE 2.42* 2.49*  CALCIUM 8.9 8.8  MG -- --  PHOS 4.6 4.4   Liver Function Tests:  Basename 08/06/11 0610 08/05/11 0815  AST -- --  ALT -- --  ALKPHOS -- --  BILITOT -- --  PROT -- --  ALBUMIN 2.5* 2.4*   CBC:  Basename 08/06/11 0610 08/05/11 0815  WBC 7.0 8.8  NEUTROABS -- --  HGB 10.4* 9.5*  HCT 33.7* 30.0*  MCV 92.8 91.7  PLT 112* 148*   Anemia Panel:  Basename 08/04/11 0205  VITAMINB12 --  FOLATE --  FERRITIN 470*  TIBC 152*  IRON 25*  RETICCTPCT --     Dg Chest Portable 1 View  08/05/2011  *RADIOLOGY REPORT*  Clinical Data: Shortness of breath, weakness  PORTABLE CHEST - 1 VIEW  Comparison: 08/02/2011  Findings: Bilateral lower lobe opacities, likely atelectasis.  Left mid lung/perihilar opacity, atelectasis versus pneumonia. Pulmonary vascular congestion without frank interstitial edema.  No pneumothorax is seen.  Cardiomegaly. Postsurgical changes related to prior CABG.  Right IJ venous catheter and left subclavian venous catheter, unchanged.  IMPRESSION: Bilateral lower lobe atelectasis.  Left mid lung/perihilar opacity, atelectasis versus pneumonia.  Original Report Authenticated By: Charline Bills, M.D.   Assessment/Plan:  1. AKI on CKD (sepsis, cardiorenal syndrome); remains grossly volume overloaded by exam despite creatinine in the  2's (baseline 2) but numbers just reflect dialysis treatments; needs additonal ultrafiltration. Dialysis dependent and not likely to recover function.  Dialysis again tomorrow  If he decides to stay on dialysis long term, will need AVF or AVG (none of this to me seems to be in keeping with palliative care but he wishes to continue dialysis for now)  Suspect he will like need LTAC as he would need to be able to transfer, sit in a chair for at least 6 hours - currently would not be  appropriate for long term dialysis. I'm not sure how realistic he is about this, or whether he truly understands the implications.   2.Cellulitis right thigh/Klebsiella bacteremia - still quite a bit of edema and redness. On rocephin  3. Anemia started Aranesp and getting IV iron 4. Ischemic cardiomyopathy/chronic systolic heart failure  5. OSA  6. C diff colitis - still has rectal tube/loose stool; completed course of flagyl  7. DNR status   Sienna Stonehocker B

## 2011-08-06 NOTE — Progress Notes (Signed)
Chaplain Note:  Chaplain visited with patient and provided spiritual comfort and support.  Pt was awake, alert, and very receptive of chaplain presence.  Pt discussed a number of issues that fall in the realm of pastoral confidence.  Pt requested that chaplain make a follow up visit during which chaplain will read from the [employer].  Chaplain will follow up on Monday, 08/09/2011.  Verdie Shire, chaplain resident [phone])

## 2011-08-06 NOTE — Progress Notes (Signed)
This Np meet at patient bedside to offer emotional support and offer patient the opportunity to continue discussion rt his values and GOC.  1)  Pt wishes to continue with dialysis at this time.  Dependent on outcomes over the next weeks he will make decision regarding continuing.     -open to disposition to LTAC      - pt remains hopeful for improvement  PMT will continue to support holistically.  Total time spent on the unit was 20 minutes.  Time in 1000 time out 1020  Greater than 50% of the time spent in counseling and coordination of care.  Lorinda Creed NP

## 2011-08-06 NOTE — Progress Notes (Signed)
08/06/2011 [name] SPARKS [phone]       Spoke with Kathlene November from [facility] hospital about LTAC referral. Face sheet (demographics) faxed to [phone]. Will await determination.

## 2011-08-06 NOTE — Progress Notes (Signed)
  Patient was scheduled for removal of temporary dialysis and placement of tunneled dialysis catheter.  The patient became short of breath and decreased oxygen saturations.  Patient was placed on re-breather and saturations improved.  Discussed with Dr. Eliott Nine.  Plan dialysis tonight and hopefully place HD catheter on 08/07/11.

## 2011-08-06 NOTE — Progress Notes (Signed)
Clinical Social Worker was notified by Sports coach that pt accepted by [facility]. Per Case Manager, anticipated D/C for Saturday 08/07/11. Per Case Manager, pt nurse must call report to [facility] before pt transfer to [facility]. Clinical Social Worker notified pt nurse today. Clinical Social Worker to facilitate pt D/C needs when pt medically ready for D/C.   Jacklynn Lewis, MSW, LCSWA  Clinical Social Work [phone]

## 2011-08-06 NOTE — Progress Notes (Signed)
08/06/2011 Palliative Medicine Team SW 4:36 PM  Multiple attempts to see pt for psychosocial support today, pt at IR. Will follow up Monday.   Kennieth Francois, [place]  Pager [phone]

## 2011-08-06 NOTE — Progress Notes (Signed)
Subjective: Short of breath today, once is set up on a recliner. He status post dialysis yesterday.  Objective: Vital signs in last 24 hours: Temp:  [95.2 F (35.1 C)-97.6 F (36.4 C)] 95.2 F (35.1 C) (11/09 0538) Pulse Rate:  [81-116] 116  (11/09 0538) Resp:  [22-24] 22  (11/09 0538) BP: (90-147)/(67-77) 101/77 mmHg (11/09 0538) SpO2:  [92 %-99 %] 94 % (11/09 0538) Weight change:  Body mass index is 35.02 kg/(m^2).  Intake/Output from previous day: 11/08 0701 - 11/09 0700 In: -  Out: 3375 [Urine:225]   PHYSICAL EXAM: Gen Exam: Awake and alert with clear speech.  Clearly dyspneic, better when propped up. Neck: Supple, No JVD.   Chest: B/L has bibasilar rales.  Some scattered rhonchi as well. CVS: S1 S2 Regular, no murmurs.  Abdomen: soft, BS +, non tender, obese. Extremities: + edema, right lateral thigh still erythematous and mildly tender, but unchanged from yesterday. Neurologic: Non Focal.   Skin: No Rash.   Wounds: N/A.    Lab Results:  Basename 08/06/11 0610 08/05/11 0815  WBC 7.0 8.8  HGB 10.4* 9.5*  HCT 33.7* 30.0*  PLT 112* 148*   CMET CMP     Component Value Date/Time   NA 138 08/06/2011 0610   K 4.6 08/06/2011 0610   CL 102 08/06/2011 0610   CO2 27 08/06/2011 0610   GLUCOSE 227* 08/06/2011 0610   BUN 55* 08/06/2011 0610   CREATININE 2.42* 08/06/2011 0610   CALCIUM 8.9 08/06/2011 0610   PROT 5.9* 08/02/2011 0454   ALBUMIN 2.5* 08/06/2011 0610   AST 13 08/02/2011 0454   ALT 11 08/02/2011 0454   ALKPHOS 166* 08/02/2011 0454   BILITOT 0.5 08/02/2011 0454   GFRNONAA 27* 08/06/2011 0610   GFRAA 31* 08/06/2011 0610    LIPIDS @LASTLIPID @ @LASTBNP @ COAGS  Basename 08/06/11 0610  INR 1.48    Studies/Results: Dg Chest Portable 1 View  08/05/2011  *RADIOLOGY REPORT*  Clinical Data: Shortness of breath, weakness  PORTABLE CHEST - 1 VIEW  Comparison: 08/02/2011  Findings: Bilateral lower lobe opacities, likely atelectasis.  Left mid lung/perihilar opacity,  atelectasis versus pneumonia. Pulmonary vascular congestion without frank interstitial edema.  No pneumothorax is seen.  Cardiomegaly. Postsurgical changes related to prior CABG.  Right IJ venous catheter and left subclavian venous catheter, unchanged.  IMPRESSION: Bilateral lower lobe atelectasis.  Left mid lung/perihilar opacity, atelectasis versus pneumonia.  Original Report Authenticated By: Charline Bills, M.D.    Medications:  Scheduled:    . antiseptic oral rinse  15 mL Mouth Rinse QID  . cefTRIAXone (ROCEPHIN) IV  1 g Intravenous Q24H  . darbepoetin (ARANESP) injection - DIALYSIS  200 mcg Intravenous Q Thu-HD  . feeding supplement  237 mL Oral BID  . ferric glucontate (NULECIT) IV  125 mg Intravenous Q T,Th,Sa-HD  . ferric glucontate (FERRLECIT/NULECIT TEST DOSE) 25 mg IV  25 mg Intravenous Once  . heparin subcutaneous  5,000 Units Subcutaneous Q8H  . insulin aspart  0-9 Units Subcutaneous TID WC  . levothyroxine  75 mcg Oral QAC breakfast  . metroNIDAZOLE  500 mg Oral Q8H  . protein supplement  30 mL Oral BID  . saccharomyces boulardii  250 mg Oral BID  . sodium chloride  10 mL Intracatheter Q12H    Assessment/Plan: Active Problems:  1.Severe sepsis: This is stable and appears to be resolving slowly. Continues to be afebrile although slightly tachycardic. This is secondary to Klebsiella Bacteremia and and right thigh abscess which is also  growing Klebsiella. Patient continues to be on Rocephin, will plan atleast a 14 day course. Cdiff Colitis is playing a role as well.   2.Acute renal failure -His urine output for the past 24 hours is only to 225.Marland Kitchen However he still appears volume overloaded, and he is short of breath. -He still wants to continue hemodialysis, nephrology has ordered for hemodialysis catheter placement. Per nephrology note he would benefit from [facility]  Placement -Will touch base with nephrology before formally consulting LTAC.  3. Acute on Chronic Systolic  Heart Failure: Milrinone discontinued by cardiology. BP soft with stable off inotropic support. Tolerating dialysis pretty well yesterday without inotropic support.  4. DIABETES MELLITUS, TYPE II, UNCONTROLLED, WITH COMPLICATIONS: Continue with SSI, this is stable  5. OBSTRUCTIVE SLEEP APNEA: Stable, continue with CPAP as needed at night.  6 CARDIOMYOPATHY, ISCHEMIC Stable at present, diureses with dialysis.   7.DEEP VENOUS THROMBOPHLEBITIS, LEG, RIGHT We'll continue to hold Coumadin, we will reassess  daily and only continue this long-term if patient's long-term prognosis  is good. Start prophylactic heparin.  8. Atrial fibrillation Paroxysmal-Per cards Now off telemetry and in the palliative care unit.  9. Abscess of leg, right Continue with Rocephin, monitor for now, may need re-imaging at some point. Right eye looks unchanged with some mild induration and mild tenderness.   10.Bacteremia due to Gram-negative bacteria Continue with Rocephin.   11. CDiff Colitis: Continue with Flagyl.diarrhea seems to have resolved..   11.Respiratory failure: is S/P VDRF Currently stable  12. Dispo: Remain inpatient, will follow clinical course to see if patient would require hospice home or back to skilled nursing facility with palliative care followup. We'll talk with nephrology to see if they think the patient is appropriate for LTAC placement.  13. Code Status: DNR.    Maretta Bees, MD. 08/06/2011, 11:43 AM

## 2011-08-07 ENCOUNTER — Inpatient Hospital Stay (HOSPITAL_COMMUNITY): Payer: Medicare Other

## 2011-08-07 LAB — GLUCOSE, CAPILLARY
Glucose-Capillary: 161 mg/dL — ABNORMAL HIGH (ref 70–99)
Glucose-Capillary: 136 mg/dL — ABNORMAL HIGH (ref 70–99)

## 2011-08-07 LAB — RENAL FUNCTION PANEL
Albumin: 2.5 g/dL — ABNORMAL LOW (ref 3.5–5.2)
BUN: 38 mg/dL — ABNORMAL HIGH (ref 6–23)
Chloride: 100 mEq/L (ref 96–112)
Chloride: 102 mEq/L (ref 96–112)
Creatinine, Ser: 1.71 mg/dL — ABNORMAL HIGH (ref 0.50–1.35)
GFR calc non Af Amer: 41 mL/min — ABNORMAL LOW (ref >90)
Glucose, Bld: 203 mg/dL — ABNORMAL HIGH (ref 70–99)
Phosphorus: 3.7 mg/dL (ref 2.3–4.6)
Potassium: 4.2 mEq/L (ref 3.5–5.1)
Potassium: 4.2 mEq/L (ref 3.5–5.1)

## 2011-08-07 LAB — CBC
HCT: 34.7 % — ABNORMAL LOW (ref 39.0–52.0)
Hemoglobin: 10.6 g/dL — ABNORMAL LOW (ref 13.0–17.0)
MCHC: 30.5 g/dL (ref 30.0–36.0)

## 2011-08-07 MED ORDER — CEFAZOLIN SODIUM 1-5 GM-% IV SOLN
1.0000 g | Freq: Three times a day (TID) | INTRAVENOUS | Status: DC
  Administered 2011-08-07: 12:00:00 via INTRAVENOUS
  Filled 2011-08-07: qty 50

## 2011-08-07 MED ORDER — DEXTROSE 5 % IV SOLN: 1.0000 g | INTRAVENOUS | Status: AC

## 2011-08-07 MED ORDER — ENSURE CLINICAL ST REVIGOR PO LIQD: 237.0000 mL | Freq: Two times a day (BID) | ORAL | Status: AC

## 2011-08-07 MED ORDER — HEPARIN SODIUM (PORCINE) 5000 UNIT/ML IJ SOLN: 5000.0000 [IU] | Freq: Three times a day (TID) | INTRAMUSCULAR | Status: AC

## 2011-08-07 MED ORDER — ACETAMINOPHEN 325 MG PO TABS: 650.0000 mg | ORAL_TABLET | Freq: Four times a day (QID) | ORAL | Status: AC | PRN

## 2011-08-07 MED ORDER — CEFAZOLIN SODIUM 1-5 GM-% IV SOLN
INTRAVENOUS | Status: AC
  Filled 2011-08-07: qty 50

## 2011-08-07 MED ORDER — MIDAZOLAM HCL 5 MG/5ML IJ SOLN
INTRAMUSCULAR | Status: AC | PRN
  Administered 2011-08-07: 1 mg via INTRAVENOUS

## 2011-08-07 MED ORDER — INSULIN ASPART 100 UNIT/ML ~~LOC~~ SOLN: 0.0000 [IU] | Freq: Three times a day (TID) | SUBCUTANEOUS | Status: AC

## 2011-08-07 MED ORDER — SODIUM CHLORIDE 0.9 % IV SOLN
INTRAVENOUS | Status: AC | PRN
  Administered 2011-08-07: 50 mL/h via INTRAVENOUS

## 2011-08-07 MED ORDER — HEPARIN SODIUM (PORCINE) 1000 UNIT/ML IJ SOLN
INTRAMUSCULAR | Status: AC
  Filled 2011-08-07: qty 1

## 2011-08-07 MED ORDER — MIDAZOLAM HCL 2 MG/2ML IJ SOLN
INTRAMUSCULAR | Status: AC
  Filled 2011-08-07: qty 2

## 2011-08-07 MED ORDER — FENTANYL CITRATE 0.05 MG/ML IJ SOLN
INTRAMUSCULAR | Status: AC
  Filled 2011-08-07: qty 2

## 2011-08-07 MED ORDER — SACCHAROMYCES BOULARDII 250 MG PO CAPS: 250.0000 mg | ORAL_CAPSULE | Freq: Two times a day (BID) | ORAL | Status: AC

## 2011-08-07 MED ORDER — HYDROXYZINE HCL 25 MG PO TABS: 25.0000 mg | ORAL_TABLET | Freq: Three times a day (TID) | ORAL | Status: AC | PRN

## 2011-08-07 MED ORDER — MORPHINE SULFATE 4 MG/ML IJ SOLN: 2.0000 mg | INTRAMUSCULAR | Status: DC | PRN

## 2011-08-07 MED ORDER — DARBEPOETIN ALFA-POLYSORBATE 200 MCG/0.4ML IJ SOLN: 200.0000 ug | INTRAMUSCULAR | Status: AC

## 2011-08-07 NOTE — Discharge Summary (Signed)
Physician Discharge Summary  Patient ID: [name] MRN: [id number] DOB/AGE: 1951/02/11 60 y.o. Primary Care Physician:CAVINESS,DAWN, MD Admit date: 07/15/2011 Discharge date: 08/07/2011    Discharge Diagnoses:   Active Problems:  1.Severe sepsis from Klebsiella bacteremia and right thigh abscess secondary to Klebsiella as well.  2.Acute renal failure with HTN  3.DIABETES MELLITUS, TYPE II, UNCONTROLLED, WITH COMPLICATIONS  4.OBSTRUCTIVE SLEEP APNEA  5.CARDIOMYOPATHY, ISCHEMIC  6.DEEP VENOUS THROMBOPHLEBITIS, LEG, RIGHT  7.Atrial fibrillation  8.Abscess of leg, right  9.Bacteremia due to Gram-negative bacteria  10.Chronic systolic heart failure  11.Adult failure to thrive  12.Respiratory failure-status post of ventilator dependency.   Current Discharge Medication List    START taking these medications   Details  darbepoetin (ARANESP) 200 MCG/0.4ML SOLN Inject 0.4 mLs (200 mcg total) into the vein every Thursday with hemodialysis. Qty: 1.68 mL    dextrose 5 % SOLN 50 mL with cefTRIAXone 1 G SOLR 1 g Inject 1 g into the vein daily.    feeding supplement (ENSURE CLINICAL STRENGTH) LIQD Take 237 mLs by mouth 2 (two) times daily.    heparin 5000 UNIT/ML injection Inject 1 mL (5,000 Units total) into the skin every 8 (eight) hours. Qty: 1 mL    hydrOXYzine (ATARAX/VISTARIL) 25 MG tablet Take 1 tablet (25 mg total) by mouth every 8 (eight) hours as needed for itching. Qty: 30 tablet    insulin aspart (NOVOLOG) 100 UNIT/ML injection Inject 0-9 Units into the skin 3 (three) times daily with meals. Qty: 1 vial, Refills: 0    morphine 4 MG/ML injection Inject 0.5-1 mLs (2-4 mg total) into the vein every hour as needed (prn pain.). Qty: 1 mL    saccharomyces boulardii (FLORASTOR) 250 MG capsule Take 1 capsule (250 mg total) by mouth 2 (two) times daily.      CONTINUE these medications which have CHANGED   Details  acetaminophen (TYLENOL) 325 MG tablet Take 2 tablets (650  mg total) by mouth every 6 (six) hours as needed. Qty: 30 tablet      CONTINUE these medications which have NOT CHANGED   Details  aspirin 81 MG EC tablet Take 81 mg by mouth daily.      levothyroxine (SYNTHROID, LEVOTHROID) 75 MCG tablet Take 75 mcg by mouth every morning.     ranitidine (ZANTAC) 150 MG tablet Take 150 mg by mouth every morning.     sertraline (ZOLOFT) 100 MG tablet Take 100 mg by mouth every morning.     albuterol (VENTOLIN HFA) 108 (90 BASE) MCG/ACT inhaler Inhale 2 puffs into the lungs 4 (four) times daily as needed. As needed for shortness of breath.    docusate sodium (COLACE) 100 MG capsule Take 100 mg by mouth daily.      ipratropium (ATROVENT HFA) 17 MCG/ACT inhaler Inhale 2 puffs into the lungs 4 (four) times daily as needed. As needed for shortness of breath.    oxycodone (OXY-IR) 5 MG capsule Take 5 mg by mouth every 6 (six) hours as needed. As needed for pain.      STOP taking these medications     furosemide (LASIX) 80 MG tablet      spironolactone (ALDACTONE) 25 MG tablet      Tamsulosin HCl (FLOMAX) 0.4 MG CAPS      warfarin (COUMADIN) 4 MG tablet      bisacodyl (DULCOLAX) 10 MG suppository      carvedilol (COREG) 3.125 MG tablet      finasteride (PROSCAR) 5 MG tablet  Insulin Regular Human (HUMULIN R IJ)         Discharged Condition: stable    Consults: Pulmonary critical care medicine [place] [facility] kidney [place] [facility].  Significant Diagnostic Studies: US Renal  07/16/2011  *RADIOLOGY REPORT*  Clinical Data: Renal failure.  RENAL/URINARY TRACT ULTRASOUND COMPLETE  Comparison:  Renal ultrasound 05/04/2011.  Findings:  Right Kidney:  Measures 13.1 cm.  No stone, mass or hydronephrosis. Perihepatic ascites noted.  Left Kidney:  Measures 13.9 cm.  No stone, mass or hydronephrosis.  Bladder:  Foley catheter in place.  IMPRESSION: No acute finding.  Perihepatic ascites.  Original Report Authenticated  By: Bernadene Bell. Maricela Curet, M.D.   Ct Femur Right Wo Contrast  07/26/2011  *RADIOLOGY REPORT*  Clinical Data:  Right leg swelling and pain.  Diabetic patient. Elevated white blood cell count.  CT RIGHT THIGH WITHOUT CONTRAST  Technique:  Multidetector CT imaging of the right thigh was performed according to the standard protocol without intravenous contrast. Multiplanar CT image reconstructions were also generated.  Comparison:  CT scan right thigh 07/19/2011.  Findings:  Extensive infiltration of subcutaneous fat of the right thigh persists.  There is new fluid subjacent to the superficial fascia of the vastus lateralis and rectus femoris measuring up to 7.9 cm AP by 1.3 cm transverse by 20 cm cranial-caudal identified. Focally intense edema is seen superficial to the fascia in this same location.  No intramuscular fluid collection is identified. Fat planes within muscle are preserved.  There is no gas collection. No bony destructive change to suggest osteomyelitis is identified.  There is no fracture.  Partially visualized left thigh demonstrates infiltration of subcutaneous fat.  Again seen is diffuse infiltration of subcutaneous fat of the anterior abdominal wall.  The patient has some free pelvic fluid. Foley catheter is in place.  A rectal tube is also noted.  Imaged intra pelvic contents are otherwise unremarkable.  IMPRESSION:  1.  Infiltration of all visualized subcutaneous fatty structures is compatible with cellulitis and/or anasarca. 2.  New fluid subjacent to the superficial fascia along the lateral aspect of the thigh could be due to more intense edema or possibly abscess formation.  There is no evidence of myositis or osteomyelitis. 3.  Free pelvic fluid.  Original Report Authenticated By: Bernadene Bell. Maricela Curet, M.D.   Ct Femur Right Wo Contrast  07/19/2011  *RADIOLOGY REPORT*  Clinical Data:  Right thigh pain, swelling and erythema.  Question abscess.  Renal failure.  CT OF THE RIGHT THIGH WITHOUT  CONTRAST  Technique:  Multidetector CT imaging of the right thigh was performed according to the standard protocol without intravenous contrast. Multiplanar CT image reconstructions were also generated. Study was performed without intravenous contrast due to renal failure.  Comparison:  Right hip radiographs 09/07/2005.  Findings:  There is diffuse soft tissue stranding throughout the subcutaneous fat of  a lower abdominal panniculus. There is diffuse subcutaneous edema throughout the right thigh.  This appears greatest laterally.  The left thigh is partially imaged, and the findings on the right appear asymmetric.  No focal fluid collection is identified.  No appreciable muscular enlargement or low density is identified. There are diffuse vascular calcifications.  The right femur appears normal without evidence of acute fracture or bone destruction.  Foley catheter is in place.  There is an oval 3.4 cm low density structure anteriorly in the scrotum which could reflect a focal hydrocele or cyst.  This is suboptimally evaluated by CT.  IMPRESSION:  1.  Diffuse right thigh subcutaneous edema compatible with cellulitis.  No focal fluid collection identified. 2.  Possible associated panniculitis in the lower anterior abdominal wall.  Correlate clinically. 3.  No acute or significant osseous findings. 4.  Possible focal hydrocele or cyst within the right scrotum. Correlate clinically.  Original Report Authenticated By: Gerrianne Scale, M.D.   Ct Aspiration  07/28/2011  *RADIOLOGY REPORT*  Clinical Data: Right foot cellulitis.  Fluid subjacent to the superficial fascia in the lateral thigh.  CT GUIDANCE NEEDLE PLACEMENT  Comparison: CT 07/26/2011  Technique and findings: The procedure, risks (including but not limited to bleeding, infection, organ damage), benefits, and alternatives were explained to the patient.  Questions regarding the procedure were encouraged and answered.  The patient understands and consents  to the procedure.Select axial scans through the thigh were obtained and an appropriate skin entry site was identified.  Site was marked, prepped with Betadine, draped in usual sterile fashion, infiltrated locally with 1% lidocaine.  An 18 gauge needle was advanced into the fluid collection.  Its position was confirmed on CT.  Approximate 3 ml of thin purulent appearing material were aspirated, sent for Gram stain, culture and sensitivity.  The patient tolerated the procedure well.  No immediate complication.  IMPRESSION:  1.  Technically successful CT-guided aspiration of the right thigh deep fluid.  The fluid is incompletely aspirated, suggesting significant loculation.  Original Report Authenticated By: Osa Craver, M.D.   Dg Chest Portable 1 View  08/05/2011  *RADIOLOGY REPORT*  Clinical Data: Shortness of breath, weakness  PORTABLE CHEST - 1 VIEW  Comparison: 08/02/2011  Findings: Bilateral lower lobe opacities, likely atelectasis.  Left mid lung/perihilar opacity, atelectasis versus pneumonia. Pulmonary vascular congestion without frank interstitial edema.  No pneumothorax is seen.  Cardiomegaly. Postsurgical changes related to prior CABG.  Right IJ venous catheter and left subclavian venous catheter, unchanged.  IMPRESSION: Bilateral lower lobe atelectasis.  Left mid lung/perihilar opacity, atelectasis versus pneumonia.  Original Report Authenticated By: Charline Bills, M.D.   Dg Chest Portable 1 View  08/02/2011  *RADIOLOGY REPORT*  Clinical Data: Congestive heart failure and shortness of breath  PORTABLE CHEST - 1 VIEW  Comparison: 07/30/2011  Findings:  There is a left arm PICC line with tip in the SVC.  Right IJ catheter tip is in the SVC.  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  Heart size is enlarged.  Interval increase in pleural effusions with decreased aeration to both lung bases.  IMPRESSION:  1.  Increase in bilateral pleural  effusions and associated decreased aeration the lung bases  Original Report Authenticated By: Rosealee Albee, M.D.   Dg Chest Portable 1 View  07/30/2011  *RADIOLOGY REPORT*  Clinical Data: 60 year old male with shortness of breath.  PORTABLE CHEST - 1 VIEW  Comparison: 07/27/2011 and earlier.  Findings: AP portable semi upright view at 0541 hours.  The patient is extubated.  Enteric tube is removed.  Right IJ central line and left subclavian central line remain.  Stable lung volumes. Stable cardiomegaly and mediastinal contours.  Linear and streaky perihilar opacity.  No pneumothorax or edema.  No large effusion.  IMPRESSION: 1.  Extubated.  Enteric tube removed. 2.  Stable lung volumes with perihilar atelectasis.  Original Report Authenticated By: Harley Hallmark, M.D.   Dg Chest Portable 1 View  07/27/2011  *RADIOLOGY REPORT*  Clinical Data: Assess endotracheal tube.  PORTABLE CHEST - 1 VIEW  Comparison: 07/26/2011  Findings: Endotracheal tube is 3.2 cm above the carina. Nasogastric tube extends into the abdomen.  Right jugular central venous catheter in the SVC region.  Left subclavian central line in the lower SVC.  There are persistent basilar densities suggesting a combination of atelectasis and pleural fluid.  The heart is enlarged for size.  Evidence of a CABG procedure.  IMPRESSION: Persistent basilar densities suggesting a combination of atelectasis and possibly pleural fluid.  Cardiomegaly.  Original Report Authenticated By: Richarda Overlie, M.D.   Dg Chest Portable 1 View  07/26/2011  *RADIOLOGY REPORT*  Clinical Data: Respiratory distress.  Intubated patient.  PORTABLE CHEST - 1 VIEW  Comparison: Chest 07/25/2011 and 07/24/2011.  Findings: Support tubes and lines are unchanged.  Cardiomegaly is again seen. The patient has small bilateral pleural effusions and basilar airspace disease, greater on the left.  Left basilar aeration shows some interval worsening.  No pneumothorax is identified.   IMPRESSION: Interval worsening in left basilar airspace disease.  Otherwise unchanged.  Original Report Authenticated By: Bernadene Bell. D'ALESSIO, M.D.   Dg Chest Portable 1 View  07/25/2011  *RADIOLOGY REPORT*  Clinical Data: Respiratory distress  PORTABLE CHEST - 1 VIEW  Comparison: 07/24/2011  Findings: Cardiomegaly again noted.  Stable NG tube and endotracheal tube position. Stable right IJ central line position. Central mild vascular congestion without convincing edema.  Slight improvement in aeration.  Residual bilateral basilar atelectasis or infiltrate.  IMPRESSION:  Stable NG tube and endotracheal tube position. Stable right IJ central line position.  Central mild vascular congestion without convincing edema.  Slight improvement in aeration.  Residual bilateral basilar atelectasis or infiltrate.  Original Report Authenticated By: Natasha Mead, M.D.   Dg Chest Portable 1 View  07/24/2011  *RADIOLOGY REPORT*  Clinical Data: Pleural edema  PORTABLE CHEST - 1 VIEW  Comparison:   the previous day's study  Findings: Endotracheal tube, nasogastric tube, right IJ central line, and left arm central catheter are stable in position. Previous CABG.  Stable cardiomegaly.  Low lung volumes with patchy bibasilar atelectasis or consolidation, slightly increased since previous exam.  No definite effusion.  IMPRESSION:  1.  Low volumes with some worsening of bibasilar consolidation or atelectasis. 2. Support hardware stable in position.  Original Report Authenticated By: Osa Craver, M.D.   Dg Chest Portable 1 View  07/23/2011  *RADIOLOGY REPORT*  Clinical Data: ETT insertion  PORTABLE CHEST - 1 VIEW  Comparison: 07/23/2011  Findings: Endotracheal tube terminates 5 cm above the carina. Enteric tube courses below the diaphragm.  Stable right IJ and left subclavian venous catheters.  Cardiomegaly with mild interstitial edema.  Bilateral lower lobe opacities, likely a combination of atelectasis and pleural  effusions.  Postsurgical changes related to prior CABG.  IMPRESSION: Endotracheal tube terminates 5 cm above the carina.  Cardiomegaly with mild interstitial edema.  Bilateral lower lobe opacities, likely a combination of atelectasis and effusion.  Original Report Authenticated By: Charline Bills, M.D.   Dg Chest Portable 1 View  07/23/2011  *RADIOLOGY REPORT*  Clinical Data: Pulmonary edema.  Evaluate endotracheal tube position.  PORTABLE CHEST - 1 VIEW  Comparison: 07/22/2011 and 07/21/2011.  Findings: 0556 hours. Left arm PICC and right IJ central venous catheter are unchanged in position.  There is stable cardiomegaly with pulmonary edema and bilateral pleural effusions.  No pneumothorax is demonstrated.  There is no visible endotracheal tube.  IMPRESSION: Stable examination with persistent mild edema and bilateral pleural effusions.  Remaining support system is unchanged.  Original Report Authenticated By: Gerrianne Scale, M.D.   Dg Chest Portable 1 View  07/22/2011  *RADIOLOGY REPORT*  Clinical Data: Respiratory distress  PORTABLE CHEST - 1 VIEW  Comparison: 07/21/2011  Findings: Increase in diffuse bilateral airspace disease suggestive of pulmonary edema.  There is vascular congestion and bilateral pleural effusions.  Bibasilar atelectasis is slightly increased.  Endotracheal tube has been removed.  Right jugular catheter tip in the SVC.  Left subclavian catheter in the lower SVC.  IMPRESSION: Worsening congestive heart failure with pulmonary edema.  Slight increase in bilateral effusion and bibasilar atelectasis.  Original Report Authenticated By: Camelia Phenes, M.D.   Dg Chest Portable 1 View  07/21/2011  *RADIOLOGY REPORT*  Clinical Data: Assess ETT  PORTABLE CHEST - 1 VIEW  Comparison: 07/20/2011  Findings: Cardiomegaly with mild interstitial edema.  Right lower lobe opacity, likely a combination of atelectasis and effusion. Suspected small left pleural effusion.  No pneumothorax.   Postsurgical changes related to prior CABG.  Endotracheal tube terminates 6 cm above the carina.  Stable right IJ and left subclavian venous catheters.  Enteric tube courses below the diaphragm.  IMPRESSION: Endotracheal tube terminates 6 cm above the carina.  Cardiomegaly with mild interstitial edema and bilateral pleural effusions.  Original Report Authenticated By: Charline Bills, M.D.   Dg Chest Portable 1 View  07/20/2011  *RADIOLOGY REPORT*  Clinical Data: History of pneumonia and respiratory distress.  PORTABLE CHEST - 1 VIEW  Comparison: 07/19/2011.  Findings: Tip of endotracheal tube terminates 3.5 cm above the carina.  Tip of left sided venous catheter terminates in the lower portion of the superior vena cava.  Tip of right internal jugular venous catheter terminates in the midportion of the superior vena cava.  Enteric tube is in place with its tip in the area of the distal esophagus.  This could be advanced into the stomach.  There is continued enlargement of the cardiac silhouette unchanged. There are low lung volumes.  There is vascular congestion pattern. Atelectasis and infiltrative densities are seen within the right mid and lower lung.  There is subsegmental atelectasis in the left midlung zone with some hazy atelectasis in left base.  There is indistinctness of the right costophrenic angle most likely reflecting right pleural effusion unchanged.  There is continued opacity in the inferior left hemithorax with loss of definition of the margin of the left hemidiaphragm.  IMPRESSION: Position of tubes and catheters are described above.  The enteric tube is in the area of the distal esophagus.  It could be advanced into the stomach if clinically indicated.  There is continued enlargement of the cardiac silhouette unchanged.  There are low lung volumes.  There is vascular congestion pattern. Atelectasis and infiltrative densities are seen within the right mid and lower lung.  There is  subsegmental atelectasis in the left midlung zone with some hazy atelectasis in left base.  There is indistinctness of the right costophrenic angle most likely reflecting right pleural effusion unchanged.  There is continued opacity in the inferior left hemithorax with loss of definition of the margin of the left hemidiaphragm. No significant change from prior study.  Original Report Authenticated By: Crawford Givens, M.D.   Dg Chest Portable 1 View  07/19/2011  *RADIOLOGY REPORT*  Clinical Data: Patient on ventilator.  Line and tube placement. Shortness of breath.  PORTABLE CHEST - 1 VIEW  Comparison: 07/18/2011  Findings: Endotracheal tube has been placed, tip approximately 4 x 3 cm above carina.  Right IJ central line tip overlies the superior vena cava.  A second right IJ central line has been placed, parallel to the first, tip also in the superior vena cava. A left subclavian central line tip overlies the level of the superior vena cava.  Nasogastric tube is in place, side port in the region of the lower esophagus, tip to the level of the distal esophagus.  No evidence for pneumothorax.  The heart is enlarged.  There are bibasilar opacities, obscuring hemidiaphragms, right greater than left.  There are is a right pleural effusion, increased or more apparent.  IMPRESSION:  1.  Interval placement endotracheal tube and second right IJ central line. 2.  Interval placement of left subclavian central line. 3.  Increased right pleural effusion. 4.  Nasogastric tube to the distal esophagus.  Original Report Authenticated By: Patterson Hammersmith, M.D.   Dg Chest Portable 1 View  07/18/2011  *RADIOLOGY REPORT*  Clinical Data: Central line placement.  PORTABLE CHEST - 1 VIEW  Comparison: Chest 07/18/2011.  Findings: The patient has a new right IJ approach catheter with the tip in the lower superior vena cava.  No pneumothorax.  Marked cardiomegaly is again seen.  Pulmonary edema shows marked improvement.  Basilar  atelectasis noted.  IMPRESSION:  1.  Right IJ catheter in good position.  No pneumothorax. 2.  Marked improvement in pulmonary edema.  Original Report Authenticated By: Bernadene Bell. D'ALESSIO, M.D.   Dg Chest Portable 1 View  07/18/2011  *RADIOLOGY REPORT*  Clinical Data: Follow-up CHF and shortness of breath.  PORTABLE CHEST - 1 VIEW  Comparison: 07/15/2011  Findings: Enlargement of the cardiac silhouette.  Increased densities in both lungs suggest airspace disease and suspect pleural fluid, right side greater than left.  No evidence for a large pneumothorax.  IMPRESSION: Cardiomegaly with evidence for pulmonary edema and pleural effusions.  Findings are most compatible with CHF.  Original Report Authenticated By: Richarda Overlie, M.D.   Dg Pneumonia Chest 2v  07/15/2011  *RADIOLOGY REPORT*  Clinical Data: Shortness of breath, weakness, acute bronchitis  CHEST - 2 VIEW  Comparison: 06/24/2011  Findings: Previous right PICC line removed.  Coronary bypass changes noted.  Heart is enlarged with central vascular congestion. Band-like left midlung and bibasilar atelectasis noted, worse in the left lower lobe.  Small residual left effusion.  Upper lungs remain clear.  No pneumothorax.  Trachea is midline.  IMPRESSION: Cardiomegaly with vascular congestion, left mid lung and bibasilar atelectasis.  Residual smaller left effusion.  Original Report Authenticated By: Judie Petit. Ruel Favors, M.D.    Lab Results: Results for orders placed during the hospital encounter of 07/15/11 (from the past 48 hour(s))  GLUCOSE, CAPILLARY     Status: Abnormal   Collection Time   08/05/11 12:49 PM      Component Value Range Comment   Glucose-Capillary 187 (*) 70 - 99 (mg/dL)    Comment 1 Documented in Chart      Comment 2 Notify RN     GLUCOSE, CAPILLARY     Status: Abnormal   Collection Time   08/05/11  9:10 PM      Component Value Range Comment   Glucose-Capillary 142 (*) 70 - 99 (mg/dL)    Comment 1 Notify RN     RENAL FUNCTION  PANEL     Status: Abnormal   Collection Time   08/06/11  6:10 AM      Component Value Range Comment   Sodium 138  135 - 145 (mEq/L)  Potassium 4.6  3.5 - 5.1 (mEq/L)    Chloride 102  96 - 112 (mEq/L)    CO2 27  19 - 32 (mEq/L)    Glucose, Bld 227 (*) 70 - 99 (mg/dL)    BUN 55 (*) 6 - 23 (mg/dL)    Creatinine, Ser 5.62 (*) 0.50 - 1.35 (mg/dL)    Calcium 8.9  8.4 - 10.5 (mg/dL)    Phosphorus 4.6  2.3 - 4.6 (mg/dL)    Albumin 2.5 (*) 3.5 - 5.2 (g/dL)    GFR calc non Af Amer 27 (*) >90 (mL/min)    GFR calc Af Amer 31 (*) >90 (mL/min)   CBC     Status: Abnormal   Collection Time   08/06/11  6:10 AM      Component Value Range Comment   WBC 7.0  4.0 - 10.5 (K/uL)    RBC 3.63 (*) 4.22 - 5.81 (MIL/uL)    Hemoglobin 10.4 (*) 13.0 - 17.0 (g/dL)    HCT 13.0 (*) 86.5 - 52.0 (%)    MCV 92.8  78.0 - 100.0 (fL)    MCH 28.7  26.0 - 34.0 (pg)    MCHC 30.9  30.0 - 36.0 (g/dL)    RDW 78.4 (*) 69.6 - 15.5 (%)    Platelets 112 (*) 150 - 400 (K/uL)   PROTIME-INR     Status: Abnormal   Collection Time   08/06/11  6:10 AM      Component Value Range Comment   Prothrombin Time 18.2 (*) 11.6 - 15.2 (seconds)    INR 1.48  0.00 - 1.49    APTT     Status: Normal   Collection Time   08/06/11  6:10 AM      Component Value Range Comment   aPTT 36  24 - 37 (seconds)   GLUCOSE, CAPILLARY     Status: Abnormal   Collection Time   08/06/11  8:16 AM      Component Value Range Comment   Glucose-Capillary 173 (*) 70 - 99 (mg/dL)    Comment 1 Documented in Chart      Comment 2 Notify RN     GLUCOSE, CAPILLARY     Status: Abnormal   Collection Time   08/06/11 12:48 PM      Component Value Range Comment   Glucose-Capillary 151 (*) 70 - 99 (mg/dL)    Comment 1 Notify RN      Comment 2 Documented in Chart     RENAL FUNCTION PANEL     Status: Abnormal   Collection Time   08/07/11  5:45 AM      Component Value Range Comment   Sodium 138  135 - 145 (mEq/L)    Potassium 4.2  3.5 - 5.1 (mEq/L)    Chloride 100   96 - 112 (mEq/L)    CO2 29  19 - 32 (mEq/L)    Glucose, Bld 210 (*) 70 - 99 (mg/dL)    BUN 32 (*) 6 - 23 (mg/dL) DELTA CHECK NOTED   Creatinine, Ser 1.71 (*) 0.50 - 1.35 (mg/dL)    Calcium 8.8  8.4 - 10.5 (mg/dL)    Phosphorus 3.7  2.3 - 4.6 (mg/dL)    Albumin 2.5 (*) 3.5 - 5.2 (g/dL)    GFR calc non Af Amer 41 (*) >90 (mL/min)    GFR calc Af Amer 48 (*) >90 (mL/min)   APTT     Status: Normal   Collection Time   08/07/11  5:45 AM      Component Value Range Comment   aPTT 37  24 - 37 (seconds)   GLUCOSE, CAPILLARY     Status: Abnormal   Collection Time   08/07/11  8:56 AM      Component Value Range Comment   Glucose-Capillary 161 (*) 70 - 99 (mg/dL)    Recent Results (from the past 240 hour(s))  CULTURE, ROUTINE-ABSCESS     Status: Normal   Collection Time   07/28/11  1:00 PM      Component Value Range Status Comment   Specimen Description ABSCESS RIGHT THIGH   Final    Special Requests RT LATERAL THIGH PT ON ROCEPHIN FLAGYL   Final    Gram Stain     Final    Value: ABUNDANT WBC PRESENT, PREDOMINANTLY PMN     NO SQUAMOUS EPITHELIAL CELLS SEEN     NO ORGANISMS SEEN   Culture FEW KLEBSIELLA OXYTOCA   Final    Report Status 07/31/2011 FINAL   Final    Organism ID, Bacteria KLEBSIELLA OXYTOCA   Final      Hospital Course:   1. Severe sepsis -This is secondary to Klebsiella bacteremia, right thigh abscess (Klebsiella), C. difficile colitis and possible aspiration ammonia as well. -Patient has had a prolonged hospital course, he has had a long stay in the intensive care unit where he was ventilator dependent and was actually intubated twice. -In regards to his Klebsiella bacteremia, he is maintained on IV Rocephin. He would need another 2 more weeks of IV Rocephin therapy. -In regards to his right thigh abscess-patient is status post CT-guided aspiration, cultures were positive for Klebsiella again, which is sensitive to Rocephin. On exam his right thigh area has significantly  less erythema and tenderness however it is slightly slightly still indurated.It is recommended that perhaps in the next week or so a repeat imaging be done of this area. Please note, the patient is evaluated by [facility] surgery for a possible I&D, however he was deemed a high risk candidate, patient's sister decided against proceeding with surgery and hence interventional radiology was consulted for CT-guided aspiration. -In regards to C. difficile, this is significantly better his rectal tube has been removed. Diarrhea has almost resolved. Patient has completed a course of Flagyl. He will be continued to be maintained on FLORASTOR. -Overall, patient has been persistently afebrile and his leukocytosis has resolved.  2. Ventilator dependent respiratory failure -This is secondary to pulmonary aspiration, and also pulmonary edema from CHF and acute renal failure.  -Patient was intubated twice during this admission, he was last extubated on 10/30, his respiratory status has been stable since then. -Patient has chronic systolic heart failure, he is still in acute renal failure and requires dialysis for diuresis. -Patient has chronic shortness of breath, his shortness of breath is significantly better when he is in a propped up position even at rest. -After a palliative care consultation and subsequent discussion with family and patient, patient is now a DO NOT RESUSCITATE.  3. Acute renal failure with acute tubular necrosis, in the setting of severe sepsis. -Unfortunately patient's hospital course was complicated by acute renal failure with ATN, patient still continues to be oliguric. -He is grossly volume overloaded still. Initially he was barely tolerating dialysis due to tachycardia brief periods of hypotension. However for the past few days he has tolerated hemodialysis well, even off milrinone infusion. -He is currently awaiting a hemodialysis catheter placement by interventional  radiology. -Patient is  keen on pursuing hemodialysis for the next month or so, he claims he does not know yet whether he would proceed with long-term dialysis. -He would need continuation of dialysis when he is at [facility]. His last hemodialysis was last night, his urine output for the past 24 hours was only 200 cc.  4. Acute systolic heart failure -During his hospital stay, with ventilator dependency and worsening renal failure patient did require inotropic support with milrinone. This is now been discontinued. He is getting dialysis for diuresis. His blood pressure will not at this time tolerate adding beta blockers or ACE inhibitors. -Hopefully as his clinical situation improves these medications can be added at that point in time. -Patient as an outpatient, follows with [facility]. On discharge from LTAC, patient should follow up with his primary cardiologist.  5. Ischemic cardiomyopathy -Patient has a known history of chronic systolic heart failure with an EF around 20-30%, he also had a recent history of CABG x4 at [facility] on June 31 2012. -He is to continue on aspirin.  6. 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 Coumadin continues to be held, as the patient is scheduled for a hemodialysis catheter placement today. -Currently patient is being maintained on heparin at prophylactic dosing, Coumadin will need to be resumed in the next few days, if the patient does not require any further procedures down the road.  7. Atrial fibrillation -This is currently stable. However he continues to have paroxysmal of high rate. Currently he has not required any rate control agents, as his blood pressure would not  tolerate. However if this continues to be more of an issue, he may need initiation of amiodarone. -As noted above anticoagulation  will need to be started at some point in time, when no further procedures are planned. Currently we are awaiting a hemodialysis catheter placement before he is discharged today.  8. Obstructive sleep apnea -Patient uses CPAP at night. This is stable.  9. Palliative care -Patient has had extensive goals of care discussion with our palliative care team. -He has elected to be a DO NOT RESUSCITATE. -He however at this point wishes to continue dialysis for the immediate future, at some point further goals of care discussion will need to be done in the next few weeks depending on patient's clinical progress.    Discharge Exam: Blood pressure 93/63, pulse 98, temperature 97.4 F (36.3 C), temperature source Oral, resp. rate 22, height 5' 9.49" (1.765 m), weight 109.1 kg (240 lb 8.4 oz), SpO2 96.00%.  GEN exam-awake alert speech clear, appears slightly less dyspneic than yesterday. Chest-good air entry except at bases but otherwise clear. Cardiovascular-heart sounds are regular Abdomen-soft slightly distended but nontender. Extremities-2+ pitting edema in the lower extremities. Neurology-awake alert, generalized weakness, but nonfocal.  Disposition:  Current disposition is transfer to [facility].  Discharge Orders    Future Orders Please Complete By Expires   Diet - low sodium heart healthy      Diet Carb Modified      Increase activity slowly      Continue foley catheter      Comments:   Continue foley catheter at discharge      Follow-up Information    Follow up with CAVINESS,DAWN. (1 week after discharge from [facility])    Contact information:   [address] [place] [address] [phone]       Follow up with [name], MD in 1 week. (Followup in one week after discharge from LTAC)    Contact information:   [address], [address] [place] [address] [phone]           Signed: Jeoffrey Massed 08/07/2011, 10:48 AM

## 2011-08-07 NOTE — Procedures (Signed)
Successful placement of R IJ approach HD catheter with tips in RA.  No immediate complications.   Successful removal of existing temporary HD catheter.

## 2011-08-07 NOTE — Progress Notes (Signed)
Placed patient on cpap. Pt is tolerating cpap well at this time. Rt will continue to monitor.

## 2011-08-07 NOTE — Progress Notes (Signed)
Subjective: Interval History: could not get permacath yesterday due to inability to lie flat; was therefore dialyzed last evening with a total of 4 liters off; he is presently downstairs getting his permacath so will have to come back and see after he returns (tentative plans were for dialysis again today but I am told he is to be transferred to [facility] today) Objective:    . antiseptic oral rinse  15 mL Mouth Rinse QID  . cefTRIAXone (ROCEPHIN) IV  1 g Intravenous Q24H  . darbepoetin (ARANESP) injection - DIALYSIS  200 mcg Intravenous Q Thu-HD  . feeding supplement  237 mL Oral BID  . fentaNYL      . ferric glucontate (NULECIT) IV  125 mg Intravenous Q T,Th,Sa-HD  . heparin      . heparin subcutaneous  5,000 Units Subcutaneous Q8H  . insulin aspart  0-9 Units Subcutaneous TID WC  . levothyroxine  75 mcg Oral QAC breakfast  . midazolam      . protein supplement  30 mL Oral BID  . saccharomyces boulardii  250 mg Oral BID  . sodium chloride  10 mL Intracatheter Q12H   Vital signs in last 24 hours:  Temp:  [96 F (35.6 C)-97.6 F (36.4 C)] 97.4 F (36.3 C) (11/10 0500) Pulse Rate:  [93-104] 98  (11/10 0500) Resp:  [22-24] 22  (11/10 0500) BP: (86-107)/(35-75) 93/63 mmHg (11/10 0500) SpO2:  [96 %-99 %] 96 % (11/10 0500)  Weight change:   Intake/Output: I/O last 3 completed shifts: In: 120 [I.V.:120] Out: 4426 [Urine:425; Other:4000; Stool:1]   Intake/Output this shift:  Total I/O In: -  Out: 125 [Urine:125]  Lab Results:  [facility] 08/06/11 0610 08/05/11 0815  WBC 7.0 8.8  HGB 10.4* 9.5*  HCT 33.7* 30.0*  PLT 112* 148*   BMET  Basename 08/07/11 0545 08/06/11 0610 08/05/11 0815  NA 138 138 137  K 4.2 4.6 4.5  CL 100 102 101  CO2 29 27 27   GLUCOSE 210* 227* 147*  BUN 32* 55* 64*  CREATININE 1.71* 2.42* 2.49*  CALCIUM 8.8 8.9 8.8  PHOS 3.7 4.6 4.4   LFT  Basename 08/07/11 0545  PROT --  ALBUMIN 2.5*  AST --  ALT --  ALKPHOS --  BILITOT --    BILIDIR --  IBILI --   PT/INR  Basename 08/06/11 0610  LABPROT 18.2*  INR 1.48   Hepatitis Panel No results found for this basename: HEPBSAG,HCVAB,HEPAIGM,HEPBIGM in the last 72 hours  Studies/Results: No results found.  Assessment/Plan:  ESRD-dialysis dependent with significant volume overload; nowhere close to a dry weight; if he goes to [facility] they will need to aggressively dialyze there to lower weight; creatinines are low but he is not diuretic responsive and has significant cardiorenal component to his renal issues.  He has been on a Tuesday-Thursday-Saturday dialysis regimen (with the extra treatment yesterday for symptomatic volume overload)  If he decides he wishes to remain on dialysis and gets to a point of being able to be discharged from [facility] he will require a permanent access in place on order to be accepted.  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  Duration of therapy per  primary service.     LOS: 23 Sammie Schermerhorn B @TODAY @11 :20 AM

## 2011-08-07 NOTE — Progress Notes (Signed)
PT still does not want to go on cpap at this time. RT will continue to monitor.

## 2011-08-07 NOTE — Progress Notes (Signed)
RENAL See AM note for additional details. Patient is in dialysis. I was present at this session.  I have reviewed the session itself and made appropriate changes. Pt has new access - right IJ permacath.  C/o cough, some SOB.  On oxygen.  Goal is for 4 liters off if BP allows.  Weights inaccurate.    [name] B 11/10/20124:16 PM

## 2011-08-07 NOTE — Progress Notes (Signed)
SUBJECTIVE:  Weak but no distress.  Mild cough.  Breathing OK   PHYSICAL EXAM Filed Vitals:   08/06/11 2100 08/06/11 2130 08/06/11 2155 08/07/11 0500  BP: 105/75 97/61 91/65  93/63  Pulse: 101 100 96 98  Temp:   96 F (35.6 C) 97.4 F (36.3 C)  TempSrc:   Axillary Oral  Resp:   24 22  Height:      Weight:      SpO2:   98% 96%   General:  Frail Lungs:  Decreased BS, no wheezing Heart:  Irregular  Abdomen:  Dependent edema, Positive bowel sounds, no rebound no guarding Extremities:  Decreased edema much much improved.  LABS:  Results for orders placed during the hospital encounter of 07/15/11 (from the past 24 hour(s))  GLUCOSE, CAPILLARY     Status: Abnormal   Collection Time   08/06/11 12:48 PM      Component Value Range   Glucose-Capillary 151 (*) 70 - 99 (mg/dL)   Comment 1 Notify RN     Comment 2 Documented in Chart    RENAL FUNCTION PANEL     Status: Abnormal   Collection Time   08/07/11  5:45 AM      Component Value Range   Sodium 138  135 - 145 (mEq/L)   Potassium 4.2  3.5 - 5.1 (mEq/L)   Chloride 100  96 - 112 (mEq/L)   CO2 29  19 - 32 (mEq/L)   Glucose, Bld 210 (*) 70 - 99 (mg/dL)   BUN 32 (*) 6 - 23 (mg/dL)   Creatinine, Ser 6.04 (*) 0.50 - 1.35 (mg/dL)   Calcium 8.8  8.4 - 54.0 (mg/dL)   Phosphorus 3.7  2.3 - 4.6 (mg/dL)   Albumin 2.5 (*) 3.5 - 5.2 (g/dL)   GFR calc non Af Amer 41 (*) >90 (mL/min)   GFR calc Af Amer 48 (*) >90 (mL/min)  APTT     Status: Normal   Collection Time   08/07/11  5:45 AM      Component Value Range   aPTT 37  24 - 37 (seconds)    Intake/Output Summary (Last 24 hours) at 08/07/11 1008 Last data filed at 08/07/11 0500  Gross per 24 hour  Intake    120 ml  Output   4201 ml  Net  -4081 ml   ASSESSMENT AND PLAN:  Active Problems:  DIABETES MELLITUS, TYPE II, UNCONTROLLED, WITH COMPLICATIONS  OBSTRUCTIVE SLEEP APNEA  CARDIOMYOPATHY, ISCHEMIC  Chronic systolic heart failure  DEEP VENOUS THROMBOPHLEBITIS, LEG, RIGHT  Atrial fibrillation  Severe sepsis  Acute renal failure  Adult failure to thrive  Abscess of leg, right  Bacteremia due to Gram-negative bacteria  Respiratory failure   CHF:  Currently he is not on anything for the CHF as BP has not tolerate this.  Perhaps slowly over time they will be able to start low dose betablocker (Coreg) short acting to be held with dialysis.  This would be for rate control and CHF  Atrial fib:  Paroxysms of this with occasional rapid rate.  Could be on very low dose dig with levels or amiodarone if he has sustained tachy rates.  Restart coumadin in time if no invasive procedures palnned   [name] 08/07/2011 10:08 AM

## 2011-08-07 NOTE — Progress Notes (Signed)
PT did not want to go on CPAP at this time. York Spaniel it was too early for him to go to sleep but he may wear it later on. RT will check on PT later and also RT advised PT to call if he does get ready to wear it sooner. RT will continue to monitor PT.

## 2011-08-08 LAB — GLUCOSE, CAPILLARY: Glucose-Capillary: 191 mg/dL — ABNORMAL HIGH (ref 70–99)

## 2011-08-08 LAB — APTT: aPTT: 35 seconds (ref 24–37)

## 2011-08-08 NOTE — Progress Notes (Signed)
On 08/07/11 CSW faxed d/c summary to LTAC, for anticipated d/c on 08/07/11. Milus Banister MSW,LCSW w/e Coverage [phone]

## 2011-08-08 NOTE — Progress Notes (Signed)
PT called RT at this time and wanted to go on cpap. RT placed pt on cpap at this time starting out at a pressure of 5 with 5lpm o2 bleed in. RT will continue to monitor pt.

## 2011-08-28 DEATH — deceased

## 2011-09-03 ENCOUNTER — Other Ambulatory Visit: Payer: Self-pay

## 2011-09-14 IMAGING — CT CT CHEST W/O CM
2 of 3 series · 15 of 36 positions shown, 18 images · non-contrast
Comparison: Chest radiographs today.  Chest CT 05/21/2010.

CLINICAL DATA: Recent CABG.  Severe shortness of breath.  History
of congestive heart failure, atrial fibrillation, renal failure,
diabetes, COPD, myocardial infarction and pulmonary edema.

CT CHEST WITHOUT CONTRAST
TECHNIQUE: Multidetector CT imaging of the chest was performed
following the standard protocol without IV contrast.

[Series 3: chest without · axial · non-contrast · 0.85mm/px · z∈[-310,-20]mm · 12 of 70 slices shown, 15 images]
[im 6/70  mediastinal]
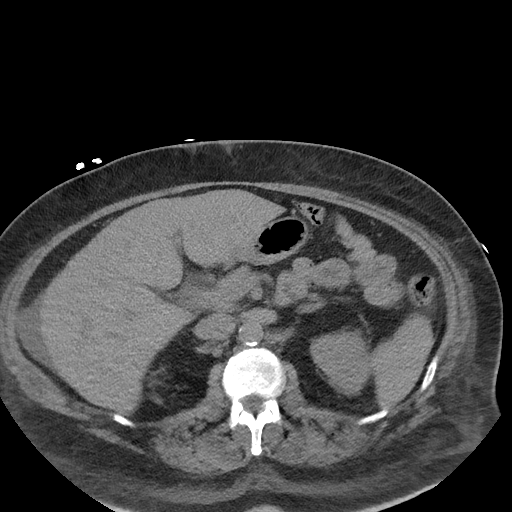
[im 6/70  lung]
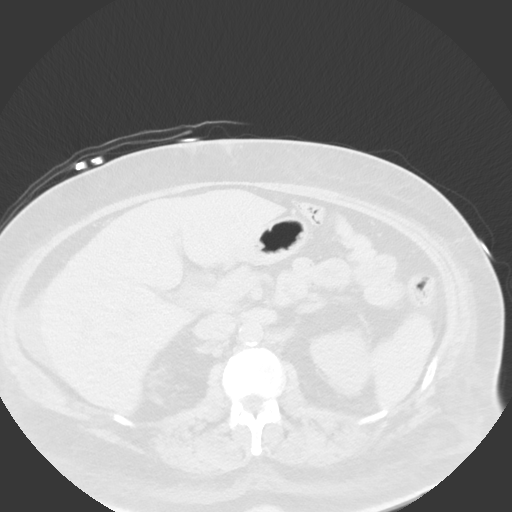
[im 11/70  lung]
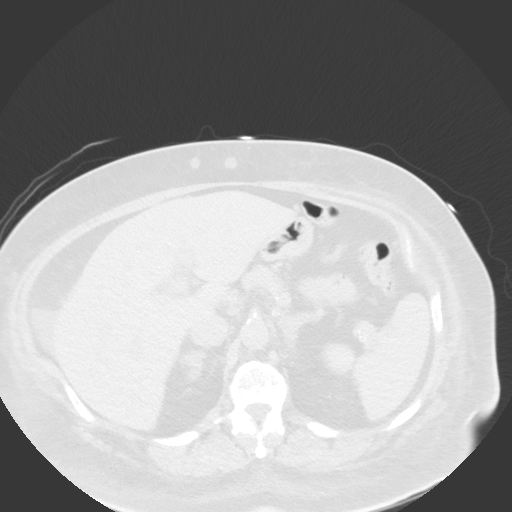
[im 16/70  lung]
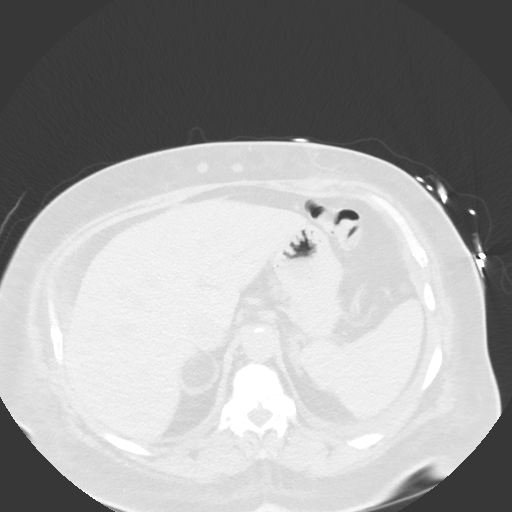
[im 21/70  lung]
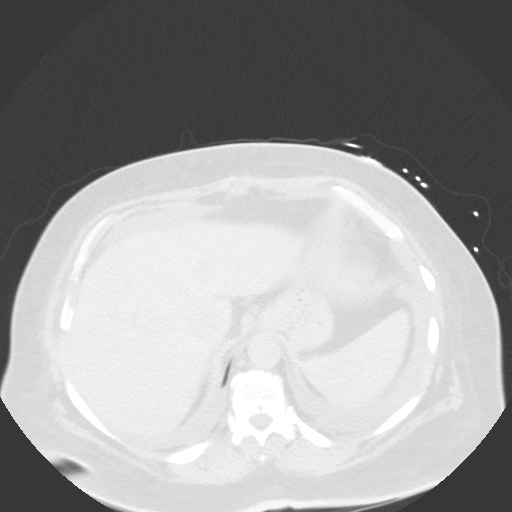
[im 26/70  mediastinal]
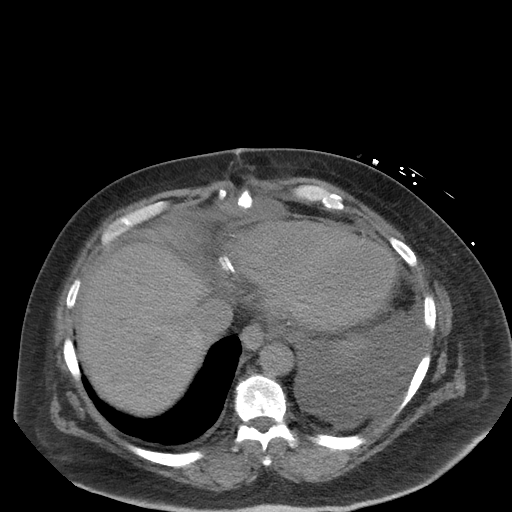
[im 26/70  lung]
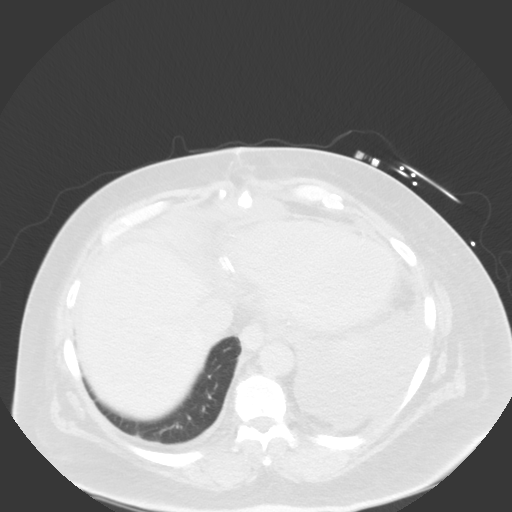
[im 31/70  lung]
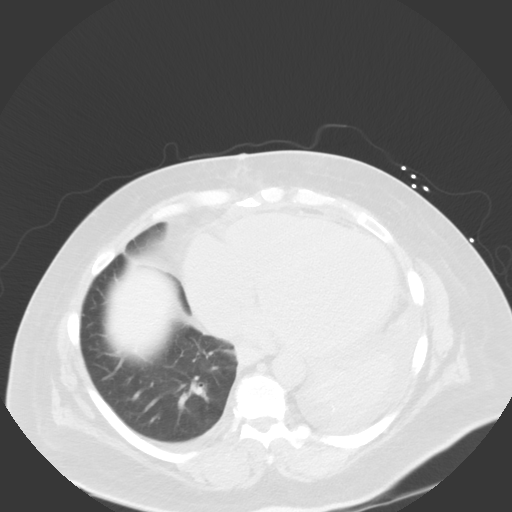
[im 39/70  lung]
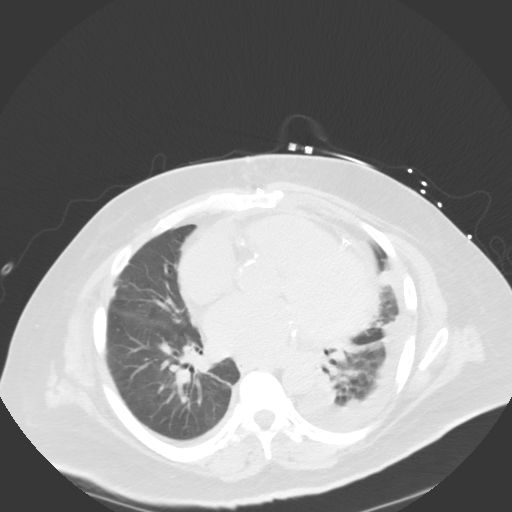
[im 44/70  lung]
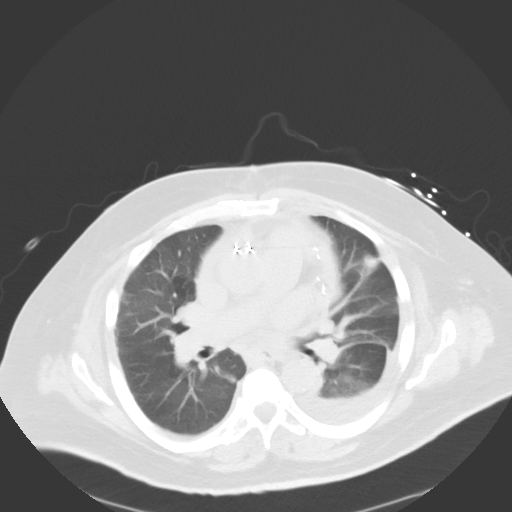
[im 49/70  mediastinal]
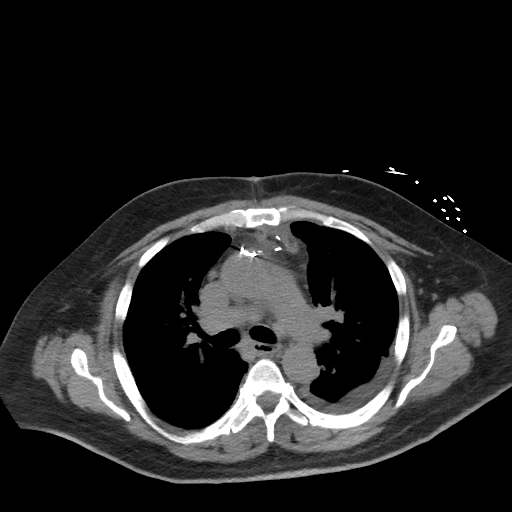
[im 49/70  lung]
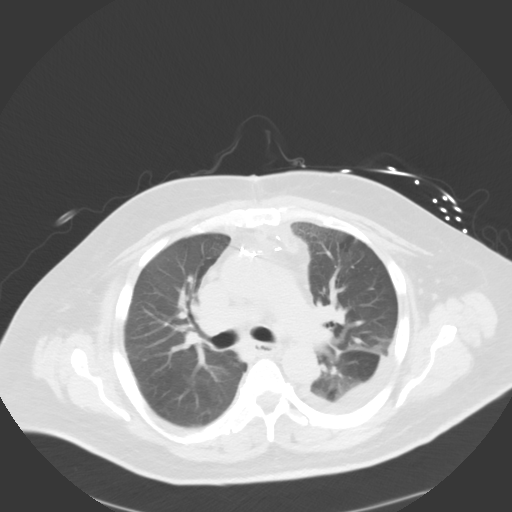
[im 54/70  lung]
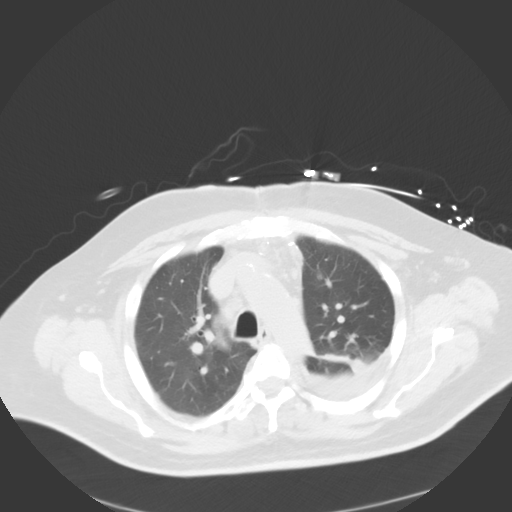
[im 59/70  lung]
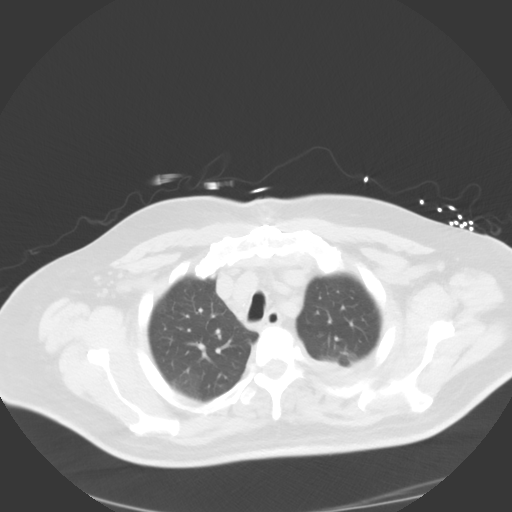
[im 64/70  lung]
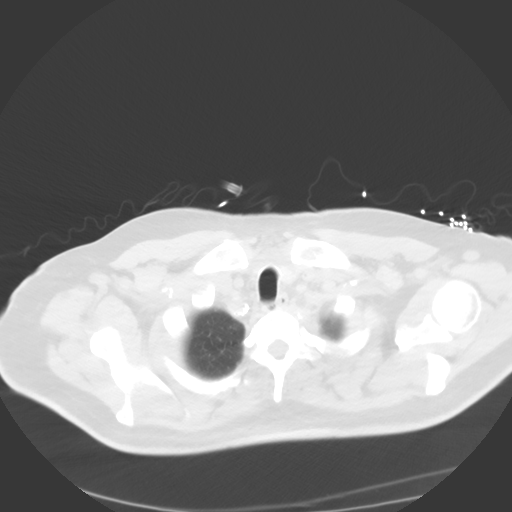

[mpr, coronal, coronal · coronal · 0.84mm/px · 3 of 89 slices shown]
[im 18/89  lung]
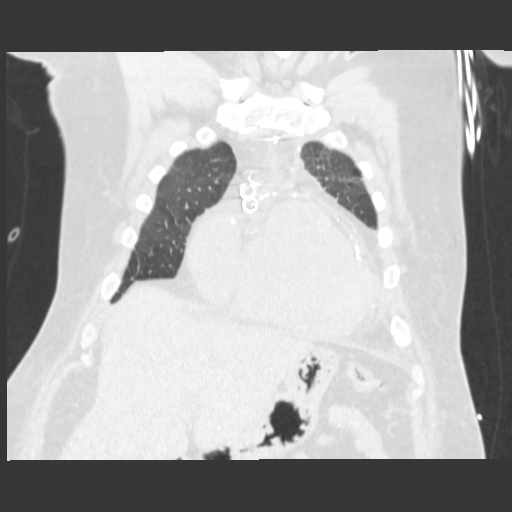
[im 36/89  lung]
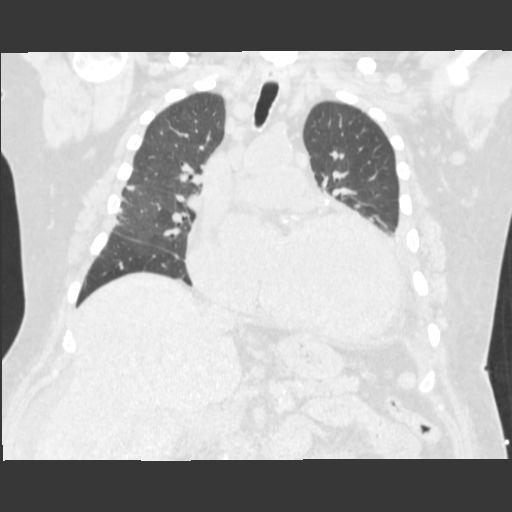
[im 53/89  lung]
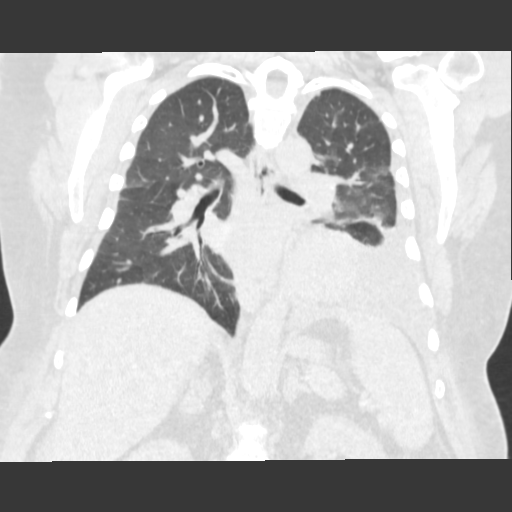

[15 of 36 positions shown; findings below may reference images not displayed]

FINDINGS: Recent median sternotomy appears intact.  There is no
evidence of bone destruction or acute fracture.  Ill-defined soft
tissue in the prevascular space measures 4.3 cm on image 23, likely
representing a small postoperative hematoma.  Previously
demonstrated prominent mediastinal lymph nodes are unchanged,
including a 1.6 cm precarinal node on image 19.  There is no
progressive adenopathy.

There are small left greater than right pleural effusions.  A trace
of pericardial fluid is present.  There is subtotal collapse of the
left lower lobe adjacent to the pleural effusion.  There is mild
lingular atelectasis.  6 mm subpleural right upper lobe nodule on
image 24 is unchanged.  There are stable small left upper lobe
nodules on images 16 and 18.

Images through the upper abdomen demonstrate a small amount of
subdiaphragmatic fluid adjacent to the liver.  3.3 cm right adrenal
myelolipoma appears unchanged.  Mild prominence of the left adrenal
gland appears stable.  There are small calcified gallstones within
the gallbladder lumen.
IMPRESSION: 1.  Postsurgical changes status post recent median sternotomy and
CABG.  There is likely a small amount of hemorrhage in the
prevascular space.
2.  Left greater than right pleural effusions with associated
subtotal left lower lobe atelectasis.  Aspiration pneumonia not
excluded.
3.  Intact median sternotomy.  No acute osseous findings.
4.  Stable mediastinal adenopathy, right adrenal myelolipoma and
pulmonary nodules.  Small calcified gallstones are noted.

## 2011-11-04 ENCOUNTER — Other Ambulatory Visit: Payer: Self-pay | Admitting: Family Medicine

## 2013-05-10 ENCOUNTER — Telehealth: Payer: Self-pay | Admitting: *Deleted

## 2013-05-10 NOTE — Telephone Encounter (Signed)
Letter sent regarding diabetes follow up care Elizabeth Katheryne Gorr, RN-BSN   

## 2013-10-08 ENCOUNTER — Telehealth: Payer: Self-pay | Admitting: *Deleted

## 2013-10-08 NOTE — Telephone Encounter (Signed)
LM for pt to call back.  He is due to be seen by his pcp.  Not seen in clinic since may 2012.  He will need fasting labs at this appt, so if he calls back please make it for the morning. Shantale Holtmeyer,CMA
